# Patient Record
Sex: Female | Born: 1960 | Race: White | Hispanic: No | Marital: Single | Smoking: Former smoker
Health system: Southern US, Community
[De-identification: ages and names within clinical notes are randomized; demographics above are authoritative.]

## PROBLEM LIST (undated history)

## (undated) DIAGNOSIS — F419 Anxiety disorder, unspecified: Secondary | ICD-10-CM

## (undated) DIAGNOSIS — K8020 Calculus of gallbladder without cholecystitis without obstruction: Secondary | ICD-10-CM

## (undated) DIAGNOSIS — K219 Gastro-esophageal reflux disease without esophagitis: Secondary | ICD-10-CM

## (undated) DIAGNOSIS — F1910 Other psychoactive substance abuse, uncomplicated: Secondary | ICD-10-CM

## (undated) DIAGNOSIS — J449 Chronic obstructive pulmonary disease, unspecified: Secondary | ICD-10-CM

## (undated) DIAGNOSIS — Z87898 Personal history of other specified conditions: Secondary | ICD-10-CM

## (undated) DIAGNOSIS — F4310 Post-traumatic stress disorder, unspecified: Secondary | ICD-10-CM

## (undated) DIAGNOSIS — M797 Fibromyalgia: Secondary | ICD-10-CM

## (undated) DIAGNOSIS — D414 Neoplasm of uncertain behavior of bladder: Secondary | ICD-10-CM

## (undated) DIAGNOSIS — M25519 Pain in unspecified shoulder: Secondary | ICD-10-CM

## (undated) DIAGNOSIS — N200 Calculus of kidney: Secondary | ICD-10-CM

## (undated) DIAGNOSIS — B009 Herpesviral infection, unspecified: Secondary | ICD-10-CM

## (undated) DIAGNOSIS — F4000 Agoraphobia, unspecified: Secondary | ICD-10-CM

## (undated) DIAGNOSIS — J439 Emphysema, unspecified: Secondary | ICD-10-CM

## (undated) DIAGNOSIS — I499 Cardiac arrhythmia, unspecified: Secondary | ICD-10-CM

## (undated) DIAGNOSIS — I10 Essential (primary) hypertension: Secondary | ICD-10-CM

## (undated) DIAGNOSIS — I2510 Atherosclerotic heart disease of native coronary artery without angina pectoris: Secondary | ICD-10-CM

## (undated) DIAGNOSIS — F32A Depression, unspecified: Secondary | ICD-10-CM

## (undated) DIAGNOSIS — M1990 Unspecified osteoarthritis, unspecified site: Secondary | ICD-10-CM

## (undated) DIAGNOSIS — K635 Polyp of colon: Secondary | ICD-10-CM

## (undated) HISTORY — DX: Pain in unspecified shoulder: M25.519

## (undated) HISTORY — DX: Essential (primary) hypertension: I10

## (undated) HISTORY — DX: Polyp of colon: K63.5

## (undated) HISTORY — DX: Chronic obstructive pulmonary disease, unspecified: J44.9

## (undated) HISTORY — DX: Atherosclerotic heart disease of native coronary artery without angina pectoris: I25.10

## (undated) HISTORY — DX: Unspecified osteoarthritis, unspecified site: M19.90

## (undated) HISTORY — DX: Anxiety disorder, unspecified: F41.9

## (undated) HISTORY — DX: Calculus of kidney: N20.0

## (undated) HISTORY — DX: Agoraphobia, unspecified: F40.00

## (undated) HISTORY — PX: APPENDECTOMY: SHX54

## (undated) HISTORY — DX: Emphysema, unspecified: J43.9

## (undated) HISTORY — DX: Gastro-esophageal reflux disease without esophagitis: K21.9

## (undated) HISTORY — DX: Post-traumatic stress disorder, unspecified: F43.10

## (undated) HISTORY — PX: URETHRAL STRICTURE DILATATION: SHX477

## (undated) HISTORY — DX: Depression, unspecified: F32.A

## (undated) HISTORY — PX: CHOLECYSTECTOMY: SHX55

## (undated) HISTORY — DX: Neoplasm of uncertain behavior of bladder: D41.4

## (undated) HISTORY — PX: WRIST FRACTURE SURGERY: SHX121

## (undated) HISTORY — PX: OTHER SURGICAL HISTORY: SHX169

## (undated) HISTORY — DX: Calculus of gallbladder without cholecystitis without obstruction: K80.20

## (undated) HISTORY — DX: Other psychoactive substance abuse, uncomplicated: F19.10

## (undated) HISTORY — DX: Fibromyalgia: M79.7

---
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
## 2006-12-24 ENCOUNTER — Encounter: Payer: Self-pay | Admitting: Cardiology

## 2007-01-02 ENCOUNTER — Ambulatory Visit: Payer: Self-pay | Admitting: Cardiology

## 2007-05-08 ENCOUNTER — Encounter: Payer: Self-pay | Admitting: Cardiology

## 2008-06-16 ENCOUNTER — Encounter: Payer: Self-pay | Admitting: Cardiology

## 2008-09-06 ENCOUNTER — Ambulatory Visit: Payer: Self-pay | Admitting: Cardiology

## 2008-09-20 ENCOUNTER — Ambulatory Visit: Payer: Self-pay | Admitting: Cardiology

## 2008-09-20 ENCOUNTER — Encounter: Payer: Self-pay | Admitting: Cardiology

## 2009-07-12 ENCOUNTER — Encounter
Admission: RE | Admit: 2009-07-12 | Discharge: 2009-07-12 | Payer: Self-pay | Admitting: Physical Medicine & Rehabilitation

## 2010-01-13 ENCOUNTER — Encounter: Payer: Self-pay | Admitting: Cardiology

## 2010-03-14 ENCOUNTER — Encounter: Payer: Self-pay | Admitting: Cardiology

## 2010-06-15 ENCOUNTER — Encounter (INDEPENDENT_AMBULATORY_CARE_PROVIDER_SITE_OTHER): Payer: Self-pay | Admitting: *Deleted

## 2010-06-15 ENCOUNTER — Ambulatory Visit: Payer: Self-pay | Admitting: Cardiology

## 2010-06-15 ENCOUNTER — Encounter: Payer: Self-pay | Admitting: Physician Assistant

## 2010-06-18 ENCOUNTER — Telehealth (INDEPENDENT_AMBULATORY_CARE_PROVIDER_SITE_OTHER): Payer: Self-pay | Admitting: *Deleted

## 2010-07-31 ENCOUNTER — Telehealth (INDEPENDENT_AMBULATORY_CARE_PROVIDER_SITE_OTHER): Payer: Self-pay | Admitting: *Deleted

## 2010-12-25 NOTE — Progress Notes (Signed)
Summary: Rescheduled cath  Phone Note Call from Patient Call back at [facility] Phone [phone]   Summary of Call: Pt scheduled for cath on 7/28. Pt called the office today requesting that cath be moved to September. She states she needs time to arrange transportation and save up some money. Pt's cath rescheduled to August 02, 2010 at 9am with Dr. Excell Seltzer. She is aware to arrive at 7am and follow the same instructions she was given at office visit last week.  Initial call taken by: Cyril Loosen, RN, BSN,  June 18, 2010 10:51 AM

## 2010-12-25 NOTE — Assessment & Plan Note (Signed)
Summary: EST-LAST SEEN 2009   Visit Type:  Follow-up Primary Provider:  Dr. Kirstie Peri   History of Present Illness: 50 year old woman presents for followup. She was last seen in the office back in 2009.  Patient presents for evaluation of chest pain. She was seen by [place] on one previous occasion, in October 2009, per Dr. Margaretmary Eddy request, for evaluation of chest pain. We deemed this to be atypical. In the context of multiple cardiac risk factors, we referred her for an adenosine stress Cardiolite test. This was reviewed by Dr. Myrtis Ser, and found to be normal; EF 53%.  Patient presents with cardiac risk factors notable for long-standing tobacco smoking, hypertension, and family history.  Of note, patient is followed regularly by a psychiatrist, for treatment of anxiety disorder. She is a difficult historian, and does not present with a clear and definitive history of her chest pain. She denies any strict correlation of these symptoms with activity or moderate exertion. She also has reflux disease, and is on a proton pump inhibitor. There is some suggestion that her symptoms have improved with this medication.  A 12-lead electrocardiogram office today indicates normal sinus rhythm at 61 bpm with normal axis and no ischemic changes.  Preventive Screening-Counseling & Management  Alcohol-Tobacco     Smoking Status: current     Smoking Cessation Counseling: yes     Packs/Day: 1 PPD  Current Medications (verified): 1)  Protonix 40 Mg Tbec (Pantoprazole Sodium) .... Take 1 Tablet By Mouth Two Times A Day 2)  Verapamil Hcl Cr 300 Mg Xr24h-Cap (Verapamil Hcl) .... Take 1 Tablet By Mouth Once A Day 3)  Aspir-Low 81 Mg Tbec (Aspirin) .... Take 1 Tablet By Mouth Once A Day 4)  Nexium 40 Mg Cpdr (Esomeprazole Magnesium) .... Take 1 Tablet By Mouth Once A Day 5)  Gelnique 10 % Gel (Oxybutynin Chloride) .... Apply Topically Daily 6)  Flavoxate Hcl 100 Mg Tabs (Flavoxate Hcl) .... Take 1 Tablet By Mouth  Three Times A Day 7)  Valacyclovir Hcl 1 Gm Tabs (Valacyclovir Hcl) .... Take 1 Tablet By Mouth Once A Day 8)  Tylenol Extra Strength 500 Mg Tabs (Acetaminophen) .... As Needed 9)  Vicodin 5-500 Mg Tabs (Hydrocodone-Acetaminophen) .... As Needed 10)  Alprazolam 1 Mg Tabs (Alprazolam) .... Take 1 Tablet By Mouth Four Times A Day 11)  Ventolin Hfa 108 (90 Base) Mcg/act Aers (Albuterol Sulfate) .... As Needed 12)  Ranitidine Hcl 150 Mg Tabs (Ranitidine Hcl) .... Take 1 Tablet By Mouth Two Times A Day 13)  Nystatin 100000 Unit/gm Oint (Nystatin) .... Apply Topically Three Times A Day 14)  Clobetasol Propionate 0.05 % Crea (Clobetasol Propionate) .... Apply Topically Two Times A Day 15)  Azo-Cranberry 450 Mg Tabs (Cranberry) .... As Needed 16)  Depo-Provera 150 Mg/ml Susp (Medroxyprogesterone Acetate) .... One Injection Every 3 Months  Allergies (verified): 1)  ! Codeine 2)  ! Advil  Comments:  Nurse/Medical Assistant: The patient's medication bottles and allergies were reviewed with the patient and were updated in the Medication and Allergy Lists.  Past History:  Past Surgical History: Last updated: 04/16/2010 Bilateral tubal ligation Appendectomy Cholecystectomy  Family History: Last updated: 04/16/2010 Family History of Coronary Artery Disease: mother  Social History: Last updated: 04/16/2010 Tobacco Use - Yes Alcohol Use - no Drug Use - no  Past Medical History: Hypertension G E R D Bladder polyps Nephrolithiasis Atypical chest pain Anxiety disorder   Social History: Packs/Day:  1 PPD  Review of Systems  No fevers, chills, hemoptysis, dysphagia, melena, hematocheezia, hematuria, rash, claudication, orthopnea, pnd, pedal edema. She also complains of swelling of her "lymph nodes", and "veins swelling up in chest". She also complains of upper and lower extremity tingling, and recurrent headaches. All other systems reviewed, and are negative.   Vital  Signs:  Patient profile:   50 year old female Height:      58 inches Weight:      119 pounds BMI:     24.96 Pulse rate:   66 / minute BP sitting:   139 / 84  (left arm) Cuff size:   regular  Vitals Entered By: Carlye Grippe (June 15, 2010 1:06 PM)  Physical Exam  Additional Exam:  GEN: 51 year old female, sitting upright, no distress HEENT: NCAT,PERRLA,EOMI NECK: palpable pulses, no bruits; no JVD; no TM LUNGS: CTA bilaterally HEART: RRR (S1S2); no significant murmurs; no rubs; no gallops ABD: soft, NT; intact BS EXT: intact distal pulses; no edema SKIN: warm, dry MUSC: no obvious deformity NEURO: A/O (x3)     Nuclear Study  Procedure date:  09/20/2008  Findings:      Adenosine Cardiolite:  No diagnostic ST segment changes. No evidence of scar or ischemia by perfusion imaging. LVEF 53%.  Nuclear Study  Procedure date:  06/15/2010  Findings:      normal sinus rhythm at 61 bpm; normal axis; no ischemic changes  Impression & Recommendations:  Problem # 1:  CHEST PAIN (ICD-786.50)  Patient presents with complaint of chest pain, but is a very vague and difficult historian. She was previously evaluated by [place] on one occasion, back in 2009, at which time we deemed her symptoms to be atypical. In the context of numerous risk factors, however, we did refer her for an adenosine Cardiolite, reviewed by Dr. Myrtis Ser, which showed normal perfusion; EF 53%. She now returns with complaint of chest pain, and states that she has had subsequent stress tests since her last visit with [place]. We discussed the option of a repeat stress test versus a diagnostic cardiac catheterization, and she opted for the latter. We explained the risks of the procedure, and she has elected to proceed. The patient was also seen and evaluated by Dr. Diona Browner, who agreed with this recommendation to proceed. 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 She also does not drive,  and transportation resources are limited. We plan on arranging this procedure to be done in the main catheterization lab, in the event that she will require percutaneous intervention. She will continue on current medication regimen, which includes low-dose aspirin. Will plan on having her return to the clinic following the procedure, for review of the study results and further recommendations.  Problem # 2:  HYPERTENSION, UNSPECIFIED (ICD-401.9) Assessment: Comment Only  Problem # 3:  TOBACCO ABUSE (ICD-305.1)  smoking cessation was advised.  Other Orders: EKG w/ Interpretation (93000) T-Basic Metabolic Panel (16109-60454) T-CBC No Diff (09811-91478) T-PTT (29562-13086) T-Protime, Auto (57846-96295) T-Chest x-ray, 2 views (28413) Cardiac Catheterization (Cardiac Cath)  Patient Instructions: 1)  Your physician has requested that you have a cardiac catheterization.  Cardiac catheterization is used to diagnose and/or treat various heart conditions. Doctors may recommend this procedure for a number of different reasons. The most common reason is to evaluate chest pain. Chest pain can be a symptom of coronary artery disease (CAD), and cardiac catheterization can show whether plaque is narrowing or blocking your heart's arteries. This procedure is also used to evaluate the  valves, as well as measure the blood flow and oxygen levels in different parts of your heart.  For further information please visit [email or web address]  Please follow instruction sheet, as given. 2)  Your physician recommends that you go to the [facility] for lab work and a chest x-ray: DO TODAY! 3)  You need to take the following medications at 6pm on 6/27, MN and 6am on 6/28: Prednisone 20mg  3 tablets, Benadryl 25mg , and Pepcid (famotidine) 20mg . This is very important because of your shrimp allergy. Prescriptions: PREDNISONE 20 MG TABS (PREDNISONE) Take 3 tablets at 6pm (6/27), MN and 6am (6/28) before procedure as directed   #9 x 0   Entered by:   Cyril Loosen, RN, BSN   Authorized by:   Nelida Meuse, PA-C   Signed by:   Cyril Loosen, RN, BSN on 06/15/2010   Method used:   Electronically to        [facility]* (retail)       [address]       [place], [place]  [address]       Ph: [phone]       Fax: [phone]   RxID:   [id number]   Appended Document: EST-LAST SEEN 2009 Patient seen with Mr. Victoria Townsend.  Symptoms somewhat atypical for angina, although with cardiac risk factors including longstanding tobacco abuse, family history, and HTN (lipid status not certain).  Prior noninvasive testing was reassuring, but symptoms continue and she remains very concerned about her heart.  We discussed the options for further evaluation, including the risks (including but not limited to bleeding/MI/stroke/death) and potential benefits of a diagnostic cardiac catherization to clearly define her coronary anatomy and assess for any symptomatic disease that might warrant intervention beyond medical therapy and risk factor modification.  She would like to proceed with a cardiac catherization after considering the matter, and this will be scheduled as an outpatient at [facility] next week.  Smoking cessation was discussed today as well.  Appended Document: EST-LAST SEEN 2009 ADDENDUM:   Patient rescheduled her previous appointment for a cardiac catheterization, and is now scheduled to present on Thursday, 9/8. Please refer to my complete note of 06/15/2010, for complete details.

## 2010-12-25 NOTE — Letter (Signed)
Summary: Cardiac Cath Instructions - Main Lab  [facility] [address]   [place], [place] [address]   Phone: [phone]  Fax: [phone]     06/15/2010 MRN: [id number]  Victoria Townsend [address]  [address] [place], [place]  [address]  Dear Victoria Townsend,   You are scheduled for Cardiac Catheterization on Thursday, June 21, 2010   with Dr. Excell Seltzer.   Please arrive at the [facility] at 0700 am      on the day of your procedure.  1. DIET     __X__ Nothing to eat or drink after midnight except your medications with a sip of water.  2. Come to the [place] office on             for lab work.  The lab at [facility] is open from 8:30 a.m. to 1:30 p.m. and 2:30 p.m. to 5:00 p.m.  The lab at [facility] is open from 7:30 a.m. to 5:30 p.m.  You do not have to be fasting.  3. MAKE SURE YOU TAKE YOUR ASPIRIN.  4. _____ DO NOT TAKE these medications before your procedure:     5. ___X__ YOU MAY TAKE ALL of your remaining medications with a small amount of water.  6._____ START NEW medications:  7.__X___ Pre-med instructions: Take Prednisone 20mg  (3 tablets), Pepcid 20mg , Benadry 25mg  at 6pm, midnight and 6am before your cath.       8. Plan for one night stay - bring personal belongings (i.e. toothpaste, toothbrush, etc.)  9. Bring a current list of your medications and current insurance cards.  10.Must have a responsible person to drive you home.   11.Someone must be with you for the first 24 hours after you arrive home.  9. Please wear clothes that are easy to get on and off and wear slip-on shoes.  *Special note: Every effort is made to have your procedure done on time.  Occasionally there are emergencies that present themselves at the hospital that may cause delays.  Please be patient if a delay does occur.  If you have any questions after you get home, please call the office at the number listed  above.  Victoria Loosen, RN, BSN

## 2010-12-25 NOTE — Letter (Signed)
Summary: [facility]-OFFICE NOTE  E[facility]-OFFICE NOTE   Imported By: Claudette Laws 03/15/2010 10:06:04  _____________________________________________________________________  External Attachment:    Type:   Image     Comment:   External Document

## 2010-12-25 NOTE — Letter (Signed)
Summary: Internal Correspondence/ FAXED OUTPATIENT CATH ORDER  Internal Correspondence/ FAXED OUTPATIENT CATH ORDER   Imported By: Dorise Hiss 08/07/2010 11:02:55  _____________________________________________________________________  External Attachment:    Type:   Image     Comment:   External Document

## 2010-12-25 NOTE — Progress Notes (Signed)
Summary: cath  Phone Note Outgoing Call   Summary of Call: Called pt Call placed by: Cyril Loosen, RN, BSN,  July 31, 2010 11:41 AM Summary of Call: Called pt at contact number in EMR/IDX to remind pt of cath scheduled for Thursday, Sept 8th. Female answered phone and stated it was the wrong number. Initial call taken by: Cyril Loosen, RN, BSN,  July 31, 2010 11:42 AM     Appended Document: cath Pt called the office stating she thinks her cath was scheduled for today, but she wasn't sure. She states she does not have transportation right now anyway and is having some other problems. She wants to cancel the cath. She states she's feeling okay right now. She states if she has any problems in the future she guesses she'll just end up in the hospital and maybe someone can help her then. Notified pt to contact office if she decides to have cath done.

## 2011-02-05 DIAGNOSIS — R079 Chest pain, unspecified: Secondary | ICD-10-CM

## 2011-02-24 HISTORY — PX: ESOPHAGOGASTRODUODENOSCOPY: SHX1529

## 2011-03-26 HISTORY — PX: COLONOSCOPY: SHX174

## 2011-04-09 NOTE — Assessment & Plan Note (Signed)
[facility]                          [facility] OFFICE NOTE   TERIANN, LIVINGOOD                        MRN:          [id number]  DATE:09/06/2008                            DOB:          Apr 29, 1961    REFERRING PHYSICIAN:  Kirstie Peri, MD   REASON FOR CONSULTATION:  Chest pain.   HISTORY OF PRESENT ILLNESS:  Ms. Nolting is a 50 year old woman with a  longstanding history of tobacco abuse and hypertension who was referred  by Dr. Sherryll Burger for the assessment of chest pain.  She actually missed her  initial visit with [place] and was rescheduled.  She reports fairly sporadic  sharp or stinging chest pain occurring either on the left or right  side of her chest and without any known precipitant.  Sometimes this  happens in the middle of the night, although it can happen during the  day as well.  These symptoms are not exertional in nature.  She reports  being compliant with her medications which include a proton pump  inhibitor and long-acting verapamil.  She has undergone previous  ischemic evaluation for similar symptoms according to her report.  She  had a myocardial perfusion study approximately a year and half ago that  demonstrated no frank ischemia with normal ejection fraction of 66%.  More recently, she underwent an echocardiogram through [facility] in August demonstrating normal left ventricular systolic  function with no valvular abnormalities and no segmental wall motion  abnormalities.  The aortic arch was described as normal as well.  Her  electrocardiogram demonstrates sinus rhythm with a very small R prime in  lead V1 and V2.  She was concerned about the status of her heart and was  referred to discuss the matter.   ALLERGIES:  Codeine.   PRESENT MEDICATIONS:  1. Aspirin 81 mg p.o. daily.  2. Omeprazole 20 mg p.o. b.i.d.  3. Verapamil SR 240 mg p.o. daily.  4. Gelnique daily.  5. Cipro 500 mg p.o. b.i.d.  6. Flavoxate 100  mg p.o. t.i.d.  7. Valtrex 500 mg 2 tablets p.o. daily.  8. Depo-Provera shots every 3 months.  9. She also on an as-needed basis uses Tylenol, hydrocodone, Xanax,      and albuterol.   PAST MEDICAL HISTORY:  Detailed above.  She reports problems with  polyps in the bladder, urinary frequency, kidney stones, kidney  cysts.  She is status post bilateral tubal ligation, appendectomy, and  cholecystectomy.  Denies any clear history of coronary artery disease or  myocardial infarction.  No dysrhythmias.   REVIEW OF SYSTEMS:  Ms. Barbee denies any palpitations, orthopnea, or  PND.  She has chronic leg pain which is nonexertional.  Also has lower  back pain, increased urinary frequency.  No fevers or chills.  Appetite  has been stable.  No melena or hematochezia.  Otherwise systems are  negative.   SOCIAL HISTORY:  The patient has smoked cigarettes since the age of 1.  She describes herself as a chain smoker.  Denies any alcohol or other  illicit substances.  FAMILY HISTORY:  Reviewed significant for coronary artery disease in her  mother who underwent coronary artery bypass grafting.  Father's history  is unknown.  All siblings described as living without any major  cardiovascular disease.   PHYSICAL EXAMINATION:  VITAL SIGNS:  Blood pressure 132/90, heart rate  is 89, weight is 139 pounds.  GENERAL:  Somewhat disheveled woman in no acute distress, short stature.  HEENT:  Conjunctiva is normal.  Pharynx clear.  NECK:  Supple.  No elevated jugular venous pressure.  No loud carotid  bruits or thyromegaly.  LUNGS:  Clear with diminished breath sounds throughout.  No wheezing or  labored breathing.  CARDIAC:  Regular rate and rhythm.  No pathologic murmurs.  No  pericardial rub, S3, or gallop.  ABDOMEN:  Soft, nontender, normoactive bowel sounds.  No bruits.  EXTREMITIES:  Exhibit 1+ pulses.  No frank pitting edema.  MUSCULOSKELETAL:  No kyphosis noted.  NEUROPSYCHIATRIC:  The  patient is alert and oriented x3.   IMPRESSION AND RECOMMENDATIONS:  Atypical chest pain in a 50 year old  woman with longstanding history of tobacco abuse and hypertension.  Lipid status is unknown at this time.  Her resting electrocardiogram is  rather nonspecific and she had a reassuring ischemic evaluation  approximately 1-1/2 years ago.  She remains very concerned about her  heart and in light of her ongoing risk factor profile, we will arrange a  followup adenosine/low-level ambulation Cardiolite on medical therapy.  If this is low risk, I would not anticipate any further cardiac  evaluation and suggest basic risk factor modification strategies.  She  clearly needs to stop smoking and we talked about this today.  It may  also be reasonable to up titrate her verapamil ER to obtain better blood  pressure and heart rate control.  We will inform her of the test results  by phone and can bring her back as needed depending on need for further  assessment.     Jonelle Sidle, MD  Electronically Signed    SGM/MedQ  DD: 09/06/2008  DT: 09/07/2008  Job #: [id number]   cc:   Kirstie Peri, MD

## 2011-05-30 ENCOUNTER — Encounter: Payer: Self-pay | Admitting: Physician Assistant

## 2012-01-12 ENCOUNTER — Encounter: Payer: Self-pay | Admitting: Cardiology

## 2012-01-12 DIAGNOSIS — K219 Gastro-esophageal reflux disease without esophagitis: Secondary | ICD-10-CM | POA: Insufficient documentation

## 2012-01-12 DIAGNOSIS — M25519 Pain in unspecified shoulder: Secondary | ICD-10-CM | POA: Insufficient documentation

## 2012-01-12 DIAGNOSIS — Z720 Tobacco use: Secondary | ICD-10-CM | POA: Insufficient documentation

## 2012-01-12 DIAGNOSIS — N200 Calculus of kidney: Secondary | ICD-10-CM | POA: Insufficient documentation

## 2012-01-12 DIAGNOSIS — F17210 Nicotine dependence, cigarettes, uncomplicated: Secondary | ICD-10-CM | POA: Insufficient documentation

## 2012-01-12 DIAGNOSIS — R0789 Other chest pain: Secondary | ICD-10-CM | POA: Insufficient documentation

## 2012-01-12 DIAGNOSIS — I10 Essential (primary) hypertension: Secondary | ICD-10-CM | POA: Insufficient documentation

## 2012-01-12 DIAGNOSIS — R9430 Abnormal result of cardiovascular function study, unspecified: Secondary | ICD-10-CM | POA: Insufficient documentation

## 2012-01-12 DIAGNOSIS — J45909 Unspecified asthma, uncomplicated: Secondary | ICD-10-CM | POA: Insufficient documentation

## 2012-01-12 DIAGNOSIS — F1910 Other psychoactive substance abuse, uncomplicated: Secondary | ICD-10-CM | POA: Insufficient documentation

## 2012-01-12 DIAGNOSIS — F419 Anxiety disorder, unspecified: Secondary | ICD-10-CM | POA: Insufficient documentation

## 2012-01-13 ENCOUNTER — Ambulatory Visit: Payer: Medicaid Other | Admitting: Cardiology

## 2012-09-30 DIAGNOSIS — R079 Chest pain, unspecified: Secondary | ICD-10-CM

## 2012-10-01 DIAGNOSIS — R072 Precordial pain: Secondary | ICD-10-CM

## 2013-03-29 ENCOUNTER — Encounter: Payer: Self-pay | Admitting: Gastroenterology

## 2013-04-12 ENCOUNTER — Ambulatory Visit (INDEPENDENT_AMBULATORY_CARE_PROVIDER_SITE_OTHER): Payer: Medicaid Other | Admitting: Gastroenterology

## 2013-04-12 ENCOUNTER — Encounter: Payer: Self-pay | Admitting: Gastroenterology

## 2013-04-12 VITALS — BP 147/70 | HR 71 | Temp 98.2°F | Ht <= 58 in | Wt 123.0 lb

## 2013-04-12 DIAGNOSIS — R109 Unspecified abdominal pain: Secondary | ICD-10-CM | POA: Insufficient documentation

## 2013-04-12 DIAGNOSIS — K5900 Constipation, unspecified: Secondary | ICD-10-CM | POA: Insufficient documentation

## 2013-04-12 DIAGNOSIS — K219 Gastro-esophageal reflux disease without esophagitis: Secondary | ICD-10-CM

## 2013-04-12 MED ORDER — DEXLANSOPRAZOLE 60 MG PO CPDR: 60.0000 mg | DELAYED_RELEASE_CAPSULE | Freq: Two times a day (BID) | ORAL | Status: DC

## 2013-04-12 MED ORDER — LUBIPROSTONE 24 MCG PO CAPS: 24.0000 ug | ORAL_CAPSULE | Freq: Two times a day (BID) | ORAL | Status: DC

## 2013-04-12 NOTE — Assessment & Plan Note (Signed)
52 year old female with chronic abdominal pain, located diffusely upper abdomen and periumbilical, intermittent, possible association with eating. No melena, use of NSAIDs, or aspirin powders. Severe GERD noted, with only improvement described with Protonix BID AND Nexium daily. Gallbladder absent, with last EGD a few years ago per her report. Need outside labs, any imaging studies, last EGD report prior to further recommendations. May ultimately need repeat EGD. For now, trial of Dexilant BID. Return in 6 weeks. Attempt to obtain all outside records in interim.

## 2013-04-12 NOTE — Assessment & Plan Note (Signed)
May be contributing to abdominal discomfort. Start Amitiza 24 mcg po BID. Obtain last TCS reports for our records. Scant hematochezia in the setting of constipation likely benign anorectal source. 6 week return.

## 2013-04-12 NOTE — Assessment & Plan Note (Signed)
Trial of Dexilant BID. 6 week f/u. GERD diet provided.

## 2013-04-12 NOTE — Patient Instructions (Addendum)
For reflux: Stop Protonix and Nexium. Start taking Dexilant twice a day. I have provided samples and sent a prescription to your pharmacy. Review the reflux diet provided.  For constipation: Start taking Amitiza 1 capsule WITH FOOD each night for three nights. IF you do well with this, increase to TWICE A DAY WITH FOOD. I have provided samples and sent a prescription to your pharmacy.  I will be requesting records regarding prior procedures such as a colonoscopy and upper endoscopy.  We will see you back in 6 weeks.   Diet for Gastroesophageal Reflux Disease, Adult Reflux (acid reflux) is when acid from your stomach flows up into the esophagus. When acid comes in contact with the esophagus, the acid causes irritation and soreness (inflammation) in the esophagus. When reflux happens often or so severely that it causes damage to the esophagus, it is called gastroesophageal reflux disease (GERD). Nutrition therapy can help ease the discomfort of GERD. FOODS OR DRINKS TO AVOID OR LIMIT  Smoking or chewing tobacco. Nicotine is one of the most potent stimulants to acid production in the gastrointestinal tract.  Caffeinated and decaffeinated coffee and black tea.  Regular or low-calorie carbonated beverages or energy drinks (caffeine-free carbonated beverages are allowed).   Strong spices, such as black pepper, white pepper, red pepper, cayenne, curry powder, and chili powder.  Peppermint or spearmint.  Chocolate.  High-fat foods, including meats and fried foods. Extra added fats including oils, butter, salad dressings, and nuts. Limit these to less than 8 tsp per day.  Fruits and vegetables if they are not tolerated, such as citrus fruits or tomatoes.  Alcohol.  Any food that seems to aggravate your condition. If you have questions regarding your diet, call your caregiver or a registered dietitian. OTHER THINGS THAT MAY HELP GERD INCLUDE:   Eating your meals slowly, in a relaxed  setting.  Eating 5 to 6 small meals per day instead of 3 large meals.  Eliminating food for a period of time if it causes distress.  Not lying down until 3 hours after eating a meal.  Keeping the head of your bed raised 6 to 9 inches (15 to 23 cm) by using a foam wedge or blocks under the legs of the bed. Lying flat may make symptoms worse.  Being physically active. Weight loss may be helpful in reducing reflux in overweight or obese adults.  Wear loose fitting clothing EXAMPLE MEAL PLAN This meal plan is approximately 2,000 calories based on https://www.bernard.org/ meal planning guidelines. Breakfast   cup cooked oatmeal.  1 cup strawberries.  1 cup low-fat milk.  1 oz almonds. Snack  1 cup cucumber slices.  6 oz yogurt (made from low-fat or fat-free milk). Lunch  2 slice whole-wheat bread.  2 oz sliced [place].  2 tsp mayonnaise.  1 cup blueberries.  1 cup snap peas. Snack  6 whole-wheat crackers.  1 oz string cheese. Dinner   cup brown rice.  1 cup mixed veggies.  1 tsp olive oil.  3 oz grilled fish. Document Released: 11/11/2005 Document Revised: 02/03/2012 Document Reviewed: 09/27/2011 [facility] Patient Information 2013 [place], [place].

## 2013-04-12 NOTE — Progress Notes (Signed)
Cc PCP 

## 2013-04-12 NOTE — Progress Notes (Addendum)
Primary Care Physician:  Kirstie Peri, MD Primary Gastroenterologist:  Dr. Jena Gauss  Chief Complaint  Patient presents with  . Abdominal Pain    HPI:   52 year old eccentric female presents today at the request of Dr. Sherryll Burger secondary to abdominal pain. It is quite difficult to keep her on track. Notes multiple complaints today, many of them non-GI related. Continues to have "heart attack pain" but negative cardiac work-up. Chronic abdominal pain X 1 year. Feels like it is getting worse. Upper abdominal pain diffusely, periumbilical pain within the last 4 months. Tenderness/soreness upper abdomen, feels like "something sitting there". Intermittent. Feels like she overeats sometimes and becomes bloated, worsens reflux. On Nexium daily, AND Protonix BID. Eating aggravates abdominal discomfort. Vague nausea, no vomiting. No melena. Scant hematochezia with constipation. Fiber for constipation. OTC agents do not help. "balls" of stool. Last cocaine about 4 months ago.  Significant psychological history, sees [facility]. Reportedly, she had a TCS/EGD by Dr. Allena Katz a few years ago. Records not available at time of this encounter.    Past Medical History  Diagnosis Date  . GERD (gastroesophageal reflux disease)   . Hypertension   . Bladder polyps   . Nephrolithiasis   . Chest pain     Nuclear, 2009, no ischemia, EF 53%  . Anxiety disorder     Historically has seen a psychiatrist  . Ejection fraction     EF 53%, nuclear, 2009  . Tobacco abuse   . Asthma   . Shoulder pain     Shoulder has been injected  . Polysubstance abuse     cocaine, marijuana  . Agoraphobia   . PTSD (post-traumatic stress disorder)     Past Surgical History  Procedure Laterality Date  . Bilateral tubal ligation    . Appendectomy    . Cholecystectomy    . Bladder polyps    . Urethral stricture dilatation      Current Outpatient Prescriptions  Medication Sig Dispense Refill  . acetaminophen (TYLENOL) 500 MG tablet  Take 500 mg by mouth every 6 (six) hours as needed.        Marland Kitchen albuterol (VENTOLIN HFA) 108 (90 BASE) MCG/ACT inhaler Inhale 2 puffs into the lungs every 6 (six) hours as needed.        . ALPRAZolam (XANAX) 1 MG tablet Take 1 mg by mouth 4 (four) times daily.        Marland Kitchen aspirin (ASPIR-LOW) 81 MG EC tablet Take 81 mg by mouth daily.        . clobetasol (TEMOVATE) 0.05 % cream Apply topically 2 (two) times daily.        . Cranberry (AZO-CRANBERRY) 450 MG TABS Take by mouth as needed.        Marland Kitchen esomeprazole (NEXIUM) 40 MG packet Take 40 mg by mouth daily before breakfast.        . flavoxATE (URISPAS) 100 MG tablet Take 100 mg by mouth 3 (three) times daily as needed.        . nystatin (MYCOSTATIN) ointment Apply topically 3 (three) times daily.        . Oxybutynin Chloride (GELNIQUE) 10 % GEL Place onto the skin daily.        . pantoprazole (PROTONIX) 40 MG tablet Take 40 mg by mouth 2 (two) times daily.        . polyethylene glycol (MIRALAX / GLYCOLAX) packet Take 17 g by mouth daily.      . predniSONE (STERAPRED UNI-PAK) 5 MG TABS Take 5  mg by mouth daily.      . valACYclovir (VALTREX) 1000 MG tablet Take 1,000 mg by mouth daily.        . Verapamil HCl CR 300 MG CP24 Take 1 capsule by mouth daily.        Marland Kitchen dexlansoprazole (DEXILANT) 60 MG capsule Take 1 capsule (60 mg total) by mouth 2 (two) times daily.  60 capsule  3  . lubiprostone (AMITIZA) 24 MCG capsule Take 1 capsule (24 mcg total) by mouth 2 (two) times daily with a meal.  60 capsule  3   No current facility-administered medications for this visit.    Allergies as of 04/12/2013 - Review Complete 04/12/2013  Allergen Reaction Noted  . Ciprofloxacin Swelling 04/12/2013  . Codeine    . Ibuprofen    . Shellfish allergy Rash 04/12/2013    Family History  Problem Relation Age of Onset  . Coronary artery disease Mother   . Colon cancer Paternal Uncle     History   Social History  . Marital Status: Divorced    Spouse Name: N/A     Number of Children: N/A  . Years of Education: N/A   Occupational History  . Not on file.   Social History Main Topics  . Smoking status: Current Some Day Smoker -- 0.50 packs/day    Types: Cigarettes  . Smokeless tobacco: Not on file  . Alcohol Use: No     Comment: former user  . Drug Use: Yes    Special: Marijuana, Cocaine     Comment: last cocaine about 4 months ago; actively smoking marijuana  . Sexually Active: Not on file   Other Topics Concern  . Not on file   Social History Narrative  . No narrative on file    Review of Systems: Negative unless mentioned in HPI  Physical Exam: BP 147/70  Pulse 71  Temp(Src) 98.2 F (36.8 C) (Oral)  Ht 4\' 10"  (1.473 m)  Wt 123 lb (55.792 kg)  BMI 25.71 kg/m2 General:   Alert and oriented. Pleasant and cooperative. Well-nourished and well-developed.  Head:  Normocephalic and atraumatic. Eyes:  Without icterus, sclera clear and conjunctiva pink.  Ears:  Normal auditory acuity. Nose:  No deformity, discharge,  or lesions. Mouth:  No deformity or lesions, oral mucosa pink.  Neck:  Supple, without mass or thyromegaly. Lungs:  Clear to auscultation bilaterally. No wheezes, rales, or rhonchi. No distress.  Heart:  S1, S2 present without murmurs appreciated.  Abdomen:  +BS, soft, non-tender and non-distended. No HSM noted. No guarding or rebound. No masses appreciated.  Rectal:  Deferred  Msk:  Symmetrical without gross deformities. Normal posture. Extremities:  Without clubbing or edema. Neurologic:  Alert and  oriented x4;  grossly normal neurologically. Skin:  Intact without significant lesions or rashes. Cervical Nodes:  No significant cervical adenopathy. Psych:  Alert and cooperative. Eccentric, talkative, hard to keep on track.

## 2013-04-15 ENCOUNTER — Telehealth: Payer: Self-pay | Admitting: Gastroenterology

## 2013-04-15 NOTE — Telephone Encounter (Signed)
Pt called this afternoon to say that she was not taking the medications that were prescribed to her. She read the side effects of Amitiza and Dexilant and said she was not going to take them. Pt is confused why Dr Sherryll Burger referred her to GI and said she didn't see why she needed to follow up. I told patient that I would note her concerns and it would be up to her if she wanted to follow up with [place].

## 2013-04-26 NOTE — Telephone Encounter (Signed)
Can we find out how patient is doing and if she has any further concerns.

## 2013-04-27 NOTE — Telephone Encounter (Signed)
Called pt. She is not taking the Dexilant or the Amitiza. She is taking the Protonix bid. She is not taking the Nexium. She said she has been doing well on the Protonix bid and not having a lot of reflux.   She is not taking the Amitiza because her stools got loose and also she is afraid of a lot of medications because of the side effects.   She is taking fiber and she has a BM ( sometimes 2) daily. She said she had a normal soft this AM and then had a very loose one.   She feels she is doing much better, because she was having to take laxatives daily to have a BM.   She is aware of her OV appt with Gerrit Halls, NP on 05/24/2013 at 11:00 Am and she plans to keep that appt.

## 2013-04-27 NOTE — Telephone Encounter (Signed)
Routing to Doris, this is a [facility] pt. 

## 2013-04-27 NOTE — Telephone Encounter (Signed)
Glad to hear. 

## 2013-05-05 ENCOUNTER — Encounter: Payer: Self-pay | Admitting: Gastroenterology

## 2013-05-05 NOTE — Progress Notes (Signed)
Last TCS 2012 by Dr. Allena Katz: pancolonic diverticula.

## 2013-05-06 ENCOUNTER — Encounter: Payer: Self-pay | Admitting: Gastroenterology

## 2013-05-24 ENCOUNTER — Ambulatory Visit (INDEPENDENT_AMBULATORY_CARE_PROVIDER_SITE_OTHER): Payer: Medicaid Other | Admitting: Gastroenterology

## 2013-05-24 ENCOUNTER — Encounter: Payer: Self-pay | Admitting: Gastroenterology

## 2013-05-24 VITALS — BP 133/71 | HR 58 | Temp 97.8°F | Ht 63.0 in | Wt 121.8 lb

## 2013-05-24 DIAGNOSIS — K5900 Constipation, unspecified: Secondary | ICD-10-CM

## 2013-05-24 DIAGNOSIS — K219 Gastro-esophageal reflux disease without esophagitis: Secondary | ICD-10-CM

## 2013-05-24 DIAGNOSIS — R1013 Epigastric pain: Secondary | ICD-10-CM

## 2013-05-24 MED ORDER — DIPHENHYDRAMINE HCL 25 MG PO TABS: ORAL_TABLET | ORAL | Status: DC

## 2013-05-24 MED ORDER — PREDNISONE 50 MG PO TABS: ORAL_TABLET | ORAL | Status: DC

## 2013-05-24 NOTE — Patient Instructions (Addendum)
Please have blood work done. We will call with results.  We have scheduled you for a CT scan to further evaluate your pain.  IMPORTANT: Take 1 tablet of Prednisone 13 hours before scan Take 1 tablet of Prednisone 7 hours before scan Take 1 tablet of Prednisone and 50 mg of Benadryl 1 hour before scan.    Further recommendations to follow.

## 2013-05-24 NOTE — Progress Notes (Signed)
Referring Provider: Kirstie Peri, MD Primary Care Physician:  Kirstie Peri, MD  Chief Complaint  Patient presents with  . Follow-up    abd pain    HPI:   52 year old female returns today in follow-up with a history of chronic abdominal pain, constipation, severe GERD. Started on Dexilant at last appointment. However, patient called several days later saying she was doing well on just Protonix BID. Taking fiber for constipation with good results.  Last TCS in 2012 by Dr. Allena Katz with pancolonic diverticula. EGD 2012 with gastritis.   Ketchup, mayonnaise, fish stick sandwiches, worsened constipation. Took fiber, felt like abdomen was swollen and distended. Took 2 blue laxative pills yesterday, drank coffee this morning, then started having bowel movements. Had small, hard bowel movements then "creamy runs" then more hard bowel movements.   Notes epigastric soreness, "like a vein or a knot", felt like it moved upwards 6 months later. Stings like a bumble bee epigastric region. Severe. Constant today. Eating too much worsens pain. Gallbladder absent. No melena. No NSAIDs. No dysphagia. No loss of appetite or weight loss. Reflux under better control.  No longer taking Nexium. Protonix BID. Feels like she still has breakthrough reflux.   Past Medical History  Diagnosis Date  . GERD (gastroesophageal reflux disease)   . Hypertension   . Bladder polyps   . Nephrolithiasis   . Chest pain     Nuclear, 2009, no ischemia, EF 53%  . Anxiety disorder     Historically has seen a psychiatrist  . Ejection fraction     EF 53%, nuclear, 2009  . Tobacco abuse   . Asthma   . Shoulder pain     Shoulder has been injected  . Polysubstance abuse     cocaine, marijuana  . Agoraphobia   . PTSD (post-traumatic stress disorder)     Past Surgical History  Procedure Laterality Date  . Bilateral tubal ligation    . Appendectomy    . Cholecystectomy    . Bladder polyps    . Urethral stricture dilatation     . Colonoscopy  May 2012    Dr. Allena Katz: scattered diverticula pancolonic  . Esophagogastroduodenoscopy  April 2012    Dr. Allena Katz: gastritis    Current Outpatient Prescriptions  Medication Sig Dispense Refill  . acetaminophen (TYLENOL) 500 MG tablet Take 500 mg by mouth every 6 (six) hours as needed.        Marland Kitchen albuterol (VENTOLIN HFA) 108 (90 BASE) MCG/ACT inhaler Inhale 2 puffs into the lungs every 6 (six) hours as needed.        . ALPRAZolam (XANAX) 1 MG tablet Take 1 mg by mouth 4 (four) times daily.        Marland Kitchen aspirin (ASPIR-LOW) 81 MG EC tablet Take 81 mg by mouth daily.        . clobetasol (TEMOVATE) 0.05 % cream Apply topically 2 (two) times daily.        . Cranberry (AZO-CRANBERRY) 450 MG TABS Take by mouth as needed.        . flavoxATE (URISPAS) 100 MG tablet Take 100 mg by mouth 3 (three) times daily as needed.        . nystatin (MYCOSTATIN) ointment Apply topically 3 (three) times daily.        . Oxybutynin Chloride (GELNIQUE) 10 % GEL Place onto the skin daily.        . pantoprazole (PROTONIX) 40 MG tablet Take 40 mg by mouth 2 (two) times daily.        Marland Kitchen  predniSONE (STERAPRED UNI-PAK) 5 MG TABS Take 5 mg by mouth daily.      . valACYclovir (VALTREX) 1000 MG tablet Take 1,000 mg by mouth daily.        . Verapamil HCl CR 300 MG CP24 Take 1 capsule by mouth daily.        . diphenhydrAMINE (BENADRYL) 25 MG tablet Take 2 tablets 1 hour before CT scan.  2 tablet  0  . predniSONE (DELTASONE) 50 MG tablet Take 1 tablet 13 hours before scan, 1 tablet 7 hours before, and 1 tablet 1 hour before CT scan.  3 tablet  0   No current facility-administered medications for this visit.    Allergies as of 05/24/2013 - Review Complete 05/24/2013  Allergen Reaction Noted  . Ciprofloxacin Swelling 04/12/2013  . Codeine    . Ibuprofen    . Shellfish allergy Rash 04/12/2013    Family History  Problem Relation Age of Onset  . Coronary artery disease Mother   . Colon cancer Paternal Uncle      History   Social History  . Marital Status: Divorced    Spouse Name: N/A    Number of Children: N/A  . Years of Education: N/A   Social History Main Topics  . Smoking status: Current Some Day Smoker -- 0.50 packs/day    Types: Cigarettes  . Smokeless tobacco: None  . Alcohol Use: No     Comment: former user  . Drug Use: Yes    Special: Marijuana, Cocaine     Comment: last cocaine about 4 months ago; actively smoking marijuana  . Sexually Active: None   Other Topics Concern  . None   Social History Narrative  . None    Review of Systems: Negative unless mentioned in HPI.  Physical Exam: BP 133/71  Pulse 58  Temp(Src) 97.8 F (36.6 C) (Oral)  Ht 5\' 3"  (1.6 m)  Wt 121 lb 12.8 oz (55.248 kg)  BMI 21.58 kg/m2 General:   Alert and oriented. No distress noted. Eccentric, very talkative Head:  Normocephalic and atraumatic. Eyes:  Conjuctiva clear without scleral icterus. Mouth:  Oral mucosa pink and moist. Poor dentition Neck:  Supple, without mass or thyromegaly. Heart:  S1, S2 present without murmurs, rubs, or gallops. Regular rate and rhythm. Abdomen:  +BS, soft, non-tender and non-distended. No rebound or guarding. No HSM or masses noted. Msk:  Symmetrical without gross deformities. Normal posture. Extremities:  Without edema. Neurologic:  Alert and  oriented x4;  grossly normal  Psych:  Alert and cooperative. Talkative, almost flight-of-ideas

## 2013-05-25 ENCOUNTER — Encounter: Payer: Self-pay | Admitting: Cardiology

## 2013-05-26 ENCOUNTER — Encounter: Payer: Self-pay | Admitting: Gastroenterology

## 2013-05-26 DIAGNOSIS — R1013 Epigastric pain: Secondary | ICD-10-CM | POA: Insufficient documentation

## 2013-05-26 NOTE — Assessment & Plan Note (Signed)
Chronic GERD, last EGD with gastritis in 2012 by Dr. Allena Katz. No dysphagia. Patient with mixed reports regarding symptoms, with phone notes noting improvement with Protonix BID, now stating still having breakthrough GERD. Did not take Dexilant as prescribed at last visit. Pt quite eccentric and difficult to redirect during conversation. Continue Protonix BID for now; see epigastric pain.

## 2013-05-26 NOTE — Progress Notes (Signed)
Cc PCP 

## 2013-05-26 NOTE — Assessment & Plan Note (Signed)
CHRONIC, in the setting of constipation and chronic GERD. Last EGD in 2012 benign, gallbladder absent. Question of chronic abdominal pain; will update blood work to include CBC, HFP, BMP. Discussed possibility of updated EGD, as last was performed at outpatient facility. Patient is requesting CT scan, which we will schedule. She seems to believe a "vein" or superficial "blood clot" is moving around in her abdomen. Further recommendations after CT.

## 2013-05-26 NOTE — Assessment & Plan Note (Signed)
Likely multifactorial and exacerbated by dietary intake (i.e. Fish stick sandwiches). Amitiza prescribed at last visit but patient refused to take due to side effects that she read. Continue supplemental fiber for now. Further recommendations after CT.

## 2013-05-27 ENCOUNTER — Ambulatory Visit: Payer: Medicaid Other | Admitting: Cardiology

## 2013-05-31 ENCOUNTER — Ambulatory Visit (HOSPITAL_COMMUNITY)
Admission: RE | Admit: 2013-05-31 | Discharge: 2013-05-31 | Disposition: A | Discharge status: Home / Self Care | Payer: Medicaid Other | Source: Ambulatory Visit | Attending: Gastroenterology | Admitting: Gastroenterology

## 2013-05-31 DIAGNOSIS — R1013 Epigastric pain: Secondary | ICD-10-CM | POA: Insufficient documentation

## 2013-05-31 DIAGNOSIS — N200 Calculus of kidney: Secondary | ICD-10-CM | POA: Insufficient documentation

## 2013-05-31 MED ORDER — IOHEXOL 300 MG/ML  SOLN
100.0000 mL | Freq: Once | INTRAMUSCULAR | Status: AC | PRN
  Administered 2013-05-31: 100 mL via INTRAVENOUS

## 2013-06-03 ENCOUNTER — Telehealth: Payer: Self-pay | Admitting: Gastroenterology

## 2013-06-03 NOTE — Telephone Encounter (Signed)
Pt called this afternoon asking for her CT results that she had done on 05/31/13. I told her that if results were available that the nurse would be calling her. [phone]

## 2013-06-03 NOTE — Telephone Encounter (Signed)
To AS.   

## 2013-06-03 NOTE — Telephone Encounter (Signed)
Routing to Anna Sams, NP. 

## 2013-06-08 NOTE — Telephone Encounter (Signed)
Please see result note 

## 2013-06-08 NOTE — Progress Notes (Signed)
Quick Note:  She has increased stool in her colon, consistent with constipation.  I had prescribed Amitiza at last visit, but she refused to take this. I suggest restarting this. We need to make sure her abdominal pain is not stemming from chronic constipation. Offer f/u visit to discuss possible EGD.   ______

## 2013-06-09 NOTE — Progress Notes (Signed)
Quick Note:  Pt returned call. I informed her of results and recommendations. She has not taken the Amitiza or the Dexilant. I told her to take the Amitiza ( she has 24 mcg samples). I told her to take one daily with food. She had already taken some fiber therapy this Am and wanted to know if it was still ok to take and I told her yes. She said she gets very constipated when she eats solid food. She did not take the Dexilant because she is afraid to. She said she feels like something else is going on with her besides constipation. I scheduled an OV with her for 06/29/2013 at 10:30 with Gerrit Halls, NP to discuss EGD also. ______

## 2013-06-09 NOTE — Progress Notes (Signed)
Quick Note:  Called. Many rings and no answer. ______ 

## 2013-06-10 ENCOUNTER — Telehealth: Payer: Self-pay

## 2013-06-10 MED ORDER — LUBIPROSTONE 24 MCG PO CAPS: 24.0000 ug | ORAL_CAPSULE | Freq: Two times a day (BID) | ORAL | Status: DC

## 2013-06-10 NOTE — Telephone Encounter (Signed)
Pt returned call and was informed to take the Amitiza bid with food. She would like a prescription sent to [facility] so when she runs out of samples. She was also complaining of her left arm twitching some and her back hurting and I told her that she should follow up with PCP for those issues.

## 2013-06-10 NOTE — Telephone Encounter (Signed)
Pt called and said she took the Amitiza bid yesterday and still only had balls of BM this AM and her stomach is swollen. I told her I would check with Gerrit Halls, NP and see what she recommends. I spoke to Tobi Bastos and she said OK for pt to take bid, and make sure she takes it with food. Pt was going to take a shower and call me back about 9:00 AM.

## 2013-06-10 NOTE — Telephone Encounter (Signed)
Done. Agree with need to see PCP for non-GI issues.

## 2013-06-23 ENCOUNTER — Encounter: Payer: Self-pay | Admitting: Cardiology

## 2013-06-24 ENCOUNTER — Ambulatory Visit (INDEPENDENT_AMBULATORY_CARE_PROVIDER_SITE_OTHER): Payer: Medicaid Other | Admitting: Cardiology

## 2013-06-24 ENCOUNTER — Encounter: Payer: Self-pay | Admitting: Cardiology

## 2013-06-24 VITALS — BP 131/83 | HR 73 | Ht <= 58 in | Wt 117.1 lb

## 2013-06-24 DIAGNOSIS — R0789 Other chest pain: Secondary | ICD-10-CM

## 2013-06-24 DIAGNOSIS — I10 Essential (primary) hypertension: Secondary | ICD-10-CM

## 2013-06-24 DIAGNOSIS — F1910 Other psychoactive substance abuse, uncomplicated: Secondary | ICD-10-CM

## 2013-06-24 NOTE — Assessment & Plan Note (Signed)
Discussed symptoms with the patient today. This has been a recurring problem for years. She has had reassuring noninvasive cardiac evaluations including a normal stress echocardiogram within the last year. ECG shows no acute ST segment changes. Her symptoms are atypical for ischemic heart disease. She describes a pleuritic component, a feeling of burning in her chest, also feeling as if her "lymph nodes" are enlarged in her chest region. No further cardiac testing is planned at this time. I recommended that she discuss with Dr. Sherryll Burger the possibility of CT imaging of her chest, and searching for other noncardiac etiologies.

## 2013-06-24 NOTE — Progress Notes (Signed)
Clinical Summary Victoria Townsend is a 52 y.o.female referred for cardiology consultation by Dr. Sherryll Burger. She was seen previously in 2009 with atypical chest pain symptoms. Adenosine Cardiolite in October 2009 was negative for ischemia. More recently she underwent an exercise echocardiogram that was also negative for ischemia in November 2013. She has no defined history of obstructive CAD.  She reports intermittent, sudden onset, sharp discomfort usually on the left side of her chest, seems to take her breath away, feels worse to breathe in deeply, also describes a burning sensation in her back when she breathes in. Feels like her "lymph nodes" are swollen in her chest. There is no reproducible exertional component to her chest pain. She has had intermittent symptoms for several years.  ECG today shows normal sinus rhythm, no acute ST segment changes.   Allergies  Allergen Reactions  . Ciprofloxacin Swelling    Facial swelling  . Codeine     REACTION: n/v  . Ibuprofen   . Shellfish Allergy Rash    Current Outpatient Prescriptions  Medication Sig Dispense Refill  . acetaminophen (TYLENOL) 500 MG tablet Take 500 mg by mouth every 6 (six) hours as needed.        Marland Kitchen albuterol (VENTOLIN HFA) 108 (90 BASE) MCG/ACT inhaler Inhale 2 puffs into the lungs every 6 (six) hours as needed.        . ALPRAZolam (XANAX) 0.5 MG tablet Take 0.5 mg by mouth 5 (five) times daily.      Marland Kitchen aspirin (ASPIR-LOW) 81 MG EC tablet Take 81 mg by mouth every other day.       . clobetasol (TEMOVATE) 0.05 % cream Apply topically 2 (two) times daily.        Marland Kitchen conjugated estrogens (PREMARIN) vaginal cream Place 1 g vaginally daily.      . Cranberry (AZO-CRANBERRY) 450 MG TABS Take by mouth as needed.        . diphenhydrAMINE (BENADRYL) 25 MG tablet Take 2 tablets 1 hour before CT scan.  2 tablet  0  . nystatin (MYCOSTATIN) ointment Apply topically daily.       . Oxybutynin Chloride (GELNIQUE) 10 % GEL Place onto the skin daily.         . pantoprazole (PROTONIX) 40 MG tablet Take 40 mg by mouth 2 (two) times daily.        . predniSONE (DELTASONE) 50 MG tablet Take 1 tablet 13 hours before scan, 1 tablet 7 hours before, and 1 tablet 1 hour before CT scan.  3 tablet  0  . valACYclovir (VALTREX) 1000 MG tablet Take 1,000 mg by mouth as needed.       . Verapamil HCl CR 300 MG CP24 Take 1 capsule by mouth daily.         No current facility-administered medications for this visit.    Past Medical History  Diagnosis Date  . GERD (gastroesophageal reflux disease)   . Essential hypertension, benign   . Bladder polyps   . Nephrolithiasis   . Chest pain     Cardiolite 2009, no ischemia, EF 53%  . Anxiety disorder     Historically has seen a psychiatrist  . Asthma   . Shoulder pain     Shoulder has been injected  . Polysubstance abuse     History of cocaine, marijuana  . Agoraphobia   . PTSD (post-traumatic stress disorder)     Past Surgical History  Procedure Laterality Date  . Cesarean section with bilateral tubal ligation    .  Appendectomy    . Cholecystectomy    . Bladder polyps    . Urethral stricture dilatation    . Colonoscopy  May 2012    Dr. Allena Katz: scattered diverticula pancolonic  . Esophagogastroduodenoscopy  April 2012    Dr. Allena Katz: gastritis    Family History  Problem Relation Age of Onset  . Coronary artery disease Mother     CABG  . Colon cancer Paternal Uncle     Social History Ms. Cassara reports that she has been smoking Cigarettes.  She has been smoking about 0.50 packs per day. She does not have any smokeless tobacco history on file. Ms. Florance reports that she does not drink alcohol.  Review of Systems Also has intermittent abdominal discomfort, chronic constipation. Still smoking cigarettes. No hemoptysis, fevers or chills. No orthopnea or PND.  Physical Examination Filed Vitals:   06/24/13 1302  BP: 131/83  Pulse: 73   Filed Weights   06/24/13 1302  Weight: 117 lb 1.9 oz  (53.125 kg)   Patient appears comfortable at rest. HEENT: Conjunctiva and lids normal, oropharynx clear with poor dentition. Neck: Supple, no elevated JVP or carotid bruits, no thyromegaly. Lungs: Diminished breath sounds but clear to auscultation, nonlabored breathing at rest. Cardiac: Regular rate and rhythm, no S3 or significant systolic murmur, no pericardial rub. Abdomen: Soft, nontender, bowel sounds present. Extremities: No pitting edema, distal pulses 2+. Skin: Warm and dry. Musculoskeletal: No kyphosis. Neuropsychiatric: Alert and oriented x3, affect grossly appropriate.   Problem List and Plan   Atypical chest pain Discussed symptoms with the patient today. This has been a recurring problem for years. She has had reassuring noninvasive cardiac evaluations including a normal stress echocardiogram within the last year. ECG shows no acute ST segment changes. Her symptoms are atypical for ischemic heart disease. She describes a pleuritic component, a feeling of burning in her chest, also feeling as if her "lymph nodes" are enlarged in her chest region. No further cardiac testing is planned at this time. I recommended that she discuss with Dr. Sherryll Burger the possibility of CT imaging of her chest, and searching for other noncardiac etiologies.  Polysubstance abuse Patient states she is no longer using crack cocaine, occasionally uses marijuana. Still smoking cigarettes. We discussed lifestyle modification and risk reduction today to reduce her risk of adverse cardiac events in the future.  Essential hypertension, benign Followed by Dr. Sherryll Burger.    Jonelle Sidle, M.D., F.A.C.C.

## 2013-06-24 NOTE — Assessment & Plan Note (Addendum)
Patient states she is no longer using crack cocaine, occasionally uses marijuana. Still smoking cigarettes. We discussed lifestyle modification and risk reduction today to reduce her risk of adverse cardiac events in the future.

## 2013-06-24 NOTE — Patient Instructions (Addendum)
   No further cardiac work up needed. Please follow up with your family doctor. Your physician recommends that you continue on your current medications as directed. Please refer to the Current Medication list given to you today.

## 2013-06-24 NOTE — Assessment & Plan Note (Signed)
Followed by Dr. Shah. 

## 2013-06-29 ENCOUNTER — Ambulatory Visit: Payer: Medicaid Other | Admitting: Gastroenterology

## 2013-07-02 ENCOUNTER — Telehealth: Payer: Self-pay | Admitting: Cardiology

## 2013-07-02 NOTE — Telephone Encounter (Signed)
Please call patient in reference to having a CAT scan of her chest done.  Said Dr shah's office told her to have order sent to them????

## 2013-07-02 NOTE — Telephone Encounter (Signed)
Called and informed Tobi Bastos at Dr. Margaretmary Eddy office of [facility]'s recommendations about patient getting CT chest handled through PCP office. Patient informed and verbalized understanding of plan.

## 2013-07-08 ENCOUNTER — Encounter: Payer: Self-pay | Admitting: *Deleted

## 2013-07-08 NOTE — Telephone Encounter (Signed)
This encounter was created in error - please disregard.

## 2013-08-03 ENCOUNTER — Ambulatory Visit (INDEPENDENT_AMBULATORY_CARE_PROVIDER_SITE_OTHER): Payer: Medicaid Other | Admitting: Gastroenterology

## 2013-08-03 ENCOUNTER — Encounter: Payer: Self-pay | Admitting: Gastroenterology

## 2013-08-03 VITALS — BP 123/77 | HR 65 | Temp 97.6°F | Ht <= 58 in | Wt 120.4 lb

## 2013-08-03 DIAGNOSIS — K5900 Constipation, unspecified: Secondary | ICD-10-CM

## 2013-08-03 DIAGNOSIS — R109 Unspecified abdominal pain: Secondary | ICD-10-CM

## 2013-08-03 MED ORDER — LINACLOTIDE 290 MCG PO CAPS: 290.0000 ug | ORAL_CAPSULE | Freq: Every day | ORAL | Status: DC

## 2013-08-03 NOTE — Patient Instructions (Addendum)
Continue fiber therapy. Start taking Linzess 1 capsule each morning, 30 minutes before breakfast. Do not take with food.   We have scheduled you for an upper endoscopy with Dr. Jena Gauss in the near future. Further recommendations to follow.

## 2013-08-03 NOTE — Progress Notes (Signed)
Referring Provider: Kirstie Peri, MD Primary Care Physician:  Kirstie Peri, MD Primary GI: Dr. Jena Gauss   Chief Complaint  Patient presents with  . Follow-up  . Abdominal Pain    HPI:   [name] returns today in follow-up with chronic abdominal pain, constipation, and GERD. She has multiple complaints and actually underwent a CT July 2014 due to her request and concern, although I had recommended an EGD at that time. She now presents to discuss possible EGD due to persistent dyspepsia. Last EGD at outside facility by Dr, Allena Katz in 2012 noting gastritis.   Amitiza didn't help. Suffering X 2 months. Pain relieved after defecation. Feels sore and tender in epigastric region and radiates to RUQ. Feels tender, sore, heavy. Some pain in LUQ. Feels sore around navel area. "bee stinging" continuously like a bumble bee. No GERD. Protonix BID. Worse when she "piles up" (constipated). Worse with walking. Feels like slippery, sliding stinging feeling. No dysphagia.   Past Medical History  Diagnosis Date  . GERD (gastroesophageal reflux disease)   . Essential hypertension, benign   . Bladder polyps   . Nephrolithiasis   . Chest pain     Cardiolite 2009, no ischemia, EF 53%  . Anxiety disorder     Historically has seen a psychiatrist  . Asthma   . Shoulder pain     Shoulder has been injected  . Polysubstance abuse     History of cocaine, marijuana  . Agoraphobia   . PTSD (post-traumatic stress disorder)     Past Surgical History  Procedure Laterality Date  . Cesarean section with bilateral tubal ligation    . Appendectomy    . Cholecystectomy    . Bladder polyps    . Urethral stricture dilatation    . Colonoscopy  May 2012    Dr. Allena Katz: scattered diverticula pancolonic  . Esophagogastroduodenoscopy  April 2012    Dr. Allena Katz: gastritis    Current Outpatient Prescriptions  Medication Sig Dispense Refill  . acetaminophen (TYLENOL) 500 MG tablet Take 500 mg by mouth  every 6 (six) hours as needed.        Marland Kitchen albuterol (VENTOLIN HFA) 108 (90 BASE) MCG/ACT inhaler Inhale 2 puffs into the lungs every 6 (six) hours as needed.        . ALPRAZolam (XANAX) 0.5 MG tablet Take 0.5 mg by mouth 5 (five) times daily.      Marland Kitchen aspirin (ASPIR-LOW) 81 MG EC tablet Take 81 mg by mouth every other day.       . clobetasol (TEMOVATE) 0.05 % cream Apply topically 2 (two) times daily.        Marland Kitchen conjugated estrogens (PREMARIN) vaginal cream Place 1 g vaginally daily.      . Cranberry (AZO-CRANBERRY) 450 MG TABS Take by mouth as needed.        . diphenhydrAMINE (BENADRYL) 25 MG tablet Take 2 tablets 1 hour before CT scan.  2 tablet  0  . nystatin (MYCOSTATIN) ointment Apply topically daily.       . Oxybutynin Chloride (GELNIQUE) 10 % GEL Place onto the skin daily.        . pantoprazole (PROTONIX) 40 MG tablet Take 40 mg by mouth 2 (two) times daily.        . predniSONE (DELTASONE) 50 MG tablet Take 1 tablet 13 hours before scan, 1 tablet 7 hours before, and 1 tablet 1 hour before CT scan.  3 tablet  0  . psyllium (METAMUCIL) 58.6 %  powder Take 1 packet by mouth 3 (three) times daily.      . valACYclovir (VALTREX) 1000 MG tablet Take 1,000 mg by mouth as needed.       . Verapamil HCl CR 300 MG CP24 Take 1 capsule by mouth daily.        . Linaclotide (LINZESS) 290 MCG CAPS capsule Take 1 capsule (290 mcg total) by mouth daily. 30 minutes before breakfast. Do not take with food.  30 capsule  11   No current facility-administered medications for this visit.    Allergies as of 08/03/2013 - Review Complete 08/03/2013  Allergen Reaction Noted  . Ciprofloxacin Swelling 04/12/2013  . Codeine    . Ibuprofen    . Shellfish allergy Rash 04/12/2013    Family History  Problem Relation Age of Onset  . Coronary artery disease Mother     CABG  . Colon cancer Paternal Uncle     History   Social History  . Marital Status: Divorced    Spouse Name: N/A    Number of Children: N/A  .  Years of Education: N/A   Social History Main Topics  . Smoking status: Current Some Day Smoker -- 0.50 packs/day    Types: Cigarettes  . Smokeless tobacco: None  . Alcohol Use: No     Comment: Former user  . Drug Use: Yes    Special: Marijuana, Cocaine     Comment: Last cocaine about 4 months ago; actively smoking marijuana  . Sexual Activity: None   Other Topics Concern  . None   Social History Narrative  . None    Review of Systems: Negative unless mentioned in HPI.   Physical Exam: BP 123/77  Pulse 65  Temp(Src) 97.6 F (36.4 C) (Oral)  Ht 4\' 10"  (1.473 m)  Wt 120 lb 6.4 oz (54.613 kg)  BMI 25.17 kg/m2 General:   Alert and oriented. No distress noted but anxious Head:  Normocephalic and atraumatic. Eyes:  Conjuctiva clear without scleral icterus. Mouth:  Poor dentition  Heart:  S1, S2 present without murmurs, rubs, or gallops. Regular rate and rhythm. Abdomen:  +BS, soft, TTP upper abdomen and RUQ,  and non-distended. No rebound or guarding. No HSM or masses noted. Msk:  Symmetrical without gross deformities. Normal posture. Extremities:  Without edema. Neurologic:  Alert and  oriented x4;  grossly normal neurologically. Skin:  Intact without significant lesions or rashes. Psych:  Alert and cooperative. Anxious

## 2013-08-04 ENCOUNTER — Telehealth: Payer: Self-pay | Admitting: Gastroenterology

## 2013-08-04 ENCOUNTER — Ambulatory Visit: Payer: Medicaid Other | Admitting: Cardiology

## 2013-08-04 ENCOUNTER — Other Ambulatory Visit: Payer: Self-pay | Admitting: Internal Medicine

## 2013-08-04 DIAGNOSIS — K5900 Constipation, unspecified: Secondary | ICD-10-CM | POA: Insufficient documentation

## 2013-08-04 DIAGNOSIS — R109 Unspecified abdominal pain: Secondary | ICD-10-CM | POA: Insufficient documentation

## 2013-08-04 NOTE — Progress Notes (Signed)
CC'd to PCP 

## 2013-08-04 NOTE — Telephone Encounter (Signed)
Let's get HFP and lipase on file.  Seen yesterday.  I have ordered labs.

## 2013-08-04 NOTE — Assessment & Plan Note (Signed)
Failed Amitiza. Start Linzess 290 mcg daily. TCS on file from 2012.

## 2013-08-04 NOTE — Assessment & Plan Note (Signed)
Chronic upper abdominal pain, constant, question a component of adhesive disease, neuropathic abdominal pain, unable to exclude possible underlying gastritis. Likely constipation significantly affecting abdominal discomfort. CT on file and negative.   Update HFP and lipase for completeness' sake Proceed with upper endoscopy in the near future with Dr. Jena Gauss. The risks, benefits, and alternatives have been discussed in detail with patient. They have stated understanding and desire to proceed.  Phenergan 25 mg IV on call due to polypharmacy Increase Linzess to 290 mcg daily Anticipate pain management referral

## 2013-08-06 NOTE — Telephone Encounter (Signed)
Letter and orders mailed to pt.

## 2013-08-09 ENCOUNTER — Encounter (HOSPITAL_COMMUNITY): Payer: Self-pay

## 2013-08-09 ENCOUNTER — Encounter (HOSPITAL_COMMUNITY)
Admission: RE | Disposition: A | Discharge status: Home / Self Care | Payer: Self-pay | Source: Ambulatory Visit | Attending: Internal Medicine

## 2013-08-09 ENCOUNTER — Ambulatory Visit (HOSPITAL_COMMUNITY)
Admission: RE | Admit: 2013-08-09 | Discharge: 2013-08-09 | Disposition: A | Discharge status: Home / Self Care | Payer: Medicaid Other | Source: Ambulatory Visit | Attending: Internal Medicine | Admitting: Internal Medicine

## 2013-08-09 DIAGNOSIS — K449 Diaphragmatic hernia without obstruction or gangrene: Secondary | ICD-10-CM

## 2013-08-09 DIAGNOSIS — R109 Unspecified abdominal pain: Secondary | ICD-10-CM

## 2013-08-09 DIAGNOSIS — D131 Benign neoplasm of stomach: Secondary | ICD-10-CM

## 2013-08-09 DIAGNOSIS — K3189 Other diseases of stomach and duodenum: Secondary | ICD-10-CM | POA: Insufficient documentation

## 2013-08-09 DIAGNOSIS — K2940 Chronic atrophic gastritis without bleeding: Secondary | ICD-10-CM | POA: Insufficient documentation

## 2013-08-09 DIAGNOSIS — I10 Essential (primary) hypertension: Secondary | ICD-10-CM | POA: Insufficient documentation

## 2013-08-09 DIAGNOSIS — R1013 Epigastric pain: Secondary | ICD-10-CM

## 2013-08-09 DIAGNOSIS — K5900 Constipation, unspecified: Secondary | ICD-10-CM

## 2013-08-09 HISTORY — PX: ESOPHAGOGASTRODUODENOSCOPY: SHX5428

## 2013-08-09 LAB — HEPATIC FUNCTION PANEL
ALT: 7 U/L (ref 0–35)
AST: 16 U/L (ref 0–37)
Total Protein: 6.1 g/dL (ref 6.0–8.3)

## 2013-08-09 SURGERY — EGD (ESOPHAGOGASTRODUODENOSCOPY)
Anesthesia: Moderate Sedation

## 2013-08-09 MED ORDER — ONDANSETRON HCL 4 MG/2ML IJ SOLN
INTRAMUSCULAR | Status: DC | PRN
  Administered 2013-08-09: 4 mg via INTRAVENOUS

## 2013-08-09 MED ORDER — SIMETHICONE 40 MG/0.6ML PO SUSP
ORAL | Status: DC | PRN
  Administered 2013-08-09: 14:00:00

## 2013-08-09 MED ORDER — MIDAZOLAM HCL 5 MG/5ML IJ SOLN
INTRAMUSCULAR | Status: DC | PRN
  Administered 2013-08-09 (×2): 2 mg via INTRAVENOUS

## 2013-08-09 MED ORDER — MEPERIDINE HCL 100 MG/ML IJ SOLN
INTRAMUSCULAR | Status: AC
  Filled 2013-08-09: qty 2

## 2013-08-09 MED ORDER — ONDANSETRON HCL 4 MG/2ML IJ SOLN
INTRAMUSCULAR | Status: AC
  Filled 2013-08-09: qty 2

## 2013-08-09 MED ORDER — BUTAMBEN-TETRACAINE-BENZOCAINE 2-2-14 % EX AERO
INHALATION_SPRAY | CUTANEOUS | Status: DC | PRN
  Administered 2013-08-09: 2 via TOPICAL

## 2013-08-09 MED ORDER — PROMETHAZINE HCL 25 MG/ML IJ SOLN
INTRAMUSCULAR | Status: AC
  Filled 2013-08-09: qty 1

## 2013-08-09 MED ORDER — MEPERIDINE HCL 100 MG/ML IJ SOLN
INTRAMUSCULAR | Status: DC | PRN
  Administered 2013-08-09: 25 mg via INTRAVENOUS
  Administered 2013-08-09: 50 mg via INTRAVENOUS

## 2013-08-09 MED ORDER — MIDAZOLAM HCL 5 MG/5ML IJ SOLN
INTRAMUSCULAR | Status: AC
  Filled 2013-08-09: qty 10

## 2013-08-09 MED ORDER — SODIUM CHLORIDE 0.9 % IV SOLN
INTRAVENOUS | Status: DC
  Administered 2013-08-09: 13:00:00 via INTRAVENOUS

## 2013-08-09 MED ORDER — SODIUM CHLORIDE 0.9 % IJ SOLN
INTRAMUSCULAR | Status: AC
  Filled 2013-08-09: qty 10

## 2013-08-09 MED ORDER — PROMETHAZINE HCL 25 MG/ML IJ SOLN
25.0000 mg | Freq: Once | INTRAMUSCULAR | Status: AC
  Administered 2013-08-09: 25 mg via INTRAVENOUS

## 2013-08-09 NOTE — Interval H&P Note (Signed)
History and Physical Interval Note:  08/09/2013 1:39 PM  Victoria Townsend  has presented today for surgery, with the diagnosis of Abdominal Pain and constipation  The various methods of treatment have been discussed with the patient and family. After consideration of risks, benefits and other options for treatment, the patient has consented to  Procedure(s) with comments: ESOPHAGOGASTRODUODENOSCOPY (EGD) (N/A) - 2:00 as a surgical intervention .  The patient's history has been reviewed, patient examined, no change in status, stable for surgery.  I have reviewed the patient's chart and labs.  Questions were answered to the patient's satisfaction.   No change. EGD today per plan.The risks, benefits, limitations, alternatives and imponderables have been reviewed with the patient. Questions have been answered. All parties are agreeable.   Eula Listen

## 2013-08-09 NOTE — Op Note (Signed)
[facility] [address] [place], [address]   ENDOSCOPY PROCEDURE REPORT  PATIENT: Victoria Townsend, Victoria Townsend  MR#: [id number] BIRTHDATE: [birth year] , 52  yrs. old GENDER: Female ENDOSCOPIST: R.  Roetta Sessions, MD FACP FACG REFERRED BY:  Kirstie Peri, M.D. PROCEDURE DATE:  08/09/2013 PROCEDURE:     EGD with gastric biopsy  INDICATIONS:    dyspepsia  INFORMED CONSENT:   The risks, benefits, limitations, alternatives and imponderables have been discussed.  The potential for biopsy, esophogeal dilation, etc. have also been reviewed.  Questions have been answered.  All parties agreeable.  Please see the history and physical in the medical record for more information.  MEDICATIONS:  Versed 4 mgIV and  Demerol 75 mg IV in divided doses. Zofran 4 mg IV. Phenergan 25 mg IV. Cetacaine spray.  DESCRIPTION OF PROCEDURE:   The EG-[id number] ([id number])  endoscope was introduced through the mouth and advanced to the second portion of the duodenum without difficulty or limitations.  The mucosal surfaces were surveyed very carefully during advancement of the scope and upon withdrawal.  Retroflexion view of the proximal stomach and esophagogastric junction was performed.      FINDINGS:  normal esophagus. Stomach empty. Small hiatal hernia. Few scattered 1-2 mm benign-appearing polyps. Some "mosaic" changes of the mid gastric mucosa. No ulcer or infiltrating process. Patent pylorus. Normal first and second portion duodenum.  THERAPEUTIC / DIAGNOSTIC MANEUVERS PERFORMED:  biopsies of one of the gastric polyps and the gastric mucosa were taken for histologic study   COMPLICATIONS:  None  IMPRESSION:   Gastric polyps/abnormal gastric because of uncertain significance-status post biopsy. Small hiatal hernia.  RECOMMENDATIONS:   Stop using illicit drugs. Start Linzess for constipation as previously prescribed. Followup on pathology.    _______________________________ R. Roetta Sessions, MD FACP [facility] eSigned:  R. Roetta Sessions, MD FACP [facility] 08/09/2013 1:59 PM     CC:

## 2013-08-09 NOTE — H&P (View-Only) (Signed)
 Referring Provider: Shah, Ashish, MD Primary Care Physician:  SHAH,ASHISH, MD Primary GI: Dr. Rourk   Chief Complaint  Patient presents with  . Follow-up  . Abdominal Pain    HPI:   52-year-old eccentric female returns today in follow-up with chronic abdominal pain, constipation, and GERD. She has multiple complaints and actually underwent a CT July 2014 due to her request and concern, although I had recommended an EGD at that time. She now presents to discuss possible EGD due to persistent dyspepsia. Last EGD at outside facility by Dr, Patel in 2012 noting gastritis.   Amitiza didn't help. Suffering X 2 months. Pain relieved after defecation. Feels sore and tender in epigastric region and radiates to RUQ. Feels tender, sore, heavy. Some pain in LUQ. Feels sore around navel area. "bee stinging" continuously like a bumble bee. No GERD. Protonix BID. Worse when she "piles up" (constipated). Worse with walking. Feels like slippery, sliding stinging feeling. No dysphagia.   Past Medical History  Diagnosis Date  . GERD (gastroesophageal reflux disease)   . Essential hypertension, benign   . Bladder polyps   . Nephrolithiasis   . Chest pain     Cardiolite 2009, no ischemia, EF 53%  . Anxiety disorder     Historically has seen a psychiatrist  . Asthma   . Shoulder pain     Shoulder has been injected  . Polysubstance abuse     History of cocaine, marijuana  . Agoraphobia   . PTSD (post-traumatic stress disorder)     Past Surgical History  Procedure Laterality Date  . Cesarean section with bilateral tubal ligation    . Appendectomy    . Cholecystectomy    . Bladder polyps    . Urethral stricture dilatation    . Colonoscopy  May 2012    Dr. Patel: scattered diverticula pancolonic  . Esophagogastroduodenoscopy  April 2012    Dr. Patel: gastritis    Current Outpatient Prescriptions  Medication Sig Dispense Refill  . acetaminophen (TYLENOL) 500 MG tablet Take 500 mg by mouth  every 6 (six) hours as needed.        . albuterol (VENTOLIN HFA) 108 (90 BASE) MCG/ACT inhaler Inhale 2 puffs into the lungs every 6 (six) hours as needed.        . ALPRAZolam (XANAX) 0.5 MG tablet Take 0.5 mg by mouth 5 (five) times daily.      . aspirin (ASPIR-LOW) 81 MG EC tablet Take 81 mg by mouth every other day.       . clobetasol (TEMOVATE) 0.05 % cream Apply topically 2 (two) times daily.        . conjugated estrogens (PREMARIN) vaginal cream Place 1 g vaginally daily.      . Cranberry (AZO-CRANBERRY) 450 MG TABS Take by mouth as needed.        . diphenhydrAMINE (BENADRYL) 25 MG tablet Take 2 tablets 1 hour before CT scan.  2 tablet  0  . nystatin (MYCOSTATIN) ointment Apply topically daily.       . Oxybutynin Chloride (GELNIQUE) 10 % GEL Place onto the skin daily.        . pantoprazole (PROTONIX) 40 MG tablet Take 40 mg by mouth 2 (two) times daily.        . predniSONE (DELTASONE) 50 MG tablet Take 1 tablet 13 hours before scan, 1 tablet 7 hours before, and 1 tablet 1 hour before CT scan.  3 tablet  0  . psyllium (METAMUCIL) 58.6 %   powder Take 1 packet by mouth 3 (three) times daily.      . valACYclovir (VALTREX) 1000 MG tablet Take 1,000 mg by mouth as needed.       . Verapamil HCl CR 300 MG CP24 Take 1 capsule by mouth daily.        . Linaclotide (LINZESS) 290 MCG CAPS capsule Take 1 capsule (290 mcg total) by mouth daily. 30 minutes before breakfast. Do not take with food.  30 capsule  11   No current facility-administered medications for this visit.    Allergies as of 08/03/2013 - Review Complete 08/03/2013  Allergen Reaction Noted  . Ciprofloxacin Swelling 04/12/2013  . Codeine    . Ibuprofen    . Shellfish allergy Rash 04/12/2013    Family History  Problem Relation Age of Onset  . Coronary artery disease Mother     CABG  . Colon cancer Paternal Uncle     History   Social History  . Marital Status: Divorced    Spouse Name: N/A    Number of Children: N/A  .  Years of Education: N/A   Social History Main Topics  . Smoking status: Current Some Day Smoker -- 0.50 packs/day    Types: Cigarettes  . Smokeless tobacco: None  . Alcohol Use: No     Comment: Former user  . Drug Use: Yes    Special: Marijuana, Cocaine     Comment: Last cocaine about 4 months ago; actively smoking marijuana  . Sexual Activity: None   Other Topics Concern  . None   Social History Narrative  . None    Review of Systems: Negative unless mentioned in HPI.   Physical Exam: BP 123/77  Pulse 65  Temp(Src) 97.6 F (36.4 C) (Oral)  Ht 4' 10" (1.473 m)  Wt 120 lb 6.4 oz (54.613 kg)  BMI 25.17 kg/m2 General:   Alert and oriented. No distress noted but anxious Head:  Normocephalic and atraumatic. Eyes:  Conjuctiva clear without scleral icterus. Mouth:  Poor dentition  Heart:  S1, S2 present without murmurs, rubs, or gallops. Regular rate and rhythm. Abdomen:  +BS, soft, TTP upper abdomen and RUQ,  and non-distended. No rebound or guarding. No HSM or masses noted. Msk:  Symmetrical without gross deformities. Normal posture. Extremities:  Without edema. Neurologic:  Alert and  oriented x4;  grossly normal neurologically. Skin:  Intact without significant lesions or rashes. Psych:  Alert and cooperative. Anxious  

## 2013-08-10 ENCOUNTER — Telehealth: Payer: Self-pay | Admitting: Internal Medicine

## 2013-08-10 NOTE — Telephone Encounter (Signed)
Pt had procedure done yesterday and is calling for her results. She is moving and phone will be disconnected tomorrow. I updated her her new address and she can be reached on [phone] or her daughter's number is [phone]

## 2013-08-10 NOTE — Telephone Encounter (Signed)
Called and spoke with pt- informed her that her path was not back yet from yesterday and she will be getting a letter in the mail from [employer]. Made sure address in computer was correct.

## 2013-08-12 ENCOUNTER — Encounter (HOSPITAL_COMMUNITY): Payer: Self-pay | Admitting: Internal Medicine

## 2013-08-17 ENCOUNTER — Emergency Department (HOSPITAL_COMMUNITY)
Admission: EM | Admit: 2013-08-17 | Discharge: 2013-08-17 | Disposition: A | Discharge status: Home / Self Care | Payer: Medicaid Other | Attending: Emergency Medicine | Admitting: Emergency Medicine

## 2013-08-17 ENCOUNTER — Encounter (HOSPITAL_COMMUNITY): Payer: Self-pay | Admitting: *Deleted

## 2013-08-17 ENCOUNTER — Emergency Department (HOSPITAL_COMMUNITY): Payer: Medicaid Other

## 2013-08-17 ENCOUNTER — Encounter (HOSPITAL_COMMUNITY): Payer: Self-pay | Admitting: Internal Medicine

## 2013-08-17 DIAGNOSIS — R1013 Epigastric pain: Secondary | ICD-10-CM | POA: Insufficient documentation

## 2013-08-17 DIAGNOSIS — R109 Unspecified abdominal pain: Secondary | ICD-10-CM

## 2013-08-17 DIAGNOSIS — F17200 Nicotine dependence, unspecified, uncomplicated: Secondary | ICD-10-CM | POA: Insufficient documentation

## 2013-08-17 DIAGNOSIS — I10 Essential (primary) hypertension: Secondary | ICD-10-CM | POA: Insufficient documentation

## 2013-08-17 DIAGNOSIS — E876 Hypokalemia: Secondary | ICD-10-CM

## 2013-08-17 DIAGNOSIS — Z79899 Other long term (current) drug therapy: Secondary | ICD-10-CM | POA: Insufficient documentation

## 2013-08-17 DIAGNOSIS — R112 Nausea with vomiting, unspecified: Secondary | ICD-10-CM

## 2013-08-17 HISTORY — DX: Cardiac arrhythmia, unspecified: I49.9

## 2013-08-17 LAB — CBC WITH DIFFERENTIAL/PLATELET
Basophils Absolute: 0 10*3/uL (ref 0.0–0.1)
HCT: 41.4 % (ref 36.0–46.0)
Hemoglobin: 14.4 g/dL (ref 12.0–15.0)
Lymphocytes Relative: 26 % (ref 12–46)
Lymphs Abs: 2.2 10*3/uL (ref 0.7–4.0)
MCV: 91.8 fL (ref 78.0–100.0)
Monocytes Absolute: 0.5 10*3/uL (ref 0.1–1.0)
Monocytes Relative: 6 % (ref 3–12)
Neutro Abs: 5.8 10*3/uL (ref 1.7–7.7)
Platelets: 307 10*3/uL (ref 150–400)
WBC: 8.6 10*3/uL (ref 4.0–10.5)

## 2013-08-17 LAB — COMPREHENSIVE METABOLIC PANEL
ALT: 9 U/L (ref 0–35)
AST: 18 U/L (ref 0–37)
BUN: 7 mg/dL (ref 6–23)
CO2: 21 mEq/L (ref 19–32)
Chloride: 99 mEq/L (ref 96–112)
Creatinine, Ser: 0.55 mg/dL (ref 0.50–1.10)
GFR calc non Af Amer: >90 mL/min (ref >90)
Glucose, Bld: 138 mg/dL — ABNORMAL HIGH (ref 70–99)
Total Bilirubin: 1.3 mg/dL — ABNORMAL HIGH (ref 0.3–1.2)

## 2013-08-17 LAB — URINALYSIS, ROUTINE W REFLEX MICROSCOPIC
Bilirubin Urine: NEGATIVE
Glucose, UA: NEGATIVE mg/dL
Leukocytes, UA: NEGATIVE
Protein, ur: NEGATIVE mg/dL
Urobilinogen, UA: 0.2 mg/dL (ref 0.0–1.0)
pH: 7.5 (ref 5.0–8.0)

## 2013-08-17 LAB — LACTIC ACID, PLASMA: Lactic Acid, Venous: 1.3 mmol/L (ref 0.5–2.2)

## 2013-08-17 LAB — URINE MICROSCOPIC-ADD ON

## 2013-08-17 MED ORDER — DIPHENHYDRAMINE HCL 50 MG/ML IJ SOLN
25.0000 mg | Freq: Once | INTRAMUSCULAR | Status: AC
  Administered 2013-08-17: 25 mg via INTRAVENOUS
  Filled 2013-08-17: qty 1

## 2013-08-17 MED ORDER — MORPHINE SULFATE 2 MG/ML IJ SOLN
2.0000 mg | Freq: Once | INTRAMUSCULAR | Status: AC
  Administered 2013-08-17: 2 mg via INTRAVENOUS
  Filled 2013-08-17: qty 1

## 2013-08-17 MED ORDER — TRAMADOL HCL 50 MG PO TABS: 50.0000 mg | ORAL_TABLET | Freq: Four times a day (QID) | ORAL | Status: DC | PRN

## 2013-08-17 MED ORDER — METOCLOPRAMIDE HCL 5 MG/ML IJ SOLN
10.0000 mg | Freq: Once | INTRAMUSCULAR | Status: AC
  Administered 2013-08-17: 10 mg via INTRAVENOUS
  Filled 2013-08-17: qty 2

## 2013-08-17 MED ORDER — POTASSIUM CHLORIDE 10 MEQ/100ML IV SOLN
10.0000 meq | Freq: Once | INTRAVENOUS | Status: AC
  Administered 2013-08-17: 10 meq via INTRAVENOUS
  Filled 2013-08-17: qty 100

## 2013-08-17 MED ORDER — METOCLOPRAMIDE HCL 10 MG PO TABS: 10.0000 mg | ORAL_TABLET | Freq: Four times a day (QID) | ORAL | Status: DC | PRN

## 2013-08-17 MED ORDER — SODIUM CHLORIDE 0.9 % IV SOLN
1000.0000 mL | INTRAVENOUS | Status: DC
  Administered 2013-08-17: 1000 mL via INTRAVENOUS

## 2013-08-17 MED ORDER — POTASSIUM CHLORIDE CRYS ER 20 MEQ PO TBCR: 20.0000 meq | EXTENDED_RELEASE_TABLET | Freq: Every day | ORAL | Status: DC

## 2013-08-17 MED ORDER — SODIUM CHLORIDE 0.9 % IV SOLN
1000.0000 mL | Freq: Once | INTRAVENOUS | Status: AC
  Administered 2013-08-17: 1000 mL via INTRAVENOUS

## 2013-08-17 MED ORDER — ONDANSETRON HCL 4 MG/2ML IJ SOLN
4.0000 mg | Freq: Once | INTRAMUSCULAR | Status: AC
  Administered 2013-08-17: 4 mg via INTRAVENOUS
  Filled 2013-08-17: qty 2

## 2013-08-17 NOTE — ED Provider Notes (Signed)
CSN: [id number]     Arrival date & time 08/17/13  0246 History   First MD Initiated Contact with Patient 08/17/13 [phone]     Chief Complaint  Patient presents with  . Abdominal Pain   (Consider location/radiation/quality/duration/timing/severity/associated sxs/prior Treatment) Patient is a 52 y.o. female presenting with abdominal pain. The history is provided by the patient.  Abdominal Pain She came in by ambulance complaining of abdominal pain for the last 3 hours. She is quite histrionic and is complaining of hyperventilating in her arms and that her kidneys hurt and she has severe abdominal pain and wants help for. When I went in to evaluate her, she was on the phone talking to her sister and without gadolinium the phone. I told her that I would, and examine her as soon as she was finished talking on the phone and she talked for at least another 5 minutes. Then, when I did go in to examine her, she said that she had not put her eyebrows on and that everybody was laughing at her. She then picked up the phone and called her sister back. She talked with her sister for another 5-10 minutes some before you go back in finish the exam. She was complaining to her sister that she had had an endoscopy and they were supposed to look around but they actually took 2 biopsies in that she was going to see the doctor because that was against the law. She does not he responded well to questioning and tends to focus on her kidneys, her lungs, her arms, and her abdomen. Apparently she has had some nausea but no vomiting. She has been treated for chronic constipation but states that she has not been constipated for the last week and she thinks she had a bowel movement 2 or 3 days ago. She states it has been nausea but no vomiting. She denies fever or chills. Then she started complaining of chest pain. Then she asked me about is going to check her potassium because she was told it was low and she was at [facility].  When asked when she was at [facility], she states it was for 5 days ago but she doesn't know why she was there.  Past Medical History  Diagnosis Date  . Irregular heartbeat   . Hypertension    Past Surgical History  Procedure Laterality Date  . Cholecystectomy    . Appendectomy     History reviewed. No pertinent family history. History  Substance Use Topics  . Smoking status: Current Every Day Smoker  . Smokeless tobacco: Not on file  . Alcohol Use: No   OB History   Grav Para Term Preterm Abortions TAB SAB Ect Mult Living                 Review of Systems  Gastrointestinal: Positive for abdominal pain.  All other systems reviewed and are negative.    Allergies  Ibuprofen and Shellfish allergy  Home Medications   Current Outpatient Rx  Name  Route  Sig  Dispense  Refill  . verapamil (VERELAN PM) 360 MG 24 hr capsule   Oral   Take 360 mg by mouth at bedtime.          BP 149/76  Pulse 20  Temp(Src) 99.5 F (37.5 C)  Resp 20  SpO2 100% Physical Exam  Nursing note and vitals reviewed.  52 year old female, resting comfortably and in no acute distress. Vital signs are significant for hypertension  with blood pressure 149/76. Oxygen saturation is 100%, which is normal. Head is normocephalic and atraumatic. PERRLA, EOMI. Oropharynx is clear. There is no scleral icterus. Neck is nontender and supple without adenopathy or JVD. Back is nontender and there is no CVA tenderness. Lungs are clear without rales, wheezes, or rhonchi. Chest is nontender. Heart has regular rate and rhythm without murmur. Abdomen is soft, flat, with very mild tenderness in the epigastric area. No other areas of tenderness are elicited. There is no rebound or guarding. There are no masses or hepatosplenomegaly and peristalsis is normoactive. Extremities have no cyanosis or edema, full range of motion is present. Skin is warm and dry without rash. Neurologic: She is awake, alert,  oriented but with bizarre affect as noted above, cranial nerves are intact, there are no motor or sensory deficits.   ED Course  Procedures (including critical care time) Labs Review Results for orders placed during the hospital encounter of 08/17/13  URINALYSIS, ROUTINE W REFLEX MICROSCOPIC      Result Value Range   Color, Urine STRAW (*) YELLOW   APPearance CLEAR  CLEAR   Specific Gravity, Urine 1.010  1.005 - 1.030   pH 7.5  5.0 - 8.0   Glucose, UA NEGATIVE  NEGATIVE mg/dL   Hgb urine dipstick TRACE (*) NEGATIVE   Bilirubin Urine NEGATIVE  NEGATIVE   Ketones, ur 15 (*) NEGATIVE mg/dL   Protein, ur NEGATIVE  NEGATIVE mg/dL   Urobilinogen, UA 0.2  0.0 - 1.0 mg/dL   Nitrite NEGATIVE  NEGATIVE   Leukocytes, UA NEGATIVE  NEGATIVE  CBC WITH DIFFERENTIAL      Result Value Range   WBC 8.6  4.0 - 10.5 K/uL   RBC 4.51  3.87 - 5.11 MIL/uL   Hemoglobin 14.4  12.0 - 15.0 g/dL   HCT 16.1  09.6 - 04.5 %   MCV 91.8  78.0 - 100.0 fL   MCH 31.9  26.0 - 34.0 pg   MCHC 34.8  30.0 - 36.0 g/dL   RDW 40.9  81.1 - 91.4 %   Platelets 307  150 - 400 K/uL   Neutrophils Relative % 67  43 - 77 %   Neutro Abs 5.8  1.7 - 7.7 K/uL   Lymphocytes Relative 26  12 - 46 %   Lymphs Abs 2.2  0.7 - 4.0 K/uL   Monocytes Relative 6  3 - 12 %   Monocytes Absolute 0.5  0.1 - 1.0 K/uL   Eosinophils Relative 1  0 - 5 %   Eosinophils Absolute 0.0  0.0 - 0.7 K/uL   Basophils Relative 0  0 - 1 %   Basophils Absolute 0.0  0.0 - 0.1 K/uL  COMPREHENSIVE METABOLIC PANEL      Result Value Range   Sodium 138  135 - 145 mEq/L   Potassium 3.3 (*) 3.5 - 5.1 mEq/L   Chloride 99  96 - 112 mEq/L   CO2 21  19 - 32 mEq/L   Glucose, Bld 138 (*) 70 - 99 mg/dL   BUN 7  6 - 23 mg/dL   Creatinine, Ser 7.82  0.50 - 1.10 mg/dL   Calcium 95.6  8.4 - 21.3 mg/dL   Total Protein 8.0  6.0 - 8.3 g/dL   Albumin 4.9  3.5 - 5.2 g/dL   AST 18  0 - 37 U/L   ALT 9  0 - 35 U/L   Alkaline Phosphatase 53  39 - 117 U/L  Total Bilirubin 1.3  (*) 0.3 - 1.2 mg/dL   GFR calc non Af Amer >90  >90 mL/min   GFR calc Af Amer >90  >90 mL/min  LIPASE, BLOOD      Result Value Range   Lipase 44  11 - 59 U/L  LACTIC ACID, PLASMA      Result Value Range   Lactic Acid, Venous 1.3  0.5 - 2.2 mmol/L  URINE MICROSCOPIC-ADD ON      Result Value Range   Squamous Epithelial / LPF RARE  RARE   WBC, UA 0-2  <3 WBC/hpf   RBC / HPF 0-2  <3 RBC/hpf   Bacteria, UA RARE  RARE  TROPONIN I      Result Value Range   Troponin I <0.30  <0.30 ng/mL   Imaging Review Dg Chest Port 1 View  08/17/2013   CLINICAL DATA:  Chest pain.  EXAM: PORTABLE CHEST - 1 VIEW  COMPARISON:  None.  FINDINGS: The heart size and mediastinal contours are within normal limits. Both lungs are clear. The visualized skeletal structures are unremarkable.  IMPRESSION: No active disease.   Electronically Signed   By: Drusilla Kanner M.D.   On: 08/17/2013 04:40   Dg Abd Portable 1v  08/17/2013   CLINICAL DATA:  Abdominal pain.  EXAM: PORTABLE ABDOMEN - 1 VIEW  COMPARISON:  None.  FINDINGS: The bowel gas pattern is normal. No radio-opaque calculi or other significant radiographic abnormality are seen. Cholecystectomy clips are noted. Small rounded calcification in the left upper quadrant with a central lucency is likely vascular.  IMPRESSION: No acute finding.   Electronically Signed   By: Drusilla Kanner M.D.   On: 08/17/2013 04:41   Images viewed by me.   Date: 08/17/2013  Rate: 78  Rhythm: normal sinus rhythm  QRS Axis: normal  Intervals: normal  ST/T Wave abnormalities: normal  Conduction Disutrbances:Incomplete right bundle-branch block  Narrative Interpretation: Incomplete right bundle-branch block. When compared with ECG of 06/24/2013, no significant changes are seen.  Old EKG Reviewed: unchanged   MDM   1. Abdominal pain   2. Nausea & vomiting   3. Hypokalemia    Multiple complaints in a rather bizarre fashion. Old records are reviewed and she has been treated for  chronic abdominal pain for the last 3 months. She had a negative CT scan of her abdomen and an essentially negative upper endoscopy. 2 polyps were biopsied which were found to be hyperplastic polyps without evidence of H. pylori. She has failed a trial of Linzess. I doubt there is serious pathology present but screening labs and x-rays and ECG have been ordered.  She was given a small dose of morphine and a dose of ondansetron and following this, she was resting and not as agitated but states that her nausea had not improved and her pain had not improved. She was given a second dose of morphine a dose of metoclopramide. When she was calm down, and a long conversation was held with her regarding endoscopy and routine biopsies. She was also concerned because she was told that she would only be moderately sedated and she thinks that she was completely out. I have told her about retrograde amnesia from anesthetic agents but she did not believe that. Talk with her about the possibility of being referred to a [facility] to try and better define her issues. This would be especially important since he seems to have some issues with a gastroenterologist she has been seeing  here. Patient seemed to accept my suggestions. She was found to be hypokalemic and is given both oral and IV potassium. She is discharged with prescription for K-Dur,  as well as metoclopramide and tramadol.  Dione Booze, MD 08/17/13 [phone]

## 2013-08-17 NOTE — ED Notes (Addendum)
Pt to department via EMS.  Reporting abdominal pain that started today getting worse in past hour.  Reporting chest pain "off and on" today.  Reports nausea, no vomiting.  Pt reports she does have abdominal pain frequently, "but not as bad as it is now".

## 2013-08-17 NOTE — ED Notes (Signed)
Pt demanding more medication stating that she still feels nauseated.  Explained to patient that she received a dose 3 minutes ago, and she needs to allow the medication time to work.

## 2013-08-17 NOTE — ED Notes (Addendum)
Pt on the phone stating "they are making fun of me."  I know they are watching me on a camera.  Pt stating she wishes to see another doctor because she doesn't think "that one is a real doctor".  Reassured patient, that he is an actual physician and there is not another doctor in the emergency room tonight.

## 2013-08-17 NOTE — ED Notes (Signed)
Pt ambulated to BR independently.  Pt now c/o pain in legs and arms as well.

## 2013-08-17 NOTE — ED Notes (Addendum)
Pt angry that she hasn't been seen and screaming that she can't have water.  Pt demanding to see physician but will not stop talking on the phone to be evaluated by physican.

## 2013-08-17 NOTE — ED Notes (Signed)
Patient is moaning and groaning as if in pain. When asked where she hurts she states everywhere and from the procedure they did on her.

## 2013-08-23 ENCOUNTER — Encounter: Payer: Self-pay | Admitting: Internal Medicine

## 2013-08-23 NOTE — Progress Notes (Signed)
Cc PCP 

## 2013-08-23 NOTE — Progress Notes (Unsigned)
Letter from: Corbin Ade  Reason for Letter: Results Review Send letter to patient.  Send copy of letter with path to referring provider and PCP.  Please offer patient office followup to review symptoms in about a month from now with extender

## 2013-08-23 NOTE — Progress Notes (Signed)
Pt is aware of OV on 10/29 at 1030 with AS and appt card was mailed

## 2013-08-23 NOTE — Progress Notes (Signed)
Letter mailed to pt.  

## 2013-09-22 ENCOUNTER — Ambulatory Visit: Payer: Self-pay | Admitting: Gastroenterology

## 2013-09-22 ENCOUNTER — Telehealth: Payer: Self-pay | Admitting: Gastroenterology

## 2013-09-22 ENCOUNTER — Encounter: Payer: Self-pay | Admitting: Gastroenterology

## 2013-09-22 NOTE — Telephone Encounter (Signed)
MAILED LETTER °

## 2013-09-22 NOTE — Telephone Encounter (Signed)
Pt was a no show

## 2013-10-23 NOTE — Progress Notes (Signed)
REVIEWED.  

## 2013-11-26 NOTE — Progress Notes (Signed)
REVIEWED.  

## 2014-01-11 ENCOUNTER — Ambulatory Visit: Payer: Self-pay | Admitting: Gastroenterology

## 2014-01-15 ENCOUNTER — Encounter: Payer: Self-pay | Admitting: Cardiology

## 2014-01-26 ENCOUNTER — Ambulatory Visit (INDEPENDENT_AMBULATORY_CARE_PROVIDER_SITE_OTHER): Payer: Medicaid Other | Admitting: Gastroenterology

## 2014-01-26 ENCOUNTER — Encounter: Payer: Self-pay | Admitting: Gastroenterology

## 2014-01-26 VITALS — BP 130/72 | HR 71 | Temp 97.6°F | Ht <= 58 in | Wt 128.2 lb

## 2014-01-26 DIAGNOSIS — R109 Unspecified abdominal pain: Secondary | ICD-10-CM

## 2014-01-26 DIAGNOSIS — K219 Gastro-esophageal reflux disease without esophagitis: Secondary | ICD-10-CM

## 2014-01-26 DIAGNOSIS — K5900 Constipation, unspecified: Secondary | ICD-10-CM

## 2014-01-26 MED ORDER — LINACLOTIDE 290 MCG PO CAPS: 290.0000 ug | ORAL_CAPSULE | Freq: Every day | ORAL | Status: DC

## 2014-01-26 NOTE — Assessment & Plan Note (Signed)
Reports good control. Continue current regimen.

## 2014-01-26 NOTE — Progress Notes (Signed)
Primary Care Physician: Monico Blitz, MD  Primary Gastroenterologist:  Garfield Cornea, MD   Chief Complaint  Patient presents with  . Follow-up    HPI: Victoria Townsend is a 53 y.o. female here followup of abdominal pain. She no showed her October 2014 appointment. Last seen back in September at time of her upper endoscopy which showed hyperplastic gastric polyps, gastritis but no H. pylori.   Patient has a history of chronic abdominal pain, constipation, GERD. Evaluation to date includes EGD (gastritis) and colonoscopy by Dr. Posey Pronto in 2012 as outlined below. EGD by Dr. Gala Romney recently. CT in July 2014 which failed to explain her abdominal pain. She had bilateral nonobstructing renal calculi, moderate colonic stool burden. She had low-lying bladder/cystocele at rest.   She has been quite difficult to manage because of her resistance to trying medication. She was offered a couple of different PPIs but insist on taking pantoprazole. On multiple occasions she stated that she was not taking Amitiza or Linzess as provided as well.   She states she continues to have daily abdominal pain. Describes as tingling discomfort in the upper abdomen/right upper quadrant region. Really doesn't feel like is worse with meals or movement. Doesn't seem to necessarily get better with she has a bowel movement although she states she does feel better in general after having a bowel movement. Feels like her heartburn is adequately controlled. She states her constipation is managed with fiber supplements however she tells that she uses a suppository at least once per week because of extreme constipation. Her weight is up 8 pounds. Claim she tried Linzess and Amitiza for couple of weeks and was no better than fiber. Denies melena or rectal bleeding. No nausea or vomiting. Takes aspirin 81 mg twice a day for bowel pain but denies other incidents.  Extensive amount of time spent today with patient voicing her concerns  about her psychiatrist weaning her off of her Xanax. She is also frustrated with not having adequate pain management for her fibromyalgia and abdominal pain. She has been to 2 separate pain clinics in either did not return for followup visit or was let go because of failure to pay per patient.  She reports being discharged from Dr. Freddie Apley pain clinic.    Current Outpatient Prescriptions  Medication Sig Dispense Refill  . acetaminophen (TYLENOL) 500 MG tablet Take 500 mg by mouth every 6 (six) hours as needed.        Marland Kitchen albuterol (VENTOLIN HFA) 108 (90 BASE) MCG/ACT inhaler Inhale 2 puffs into the lungs every 6 (six) hours as needed.        . ALPRAZolam (XANAX) 0.5 MG tablet Take 0.5 mg by mouth 2 (two) times daily.       Marland Kitchen aspirin (ASPIR-LOW) 81 MG EC tablet Take 81 mg by mouth every other day.       . clobetasol (TEMOVATE) 0.05 % cream Apply topically 2 (two) times daily.        Marland Kitchen conjugated estrogens (PREMARIN) vaginal cream Place 1 g vaginally daily.      . Cranberry (AZO-CRANBERRY) 450 MG TABS Take by mouth as needed.        . gabapentin (NEURONTIN) 300 MG capsule Take 300 mg by mouth 2 (two) times daily.      Marland Kitchen nystatin (MYCOSTATIN) ointment Apply topically daily.       . Oxybutynin Chloride (GELNIQUE) 10 % GEL Place onto the skin daily.        Marland Kitchen  pantoprazole (PROTONIX) 40 MG tablet Take 40 mg by mouth 2 (two) times daily.        . psyllium (METAMUCIL) 58.6 % powder Take 1 packet by mouth 3 (three) times daily.      . valACYclovir (VALTREX) 1000 MG tablet Take 1,000 mg by mouth as needed.       . Verapamil HCl CR 300 MG CP24 Take 180 mg by mouth daily.        No current facility-administered medications for this visit.    Allergies as of 01/26/2014 - Review Complete 01/26/2014  Allergen Reaction Noted  . Ciprofloxacin Swelling 04/12/2013  . Codeine    . Ibuprofen    . Shellfish allergy  08/17/2013  . Shellfish allergy Rash 04/12/2013   Past Surgical History  Procedure  Laterality Date  . Cesarean section with bilateral tubal ligation    . Bladder polyps    . Urethral stricture dilatation    . Colonoscopy  May 2012    Dr. Posey Pronto: scattered diverticula pancolonic  . Esophagogastroduodenoscopy  April 2012    Dr. Posey Pronto: gastritis  . Esophagogastroduodenoscopy N/A 08/09/2013    IDP:OEUMPNT polyps/abnormal gastric because of uncertain significance-status post biopsy. Small hiatal [employer].es chronic inflammation on bx. hyperplastic polyp  . Cholecystectomy    . Appendectomy      ROS:  General: Negative for anorexia, weight loss, fever, chills, fatigue, weakness. ENT: Negative for hoarseness, difficulty swallowing , nasal congestion. CV: Negative for chest pain, angina, palpitations, dyspnea on exertion, peripheral edema.  Respiratory: Negative for dyspnea at rest, dyspnea on exertion, cough, sputum, wheezing.  GI: See history of present illness. GU:  Negative for dysuria, hematuria, urinary incontinence, urinary frequency, nocturnal urination.  Endo: Negative for unusual weight change.    Physical Examination:   BP 130/72  Pulse 71  Temp(Src) 97.6 F (36.4 C) (Oral)  Ht 4\' 10"  (1.473 m)  Wt 128 lb 3.2 oz (58.151 kg)  BMI 26.80 kg/m2  General: Well-nourished, well-developed in no acute distress.  Eyes: No icterus. Mouth: Oropharyngeal mucosa moist and pink , no lesions erythema or exudate. Lungs: Clear to auscultation bilaterally.  Heart: Regular rate and rhythm, no murmurs rubs or gallops.  Abdomen: Bowel sounds are normal, nontender, nondistended, no hepatosplenomegaly or masses, no abdominal bruits or hernia , no rebound or guarding.   Extremities: No lower extremity edema. No clubbing or deformities. Neuro: Alert and oriented x 4   Skin: Warm and dry, no jaundice.   Psych: Alert and cooperative, normal mood and affect.

## 2014-01-26 NOTE — Assessment & Plan Note (Signed)
Chronic upper abdominal pain in setting of GERD, gastritis, constipation. Work-up thus far reassuring. ?neuropathic abd pain/adhesive disease? Would like for her to get significant trial of Linzess. Will refer to pain management in [place].   Patient is requesting referral to a different psychiatrist (?Dr. Lin Landsman) due to dissatisfaction with current one. I have advised her to follow up with Dr. Manuella Ghazi to discuss this issue.

## 2014-01-26 NOTE — Patient Instructions (Signed)
1. Resume Linzess 256mcg daily. RX sent to your pharmacy. 2. Referral to pain clinic in [place]. 3. Office visit in 6 weeks with Dr. Gala Romney.

## 2014-01-27 NOTE — Progress Notes (Signed)
cc'd to pcp 

## 2014-03-02 ENCOUNTER — Telehealth: Payer: Self-pay | Admitting: Gastroenterology

## 2014-03-02 NOTE — Telephone Encounter (Signed)
Please let patient know we have tried [facility] management Dr. Theda Sers and [facility] does not treat Neuropathic Abdominal Pain, or any Abdominal Pain.  Please request she keep OV this month with [facility] for f/u and further recommendations regarding chronic abdominal pain.

## 2014-03-02 NOTE — Progress Notes (Signed)
I spoke to patients daughter and she understood and will relay this to her mother

## 2014-03-07 NOTE — Telephone Encounter (Signed)
Tried to call pt- NA 

## 2014-03-10 NOTE — Telephone Encounter (Signed)
Tried to call pt- NA 

## 2014-03-11 NOTE — Telephone Encounter (Signed)
Tried to call pt this morning. NA. Pt has ov with [facility] on 03/15/14.

## 2014-03-15 ENCOUNTER — Telehealth: Payer: Self-pay | Admitting: Internal Medicine

## 2014-03-15 ENCOUNTER — Telehealth: Payer: Self-pay | Admitting: *Deleted

## 2014-03-15 ENCOUNTER — Ambulatory Visit: Payer: Self-pay | Admitting: Internal Medicine

## 2014-03-15 NOTE — Telephone Encounter (Signed)
Pt called stating she has been using the bathroom really good but she still has the numbness tingling and soreness in her stomach, pt is not taking the linzess, pt is still taking filber thearpy. Pt has a appointment with [facility] at 9:30 04/12/14. Please advise [phone] this number is [employer] she is pt's neighbor. If no answer LMOM. Pt said her stomach feels really sore between her rib cage. Pt said this feeling she is having in her abd. Is making her "freak out". Pt is concerned pt wants her pancreatis and liver looked at.

## 2014-03-15 NOTE — Telephone Encounter (Signed)
Pt was a no show

## 2014-03-15 NOTE — Telephone Encounter (Signed)
Pt did not show for her appointment today. She stated that the [facility] did not pick her up. I called [facility] and there was not ticket for them to pick her up, so there for she did not call to make them aware.

## 2014-04-12 ENCOUNTER — Ambulatory Visit: Payer: Self-pay | Admitting: Internal Medicine

## 2014-04-12 ENCOUNTER — Telehealth: Payer: Self-pay | Admitting: Internal Medicine

## 2014-04-12 ENCOUNTER — Encounter: Payer: Self-pay | Admitting: Internal Medicine

## 2014-04-12 NOTE — Telephone Encounter (Signed)
Pt was a no show

## 2014-04-12 NOTE — Telephone Encounter (Signed)
Mailed letter °

## 2014-06-07 ENCOUNTER — Ambulatory Visit (INDEPENDENT_AMBULATORY_CARE_PROVIDER_SITE_OTHER): Payer: Medicaid Other | Admitting: Internal Medicine

## 2014-06-07 ENCOUNTER — Encounter (INDEPENDENT_AMBULATORY_CARE_PROVIDER_SITE_OTHER): Payer: Self-pay

## 2014-06-07 ENCOUNTER — Other Ambulatory Visit: Payer: Self-pay | Admitting: Internal Medicine

## 2014-06-07 ENCOUNTER — Encounter: Payer: Self-pay | Admitting: Internal Medicine

## 2014-06-07 VITALS — BP 150/79 | HR 69 | Temp 98.1°F | Resp 18 | Ht <= 58 in | Wt 130.0 lb

## 2014-06-07 DIAGNOSIS — G8929 Other chronic pain: Secondary | ICD-10-CM

## 2014-06-07 DIAGNOSIS — R1013 Epigastric pain: Secondary | ICD-10-CM

## 2014-06-07 DIAGNOSIS — Z719 Counseling, unspecified: Secondary | ICD-10-CM

## 2014-06-07 NOTE — Progress Notes (Signed)
Primary Care Physician:  Monico Blitz, MD Primary Gastroenterologist:  Dr. Gala Romney  Pre-Procedure History & Physical: HPI:  Victoria Townsend is a 53 y.o. female here for followup of unrelenting upper abdominal pain. Patient states she's had this pain for years it started after she used crack. She feels like "bee stings" across her abdomen. Has been significantly constipated. She has a history of noncompliance in this office. She missed her last appointment with me. She was prescribed Linzess  previously. She stated that she did not need Linzess for constipation. She states she takes a fiber supplement now and her bowels move once daily.  This lady has had an exhausting workup by [place] and others. She has  not been found to have any organic process which would warant specific therapy or surgery. She tells me she is convinced that her intestines or "going backwards" as she reports she was told by Dr. Posey Pronto. She further states she needs an operation to straighten them out. She has not lost any weight. She's not had any fever or bleeding. She perseverates on abdominal pain throughout my interview and insist sh needs an 'operation".  Past Medical History  Diagnosis Date  . GERD (gastroesophageal reflux disease)   . Essential hypertension, benign   . Bladder polyps   . Nephrolithiasis   . Chest pain     Cardiolite 2009, no ischemia, EF 53%  . Anxiety disorder     Historically has seen a psychiatrist  . Asthma   . Shoulder pain     Shoulder has been injected  . Polysubstance abuse     History of cocaine, marijuana  . Agoraphobia   . PTSD (post-traumatic stress disorder)   . Irregular heartbeat   . Hypertension     Past Surgical History  Procedure Laterality Date  . Cesarean section with bilateral tubal ligation    . Bladder polyps    . Urethral stricture dilatation    . Colonoscopy  May 2012    Dr. Posey Pronto: scattered diverticula pancolonic  . Esophagogastroduodenoscopy  April 2012    Dr.  Posey Pronto: gastritis  . Esophagogastroduodenoscopy N/A 08/09/2013    GEX:[id number] polyps/abnormal gastric because of uncertain significance-status post biopsy. Small hiatal PrismBlog.es chronic inflammation on bx. hyperplastic polyp  . Cholecystectomy    . Appendectomy      Prior to Admission medications   Medication Sig Start Date End Date Taking? Authorizing Provider  acetaminophen (TYLENOL) 500 MG tablet Take 500 mg by mouth every 6 (six) hours as needed.     Yes Historical Provider, MD  albuterol (VENTOLIN HFA) 108 (90 BASE) MCG/ACT inhaler Inhale 2 puffs into the lungs every 6 (six) hours as needed.     Yes Historical Provider, MD  ALPRAZolam Duanne Moron) 0.25 MG tablet Take 0.25 mg by mouth at bedtime as needed for anxiety.   Yes Historical Provider, MD  aspirin (ASPIR-LOW) 81 MG EC tablet Take 81 mg by mouth every other day.    Yes Historical Provider, MD  clobetasol (TEMOVATE) 0.05 % cream Apply topically 2 (two) times daily.     Yes Historical Provider, MD  gabapentin (NEURONTIN) 300 MG capsule Take 300 mg by mouth 2 (two) times daily.   Yes Historical Provider, MD  Oxybutynin Chloride (GELNIQUE) 10 % GEL Place onto the skin daily.     Yes Historical Provider, MD  pantoprazole (PROTONIX) 40 MG tablet Take 40 mg by mouth 2 (two) times daily.     Yes Historical Provider, MD  psyllium (  METAMUCIL) 58.6 % powder Take 1 packet by mouth 3 (three) times daily.   Yes Historical Provider, MD  valACYclovir (VALTREX) 1000 MG tablet Take 1,000 mg by mouth as needed.    Yes Historical Provider, MD  conjugated estrogens (PREMARIN) vaginal cream Place 1 g vaginally daily.    Historical Provider, MD  Cranberry (AZO-CRANBERRY) 450 MG TABS Take by mouth as needed.      Historical Provider, MD  Linaclotide Rolan Lipa) 290 MCG CAPS capsule Take 1 capsule (290 mcg total) by mouth daily. 01/26/14   Mahala Menghini, PA-C  nystatin (MYCOSTATIN) ointment Apply topically daily.     Historical Provider, MD  Verapamil HCl CR 300  MG CP24 Take 180 mg by mouth daily.     Historical Provider, MD    Allergies as of 06/07/2014 - Review Complete 06/07/2014  Allergen Reaction Noted  . Ciprofloxacin Swelling 04/12/2013  . Codeine    . Ibuprofen    . Shellfish allergy  08/17/2013  . Shellfish allergy Rash 04/12/2013    Family History  Problem Relation Age of Onset  . Coronary artery disease Mother     CABG  . Colon cancer Paternal Uncle     History   Social History  . Marital Status: Single    Spouse Name: N/A    Number of Children: N/A  . Years of Education: N/A   Occupational History  . Not on file.   Social History Main Topics  . Smoking status: Current Every Day Smoker  . Smokeless tobacco: Not on file  . Alcohol Use: No     Comment: Former user  . Drug Use: No     Comment: Last cocaine use 04/2013; actively smoking marijuana, "once in blue moon"  . Sexual Activity: Not on file   Other Topics Concern  . Not on file   Social History Narrative   ** Merged History Encounter **        Review of Systems: See HPI, otherwise negative ROS  Physical Exam: BP 150/79  Pulse 69  Temp(Src) 98.1 F (36.7 C) (Oral)  Resp 18  Ht 4\' 10"  (1.473 m)  Wt 130 lb (58.968 kg)  BMI 27.18 kg/m2 General:   Alert,  Well-developed, well-nourished, pleasant and cooperative in NAD Skin:  Intact without significant lesions or rashes. Eyes:  Sclera clear, no icterus.   Conjunctiva pink. Ears:  Normal auditory acuity. Nose:  No deformity, discharge,  or lesions. Mouth:  No deformity or lesions. Neck:  Supple; no masses or thyromegaly. No significant cervical adenopathy. Lungs:  Clear throughout to auscultation.   No wheezes, crackles, or rhonchi. No acute distress. Heart:  Regular rate and rhythm; no murmurs, clicks, rubs,  or gallops. Abdomen: Non-distended, normal bowel sounds.  Soft and nontender without appreciable mass or hepatosplenomegaly.  Pulses:  Normal pulses noted. Extremities:  Without clubbing or  edema.  Impression:   Long-standing chronic abdominal pain-the etiology of which is not clear. She has been exhaustively evaluated here and elsewhere. Chronic abdominal pain on top of associated psychiatric illness and history of polysubstance abuse. Patient demands an operation today. I spent a long time talking to her about the multitude of negative studies and the chronicity of her pain over a period of years. I told her that I did not feel that she had anything going on to warrant an operation. She really needs a positive relationship with a pain management physician. Her noncompliance and her inability to maintain a positive Dr. Marland Kitchen  patient relationship is undermining provider efforts at helping this lady.   Recommendations:  Because of this lady's insistence, we'll go ahead and make her appointment with Dr. Arnoldo Morale so he can take a look at her and decide whether or not there's any type of operative intervention that might help her as a second opinion. I strongly recommended the patient develop relationship with a pain management practice.  I have no further specific recommendations from a gastroenterology perspective at this time.    Notice: This dictation was prepared with Dragon dictation along with smaller phrase technology. Any transcriptional errors that result from this process are unintentional and may not be corrected upon review.

## 2014-06-07 NOTE — Patient Instructions (Signed)
You have chronic abdominal pain which is likely not curable.  I do not feel you need any kind of operation  You would benefit from seeing a pain management physician  Since you insist on seeing a surgery, we will make you an appointment to see Dr. Arnoldo Morale to get a second opinion regarding any possible surgical issues

## 2014-06-08 ENCOUNTER — Telehealth: Payer: Self-pay

## 2014-06-08 NOTE — Telephone Encounter (Signed)
-----   Message -----  From: Daneil Dolin, MD  Sent: 06/07/2014 12:44 PM  To: Anell Barr, LPN  Subject: RE: REMARKS AT DISCHARGE   Patient has long-standing abdominal pain. Both her liver and pancreas were checked with blood work and a CT scan just one year ago. Things really have not changed which would warrant repeat testing at this time.  ----- Message -----  From: Anell Barr, LPN  Sent: 01/16/9797 11:08 AM  To: Daneil Dolin, MD  Subject: REMARKS AT DISCHARGE   Dr. Gala Romney,   When I discharged the pt I reviewed your plan with her. She said she just could not see how she could benefit from pain management when what ever is wrong is still there.   She also said " I thought I would have at least got my liver and pancreas checked" , but nothing! " My feelings are hurt!"   I just told pt I would make you aware of her concerns.

## 2014-06-13 ENCOUNTER — Encounter: Payer: Self-pay | Admitting: Internal Medicine

## 2014-06-16 ENCOUNTER — Encounter: Payer: Self-pay | Admitting: Cardiology

## 2014-06-29 ENCOUNTER — Telehealth: Payer: Self-pay | Admitting: Internal Medicine

## 2014-06-29 NOTE — Telephone Encounter (Signed)
I returned call and told patient her appointment date & time

## 2014-06-29 NOTE — Telephone Encounter (Signed)
Pt LMOM while I was at lunch that she needed to know when she needed to see Dr Arnoldo Morale so she could make arrangement with [facility] and was upset because no one would pick up the phone and that she would call back in 30 minutes and maybe someone would pick up the phone. I called her home number [phone] and woman that sounds like patient answered and said that it was her daughter. I told her to let Mrs Soule know that I was returning her call. Then I called her cell phone number [phone] several times and could not get an answer and a VM. Patient should call Dr Adline Mango office if she needs to know when her appointment is and to also know that we do take lunch and someone isn't always available to answer the phones during that time.

## 2014-07-12 ENCOUNTER — Telehealth: Payer: Self-pay | Admitting: Internal Medicine

## 2014-07-12 NOTE — Telephone Encounter (Signed)
Pt was scheduled to see Dr Arnoldo Morale today. Lambert Keto called to let us know that she was a no show.

## 2014-07-12 NOTE — Telephone Encounter (Signed)
noted 

## 2014-07-12 NOTE — Telephone Encounter (Signed)
NOTED

## 2014-07-12 NOTE — Telephone Encounter (Signed)
Noted  

## 2014-07-29 ENCOUNTER — Ambulatory Visit: Payer: Self-pay | Admitting: Cardiology

## 2014-08-16 ENCOUNTER — Ambulatory Visit (INDEPENDENT_AMBULATORY_CARE_PROVIDER_SITE_OTHER): Payer: Medicaid Other | Admitting: Cardiology

## 2014-08-16 ENCOUNTER — Encounter: Payer: Self-pay | Admitting: Cardiology

## 2014-08-16 VITALS — BP 120/72 | HR 67 | Ht <= 58 in | Wt 134.8 lb

## 2014-08-16 DIAGNOSIS — Z720 Tobacco use: Secondary | ICD-10-CM

## 2014-08-16 DIAGNOSIS — R0789 Other chest pain: Secondary | ICD-10-CM

## 2014-08-16 DIAGNOSIS — I10 Essential (primary) hypertension: Secondary | ICD-10-CM

## 2014-08-16 DIAGNOSIS — F17200 Nicotine dependence, unspecified, uncomplicated: Secondary | ICD-10-CM

## 2014-08-16 DIAGNOSIS — F411 Generalized anxiety disorder: Secondary | ICD-10-CM

## 2014-08-16 NOTE — Progress Notes (Signed)
Clinical Summary Victoria Townsend is a 53 y.o.female seen in the office back in July 2014 in referral from Dr. Manuella Ghazi with atypical chest pain. She called the office and arranged this followup visit. She tells me that Dr. Manuella Ghazi has released her from his practice, and she has now established with Dr. Cindie Laroche, in fact sees him again tomorrow. Reviewed recent GI workup per Dr. Gala Romney as well.  She was seen previously in 2009 with atypical chest pain symptoms. Adenosine Cardiolite in October 2009 was negative for ischemia. More recently she underwent an exercise echocardiogram that was also negative for ischemia in November 2013.  [name] describes multiple symptoms today. She describes a feeling of "bee stings" in her epigastric to lower sternal area, sometimes a very sharp intense discomfort on the left side of her chest that takes her breath away. The symptoms are sporadic without any known precipitant, nonexertional in character typically, sometimes she feels short of breath and has to use her MDI more frequently. She feels very anxious about these symptoms, states that she "needs to be back on nerve medication." She tells me that she is not followed with anybody in behavioral health at this point. States she feels like her "kidneys are falling out" and also that her right arm "goes out" - referring to pain. She reports problems with her intestines.  I reviewed the results of her stress testing as noted above. ECG today is normal. Description of symptoms is very atypical for angina.   Allergies  Allergen Reactions  . Ciprofloxacin Swelling    Facial swelling  . Codeine     REACTION: n/v  . Erythromycin Other (See Comments)    ??  . Ibuprofen   . Shellfish Allergy   . Shellfish Allergy Rash    Current Outpatient Prescriptions  Medication Sig Dispense Refill  . acetaminophen (TYLENOL) 500 MG tablet Take 500 mg by mouth every 6 (six) hours as needed.        Marland Kitchen albuterol (VENTOLIN HFA) 108 (90  BASE) MCG/ACT inhaler Inhale 2 puffs into the lungs every 6 (six) hours as needed.        . ALPRAZolam (XANAX) 0.25 MG tablet Take 0.25 mg by mouth at bedtime as needed for anxiety.      Marland Kitchen aspirin (ASPIR-LOW) 81 MG EC tablet Take 81 mg by mouth every other day.       . clobetasol (TEMOVATE) 0.05 % cream Apply topically 2 (two) times daily.        Marland Kitchen dicyclomine (BENTYL) 20 MG tablet Take 20 mg by mouth as needed for spasms.      Marland Kitchen estrogen, conjugated,-medroxyprogesterone (PREMPRO) 0.3-1.5 MG per tablet Take 1 tablet by mouth daily.      Marland Kitchen gabapentin (NEURONTIN) 300 MG capsule Take 300 mg by mouth 2 (two) times daily.      Marland Kitchen lisinopril (PRINIVIL,ZESTRIL) 20 MG tablet Take 20 mg by mouth daily.      . Oxybutynin Chloride (GELNIQUE) 10 % GEL Place onto the skin daily.        . Oxybutynin Chloride (GELNIQUE) 10 % GEL 1 packet daily.      Marland Kitchen oxycodone (OXY-IR) 5 MG capsule Take 5 mg by mouth as needed.      . pantoprazole (PROTONIX) 40 MG tablet Take 40 mg by mouth 2 (two) times daily.        . psyllium (METAMUCIL) 58.6 % powder Take 1 packet by mouth 3 (three) times daily.      Marland Kitchen  valACYclovir (VALTREX) 1000 MG tablet Take 500 mg by mouth every other day.       . verapamil (CALAN-SR) 180 MG CR tablet Take 180 mg by mouth daily.       No current facility-administered medications for this visit.    Past Medical History  Diagnosis Date  . GERD (gastroesophageal reflux disease)   . Essential hypertension, benign   . Bladder polyps   . Nephrolithiasis   . Chest pain     Cardiolite 2009, no ischemia, EF 53%  . Anxiety disorder     Historically has seen a psychiatrist  . Asthma   . Shoulder pain     Shoulder has been injected  . Polysubstance abuse     History of cocaine, marijuana  . Agoraphobia   . PTSD (post-traumatic stress disorder)   . Irregular heartbeat     Past Surgical History  Procedure Laterality Date  . Cesarean section with bilateral tubal ligation    . Bladder polyps    .  Urethral stricture dilatation    . Colonoscopy  May 2012    Dr. Posey Pronto: scattered diverticula pancolonic  . Esophagogastroduodenoscopy  April 2012    Dr. Posey Pronto: gastritis  . Esophagogastroduodenoscopy N/A 08/09/2013    [id number] polyps/abnormal gastric because of uncertain significance-status post biopsy. Small hiatal PrismBlog.es chronic inflammation on bx. hyperplastic polyp  . Cholecystectomy    . Appendectomy      Family History  Problem Relation Age of Onset  . Coronary artery disease Mother     CABG  . Colon cancer Paternal Uncle     Social History Ms. Galvan reports that she has been smoking.  She does not have any smokeless tobacco history on file. Ms. Soller reports that she does not drink alcohol.  Review of Systems Denies any fevers or chills. Has problems with constipation and diarrhea. Reports stable appetite. Headaches. Systems otherwise reviewed and outlined above.  Physical Examination Filed Vitals:   08/16/14 1434  BP: 120/72  Pulse: 67   Filed Weights   08/16/14 1434  Weight: 134 lb 12.8 oz (61.145 kg)    Chronically ill-appearing woman, no distress.  HEENT: Conjunctiva and lids normal, oropharynx clear with poor dentition.  Neck: Supple, no elevated JVP or carotid bruits, no thyromegaly.  Lungs: Diminished breath sounds but clear to auscultation, nonlabored breathing at rest.  Cardiac: Regular rate and rhythm, no S3 or significant systolic murmur, no pericardial rub.  Abdomen: Soft, nontender, bowel sounds present.  Extremities: No pitting edema, distal pulses 2+.  Skin: Warm and dry.  Musculoskeletal: No kyphosis.  Neuropsychiatric: Alert and oriented x3, affect grossly appropriate.   Problem List and Plan   Atypical chest pain Patient presents with multiple complaints, most of which seem to be chronic based on description. Her description of thoracic discomfort would be quite atypical for angina. She has had objective ischemic workup over the  last 6 years, documented above. ECG today is normal. I discussed the reassuring results of her stress testing, would not recommend a followup stress test at this particular time. I do think it is important however for her to focus on positive lifestyle changes, she needs to stop smoking, also maintain followup with a primary care provider for risk factor modification, management of lipids as needed, and also searching for other causes of her symptoms. Denies any further substance abuse since last year (crack cocaine and previously marijuana). Perhaps through primary care she can be referred to a behavioral health specialist that  may also help her.  Essential hypertension, benign Blood pressure is normal today. She is on an ACE inhibitor and calcium channel blocker. Keep followup with Dr. Cindie Laroche.  Anxiety disorder Patient states she is not following with a behavioral health specialists at this time. I have encouraged her to discuss this with Dr. Cindie Laroche.  Tobacco abuse Smoking cessation was discussed.    Satira Sark, M.D., F.A.C.C.

## 2014-08-16 NOTE — Assessment & Plan Note (Signed)
Patient states she is not following with a behavioral health specialists at this time. I have encouraged her to discuss this with Dr. Cindie Laroche.

## 2014-08-16 NOTE — Patient Instructions (Signed)
Your physician recommends that you schedule a follow-up appointment in: as needed Your physician recommends that you continue on your current medications as directed. Please refer to the Current Medication list given to you today.   

## 2014-08-16 NOTE — Assessment & Plan Note (Signed)
Blood pressure is normal today. She is on an ACE inhibitor and calcium channel blocker. Keep followup with Dr. Cindie Laroche.

## 2014-08-16 NOTE — Assessment & Plan Note (Signed)
Patient presents with multiple complaints, most of which seem to be chronic based on description. Her description of thoracic discomfort would be quite atypical for angina. She has had objective ischemic workup over the last 6 years, documented above. ECG today is normal. I discussed the reassuring results of her stress testing, would not recommend a followup stress test at this particular time. I do think it is important however for her to focus on positive lifestyle changes, she needs to stop smoking, also maintain followup with a primary care provider for risk factor modification, management of lipids as needed, and also searching for other causes of her symptoms. Denies any further substance abuse since last year (crack cocaine and previously marijuana). Perhaps through primary care she can be referred to a behavioral health specialist that may also help her.

## 2014-08-16 NOTE — Assessment & Plan Note (Signed)
Smoking cessation was discussed. 

## 2015-02-10 ENCOUNTER — Ambulatory Visit (HOSPITAL_COMMUNITY)
Admission: RE | Admit: 2015-02-10 | Discharge: 2015-02-10 | Disposition: A | Discharge status: Home / Self Care | Payer: Medicaid Other | Source: Ambulatory Visit | Attending: Family Medicine | Admitting: Family Medicine

## 2015-02-10 ENCOUNTER — Other Ambulatory Visit (HOSPITAL_COMMUNITY): Payer: Self-pay | Admitting: Family Medicine

## 2015-02-10 DIAGNOSIS — M19011 Primary osteoarthritis, right shoulder: Secondary | ICD-10-CM | POA: Diagnosis present

## 2015-02-10 DIAGNOSIS — M545 Low back pain: Secondary | ICD-10-CM | POA: Insufficient documentation

## 2015-02-10 DIAGNOSIS — N200 Calculus of kidney: Secondary | ICD-10-CM | POA: Insufficient documentation

## 2015-02-10 DIAGNOSIS — M7591 Shoulder lesion, unspecified, right shoulder: Secondary | ICD-10-CM | POA: Diagnosis not present

## 2015-09-29 ENCOUNTER — Encounter (HOSPITAL_COMMUNITY): Payer: Self-pay

## 2015-09-29 ENCOUNTER — Encounter: Payer: Self-pay | Admitting: *Deleted

## 2015-09-29 ENCOUNTER — Emergency Department (HOSPITAL_COMMUNITY)
Admission: EM | Admit: 2015-09-29 | Discharge: 2015-09-30 | Disposition: A | Discharge status: Home / Self Care | Payer: Medicaid Other | Attending: Emergency Medicine | Admitting: Emergency Medicine

## 2015-09-29 ENCOUNTER — Telehealth: Payer: Self-pay | Admitting: *Deleted

## 2015-09-29 DIAGNOSIS — R200 Anesthesia of skin: Secondary | ICD-10-CM | POA: Diagnosis not present

## 2015-09-29 DIAGNOSIS — Z7982 Long term (current) use of aspirin: Secondary | ICD-10-CM | POA: Insufficient documentation

## 2015-09-29 DIAGNOSIS — Z87442 Personal history of urinary calculi: Secondary | ICD-10-CM | POA: Diagnosis not present

## 2015-09-29 DIAGNOSIS — K219 Gastro-esophageal reflux disease without esophagitis: Secondary | ICD-10-CM | POA: Diagnosis not present

## 2015-09-29 DIAGNOSIS — Z8742 Personal history of other diseases of the female genital tract: Secondary | ICD-10-CM | POA: Insufficient documentation

## 2015-09-29 DIAGNOSIS — R51 Headache: Secondary | ICD-10-CM | POA: Insufficient documentation

## 2015-09-29 DIAGNOSIS — J45901 Unspecified asthma with (acute) exacerbation: Secondary | ICD-10-CM | POA: Insufficient documentation

## 2015-09-29 DIAGNOSIS — I10 Essential (primary) hypertension: Secondary | ICD-10-CM | POA: Diagnosis not present

## 2015-09-29 DIAGNOSIS — Z720 Tobacco use: Secondary | ICD-10-CM | POA: Diagnosis not present

## 2015-09-29 DIAGNOSIS — F419 Anxiety disorder, unspecified: Secondary | ICD-10-CM | POA: Insufficient documentation

## 2015-09-29 DIAGNOSIS — Z79899 Other long term (current) drug therapy: Secondary | ICD-10-CM | POA: Diagnosis not present

## 2015-09-29 DIAGNOSIS — R202 Paresthesia of skin: Secondary | ICD-10-CM | POA: Diagnosis present

## 2015-09-29 DIAGNOSIS — R109 Unspecified abdominal pain: Secondary | ICD-10-CM | POA: Diagnosis not present

## 2015-09-29 DIAGNOSIS — R519 Headache, unspecified: Secondary | ICD-10-CM

## 2015-09-29 NOTE — ED Notes (Signed)
Pt stable, ambulatory, states understanding of discharge instructions 

## 2015-09-29 NOTE — Telephone Encounter (Signed)
Pt c/o numbness on L side/weakness/tingling in head, and some chest pain off and on. Requesting to be seen today, with symptoms pt describing told pt ED would need to evaluate. Pt says she was seen last week at [facility] for same symptoms and kept her 1 night with HR at 40 according to pt, will request records. Offered to call EMS and daughter for pt and pt declined 2 times. Pt says daughter will take her to [facility] again offered to call EMS/daughter and pt declined, wanted to call daughter herself. [facility] office scheduled pt for 11/14 appt and offered appt today with NP in [place] that pt declined.

## 2015-09-29 NOTE — ED Provider Notes (Signed)
CSN: [id number]     Arrival date & time 09/29/15  2103 History   First MD Initiated Contact with Patient 09/29/15 2144     Chief Complaint  Patient presents with  . Numbness     (Consider location/radiation/quality/duration/timing/severity/associated sxs/prior Treatment) HPI Victoria Townsend is a 54 y.o. female with a history of anxiety disorder, PTSD, comes in for evaluation of headache and numbness. Patient has multiple medical complaints at this time. She reports she has had numbness and tingling to her bilateral hands for the past 4 days, burning sensation in her bilateral feet, intermittent headaches for the past 3 weeks that are sharp, shooting and shocking in nature, intermittent abdominal pain, slow heart rate and also intermittent shortness of breath. Patient's PCP is Dr. Cindie Townsend and patient saw him yesterday and had a normal workup. She reports she was referred to cardiology for evaluation of slow heart rate. Patient's heart rate upon arrival in the ED is 71 bpm. No other fevers, chills, chest pain, rash, syncope, nausea or vomiting, diarrhea or constipation, urinary symptoms. She denies any environmental exposures. Denies any pain now in the ED.  Past Medical History  Diagnosis Date  . GERD (gastroesophageal reflux disease)   . Essential hypertension, benign   . Bladder polyps   . Nephrolithiasis   . Chest pain     Cardiolite 2009, no ischemia, EF 53%  . Anxiety disorder     Historically has seen a psychiatrist  . Asthma   . Shoulder pain     Shoulder has been injected  . Polysubstance abuse     History of cocaine, marijuana  . Agoraphobia   . PTSD (post-traumatic stress disorder)   . Irregular heartbeat    Past Surgical History  Procedure Laterality Date  . Cesarean section with bilateral tubal ligation    . Bladder polyps    . Urethral stricture dilatation    . Colonoscopy  May 2012    Dr. Posey Pronto: scattered diverticula pancolonic  . Esophagogastroduodenoscopy  April  2012    Dr. Posey Pronto: gastritis  . Esophagogastroduodenoscopy N/A 08/09/2013    QMG:[id number] polyps/abnormal gastric because of uncertain significance-status post biopsy. Small hiatal PrismBlog.es chronic inflammation on bx. hyperplastic polyp  . Cholecystectomy    . Appendectomy     Family History  Problem Relation Age of Onset  . Coronary artery disease Mother     CABG  . Colon cancer Paternal Uncle    Social History  Substance Use Topics  . Smoking status: Current Every Day Smoker  . Smokeless tobacco: None  . Alcohol Use: No     Comment: Former user   OB History    No data available     Review of Systems A 10 point review of systems was completed and was negative except for pertinent positives and negatives as mentioned in the history of present illness     Allergies  Ciprofloxacin; Codeine; Ibuprofen; Erythromycin; and Shellfish allergy  Home Medications   Prior to Admission medications   Medication Sig Start Date End Date Taking? Authorizing Provider  acetaminophen (TYLENOL) 500 MG tablet Take 1,000 mg by mouth every 6 (six) hours as needed for moderate pain.    Yes Historical Provider, MD  albuterol (VENTOLIN HFA) 108 (90 BASE) MCG/ACT inhaler Inhale 2 puffs into the lungs every 6 (six) hours as needed for shortness of breath.    Yes Historical Provider, MD  ALPRAZolam (XANAX) 0.25 MG tablet Take 0.25 mg by mouth 3 (three) times daily as  needed for anxiety.    Yes Historical Provider, MD  aspirin (ASPIR-LOW) 81 MG EC tablet Take 81 mg by mouth daily as needed (for chest pain).    Yes Historical Provider, MD  clobetasol (TEMOVATE) 0.05 % cream Apply 1 application topically daily as needed (for genitals).    Yes Historical Provider, MD  gabapentin (NEURONTIN) 300 MG capsule Take 300 mg by mouth 2 (two) times daily.   Yes Historical Provider, MD  Oxybutynin Chloride (GELNIQUE) 10 % GEL Place onto the skin daily.     Yes Historical Provider, MD  pantoprazole (PROTONIX) 40 MG  tablet Take 40 mg by mouth 2 (two) times daily.     Yes Historical Provider, MD  valACYclovir (VALTREX) 1000 MG tablet Take 500 mg by mouth every other day.    Yes Historical Provider, MD  verapamil (CALAN-SR) 180 MG CR tablet Take 180 mg by mouth daily.    Historical Provider, MD   BP 150/83 mmHg  Pulse 75  Temp(Src) 97.4 F (36.3 C) (Oral)  Resp 14  SpO2 100% Physical Exam  Constitutional: She is oriented to person, place, and time. She appears well-developed and well-nourished.  HENT:  Head: Normocephalic and atraumatic.  Mouth/Throat: Oropharynx is clear and moist.  Eyes: Conjunctivae are normal. Pupils are equal, round, and reactive to light. Right eye exhibits no discharge. Left eye exhibits no discharge. No scleral icterus.  Neck: Neck supple.  Cardiovascular: Normal rate, regular rhythm and normal heart sounds.   Pulmonary/Chest: Effort normal and breath sounds normal. No respiratory distress. She has no wheezes. She has no rales.  Abdominal: Soft. There is no tenderness.  Musculoskeletal: She exhibits no tenderness.  Neurological: She is alert and oriented to person, place, and time.  Cranial Nerves II-XII grossly intact. No facial droop. Motor strength is 5/5 in all 4 extremities. Sensation intact to light touch. Completes finger to nose coordination movements without difficulty. No pronator drift. Gait is baseline  Skin: Skin is warm and dry. No rash noted.  Psychiatric: She has a normal mood and affect.  Nursing note and vitals reviewed.   ED Course  Procedures (including critical care time) Labs Review Labs Reviewed - No data to display  Imaging Review No results found. I have personally reviewed and evaluated these images and lab results as part of my medical decision-making.   EKG Interpretation None     Filed Vitals:   09/29/15 2215 09/29/15 2300 09/29/15 2330 09/29/15 2345  BP: 129/87 124/62 131/85 150/83  Pulse: 69 67 66 75  Temp:      TempSrc:       Resp:      SpO2: 98% 99% 100% 100%    MDM  Vitals stable - WNL -afebrile Pt resting comfortably in ED. PE--physical exam as above and not concerning. Nonfocal neuro exam.  DDX--patient presents with multiple medical complaints. No objective findings on exam. Symptoms intermittent and ongoing for the past 4 days Low suspicion for CVA or other central lesion. Discussed follow-up with PCP in the next 2-3 days for reevaluation. Also encouraged lifestyle modification and cessation of smoking.  I discussed all relevant lab findings and imaging results with pt and they verbalized understanding. Discussed f/u with PCP within 48 hrs and return precautions, pt very amenable to plan. Prior to patient discharge, I discussed and reviewed this case with Dr.Oni   Final diagnoses:  Paresthesia of both hands  Nonintractable headache, unspecified chronicity pattern, unspecified headache type      Marland Kitchen  Cyra Spader, PA-C 09/30/15 0058  Comer Locket, PA-C 10/02/15 1617  Everlene Balls, MD 10/02/15 [phone]

## 2015-09-29 NOTE — ED Notes (Signed)
Pt here for numbness to face, arms, hands bilaterally, ongoing for 3 weeks. She also states her feet feels "like a burning sensation," hx of gout. She also reports "piercing, shooting, shocking feeling" headache that started 3 days ago. Pt A&OX4, ambulatory with steady gait, NAD, no neuro deficits noted.

## 2015-09-29 NOTE — Discharge Instructions (Signed)
There is not appear to be an emergent cause for her symptoms at this time. Please follow-up with your primary care doctor for further evaluation and management of your symptoms. Return to ED for new or worsening symptoms.  General Headache Without Cause A headache is pain or discomfort felt around the head or neck area. There are many causes and types of headaches. In some cases, the cause may not be found.  HOME CARE  Managing Pain  Take over-the-counter and prescription medicines only as told by your doctor.  Lie down in a dark, quiet room when you have a headache.  If directed, apply ice to the head and neck area:  Put ice in a plastic bag.  Place a towel between your skin and the bag.  Leave the ice on for 20 minutes, 2-3 times per day.  Use a heating pad or hot shower to apply heat to the head and neck area as told by your doctor.  Keep lights dim if bright lights bother you or make your headaches worse. Eating and Drinking  Eat meals on a regular schedule.  Lessen how much alcohol you drink.  Lessen how much caffeine you drink, or stop drinking caffeine. General Instructions  Keep all follow-up visits as told by your doctor. This is important.  Keep a journal to find out if certain things bring on headaches. For example, write down:  What you eat and drink.  How much sleep you get.  Any change to your diet or medicines.  Relax by getting a massage or doing other relaxing activities.  Lessen stress.  Sit up straight. Do not tighten (tense) your muscles.  Do not use tobacco products. This includes cigarettes, chewing tobacco, or e-cigarettes. If you need help quitting, ask your doctor.  Exercise regularly as told by your doctor.  Get enough sleep. This often means 7-9 hours of sleep. GET HELP IF:  Your symptoms are not helped by medicine.  You have a headache that feels different than the other headaches.  You feel sick to your stomach (nauseous) or you  throw up (vomit).  You have a fever. GET HELP RIGHT AWAY IF:   Your headache becomes really bad.  You keep throwing up.  You have a stiff neck.  You have trouble seeing.  You have trouble speaking.  You have pain in the eye or ear.  Your muscles are weak or you lose muscle control.  You lose your balance or have trouble walking.  You feel like you will pass out (faint) or you pass out.  You have confusion.   This information is not intended to replace advice given to you by your health care provider. Make sure you discuss any questions you have with your health care provider.   Document Released: 08/20/2008 Document Revised: 08/02/2015 Document Reviewed: 03/06/2015 Elsevier Interactive Patient Education 2016 Elsevier Inc.  Paresthesia Paresthesia is a burning or prickling feeling. This feeling can happen in any part of the body. It often happens in the hands, arms, legs, or feet. Usually, it is not painful. In most cases, the feeling goes away in a short time and is not a sign of a serious problem. HOME CARE  Avoid drinking alcohol.  Try massage or needle therapy (acupuncture) to help with your problems.  Keep all follow-up visits as told by your doctor. This is important. GET HELP IF:  You keep on having episodes of paresthesia.  Your burning or prickling feeling gets worse when you walk.  You have pain or cramps.  You feel dizzy.  You have a rash. GET HELP RIGHT AWAY IF:  You feel weak.  You have trouble walking or moving.  You have problems speaking, understanding, or seeing.  You feel confused.  You cannot control when you pee (urinate) or poop (bowel movement).  You lose feeling (numbness) after an injury.  You pass out (faint).   This information is not intended to replace advice given to you by your health care provider. Make sure you discuss any questions you have with your health care provider.   Document Released: 10/24/2008 Document  Revised: 03/28/2015 Document Reviewed: 11/07/2014 Elsevier Interactive Patient Education [employer].

## 2015-10-10 ENCOUNTER — Ambulatory Visit: Payer: Self-pay | Admitting: Cardiology

## 2015-10-16 ENCOUNTER — Encounter: Payer: Self-pay | Admitting: Cardiology

## 2015-10-16 ENCOUNTER — Ambulatory Visit (INDEPENDENT_AMBULATORY_CARE_PROVIDER_SITE_OTHER): Payer: Medicaid Other | Admitting: Cardiology

## 2015-10-16 VITALS — BP 152/85 | HR 80 | Ht <= 58 in | Wt 139.4 lb

## 2015-10-16 DIAGNOSIS — I10 Essential (primary) hypertension: Secondary | ICD-10-CM | POA: Diagnosis not present

## 2015-10-16 DIAGNOSIS — R0789 Other chest pain: Secondary | ICD-10-CM | POA: Diagnosis not present

## 2015-10-16 NOTE — Progress Notes (Signed)
Cardiology Office Note  Date: 10/16/2015   ID: Victoria Townsend, DOB 05-09-1961, MRN [id number]  PCP: Victoria Curet, MD  Primary Cardiologist: Victoria Lesches, MD   Chief Complaint  Patient presents with  . Follow-up visit    History of Present Illness: Victoria Townsend is a 54 y.o. female last seen in September 2015. She comes to the office today after recent ER visits. Records from [place] [facility] [facility] reviewed, she was admitted for observation of bradycardia, she did not have any evidence of ACS or substantial arrhythmias, [name] was discontinued. Lab work is reviewed below. She has had follow-up with Dr. Cindie Townsend.  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  I went over her medications. Her blood pressure is elevated today.  Stress test from 2013 as noted below. She does not have any clearly defined history of obstructive CAD or other cardiac arrhythmia.  Past Medical History  Diagnosis Date  . GERD (gastroesophageal reflux disease)   . Essential hypertension, benign   . Bladder polyps   . Nephrolithiasis   . Chest pain     Cardiolite 2009, no ischemia, EF 53%  . Anxiety disorder     Historically has seen a psychiatrist  . Asthma   . Shoulder pain     Shoulder has been injected  . Polysubstance abuse     History of cocaine, marijuana  . Agoraphobia   . PTSD (post-traumatic stress disorder)   . Irregular heartbeat     Current Outpatient Prescriptions  Medication Sig Dispense Refill  . acetaminophen (TYLENOL) 500 MG tablet Take 1,000 mg by mouth every 6 (six) hours as needed for moderate pain.     Marland Kitchen albuterol (VENTOLIN HFA) 108 (90 BASE)  MCG/ACT inhaler Inhale 2 puffs into the lungs every 6 (six) hours as needed for shortness of breath.     . ALPRAZolam (XANAX) 1 MG tablet Take 1 mg by mouth 3 (three) times daily.    Marland Kitchen aspirin (ASPIR-LOW) 81 MG EC tablet Take 81 mg by mouth daily as needed (for chest pain).     . clobetasol (TEMOVATE) 0.05 % cream Apply 1 application topically daily as needed (for genitals).     . gabapentin (NEURONTIN) 300 MG capsule Take 300 mg by mouth 2 (two) times daily.    . naproxen (NAPROSYN) 500 MG tablet Take 500 mg by mouth 2 (two) times daily.    . Oxybutynin Chloride (GELNIQUE) 10 % GEL Place onto the skin daily.      Marland Kitchen oxyCODONE (OXY IR/ROXICODONE) 5 MG immediate release tablet Take 5 mg by mouth 2 (two) times daily.    . pantoprazole (PROTONIX) 40 MG tablet Take 40 mg by mouth 2 (two) times daily.      . valACYclovir (VALTREX) 1000 MG tablet Take 500 mg by mouth every other day.      No current facility-administered medications for this visit.    Allergies:  Ciprofloxacin; Codeine; Ibuprofen; Erythromycin; and Shellfish allergy   Social History: The patient  reports that she has been smoking.  She does not have any smokeless tobacco history on file. She reports that she does not drink alcohol or use illicit drugs.  ROS:  Please see the history of present illness. Otherwise, complete review of systems is positive for multiple complaints as outlined above.  All other systems are reviewed and negative.   Physical Exam: VS:  BP 152/85 mmHg  Pulse 80  Ht 4\' 10"  (1.473 m)  Wt 139 lb 6.4 oz (63.231 kg)  BMI 29.14 kg/m2  SpO2 97%, BMI Body mass index is 29.14 kg/(m^2).  Wt Readings from Last 3 Encounters:  10/16/15 139 lb 6.4 oz (63.231 kg)  08/16/14 134 lb 12.8 oz (61.145 kg)  06/07/14 130 lb (58.968 kg)     Chronically ill-appearing woman, no distress.  HEENT: Conjunctiva and lids normal, oropharynx clear with poor dentition.  Neck: Supple, no elevated JVP or carotid bruits, no  thyromegaly.  Lungs: Diminished breath sounds but clear to auscultation, nonlabored breathing at rest.  Cardiac: Regular rate and rhythm, no S3 or significant systolic murmur, no pericardial rub.  Abdomen: Soft, nontender, bowel sounds present.  Extremities: No pitting edema, distal pulses 2+.    ECG: Tracing from 09/26/2015 showed sinus bradycardia with R' in lead V1 and V2.   Recent Labwork:  November 2016: BUN 10, creatinine 0.6, AST 18, ALT 9, potassium 4.0, troponin T negative 3, TSH 1.3, hemoglobin 12.1, platelets 283, UDS positive for cannabinoids  Other Studies Reviewed Today:  Exercise echocardiogram 10/01/2012 [facility]) reported no diagnostic ST segment changes at maximum workload 7 METS, no echocardiographic evidence of ischemia.  Assessment and Plan:  1. Atypical chest pain, not consistent with ischemic etiology based on description. She has multiple other complaints as well area further cardiac testing is not planned at this time. Would recommend basic risk factor modification strategies including smoking cessation, continuing aspirin, and management of hypertension depending on blood pressure trend. She should keep regular follow-up with her primary care provider.  2. Essential hypertension, now off [name] due to bradycardia. Blood pressure is elevated today. Would keep follow-up with primary care provider, follow blood pressure trend. Other medications to consider would be Norvasc or possibly ARB.  3. Complains of intermittent headache, numbness in her hands. Could consider neurological evaluation, will defer to primary care provider.  Current medicines were reviewed with the patient today.   Disposition: As needed.  Signed, Satira Sark, MD, [facility] 10/16/2015 1:26 PM    [facility] [place], [place], [place] [address] Phone: [phone]; Fax: [phone]

## 2015-10-16 NOTE — Patient Instructions (Signed)
Your physician recommends that you continue on your current medications as directed. Please refer to the Current Medication list given to you today. Your physician recommends that you don't need any follow up appointments.

## 2016-04-23 ENCOUNTER — Encounter: Payer: Self-pay | Admitting: Podiatry

## 2016-04-23 ENCOUNTER — Ambulatory Visit (INDEPENDENT_AMBULATORY_CARE_PROVIDER_SITE_OTHER): Payer: Medicaid Other

## 2016-04-23 ENCOUNTER — Ambulatory Visit (INDEPENDENT_AMBULATORY_CARE_PROVIDER_SITE_OTHER): Payer: Medicaid Other | Admitting: Podiatry

## 2016-04-23 VITALS — BP 155/87 | HR 67 | Resp 14

## 2016-04-23 DIAGNOSIS — S92902A Unspecified fracture of left foot, initial encounter for closed fracture: Secondary | ICD-10-CM | POA: Diagnosis not present

## 2016-04-23 DIAGNOSIS — R52 Pain, unspecified: Secondary | ICD-10-CM

## 2016-04-23 DIAGNOSIS — M2580 Other specified joint disorders, unspecified joint: Secondary | ICD-10-CM

## 2016-04-23 MED ORDER — METHYLPREDNISOLONE 4 MG PO TBPK: ORAL_TABLET | ORAL | Status: DC

## 2016-04-23 NOTE — Progress Notes (Addendum)
   Subjective:    Patient ID: Victoria Townsend, female    DOB: [birth year], 55 y.o.   MRN: [id number]  HPI this patient presents to the office stating that she is having swelling problems under her big toe joint on her left foot. She presents the office saying the swelling has been going on for over 6 months and she has been seen by multiple doctors. She was initially seen by her medical doctor who diagnosed arthritis and treated her with Naprosyn. She said it was very ineffective. He then prescribed gabapentin in an effort to relieve her pain, but the swelling has continued to 4 months. She says [facility] was taken yesterday and again was diagnosed with arthritis. She now presents the office today for the same problem. She presents the office for an evaluation and treatment of this persistent swelling in her big toe joint that causes her to walk with a limp due to the pain through her whole foot    Review of Systems  All other systems reviewed and are negative.      Objective:   Physical Exam GENERAL APPEARANCE: Alert, conversant. Appropriately groomed. No acute distress.  VASCULAR: Pedal pulses are  palpable at  [facility] and PT bilateral.  Capillary refill time is immediate to all digits,  Normal temperature gradient.  Digital hair growth is present bilateral  NEUROLOGIC: sensation is normal to 5.07 monofilament at 5/5 sites bilateral.  Light touch is intact bilateral, Muscle strength normal.  MUSCULOSKELETAL: acceptable muscle strength, tone and stability bilateral.  Intrinsic muscluature intact bilateral.  Rectus appearance of foot and digits noted bilateral. Swelling and palpation tibial sesamoid left foot.  The tibial sesamoid moves upon palpatioin.  DERMATOLOGIC: skin color, texture, and turgor are within normal limits.  No preulcerative lesions or ulcers  are seen, no interdigital maceration noted.  No open lesions present.  Digital nails are asymptomatic. No  drainage noted.         Assessment & Plan:  Sesamioditis tibial sesamoid left foot.  IE  Xray left foot.  Xray reveals fracture tibial sesamoid.   Dispersion pad applied.  Medrol dosepak to be ordered.  RTC 2 weeks.  After discussing her pathology of a fractured sesamoid bone, she decided that she did not have a broken bone and proceeded to remove the dispersion padding. She says she did not have a broken bone but had gout.   She said that the Medrol Dosepak, unless it is 20 mg,  would not be effective s ince that was the dosage needed for her shoulder problem. I told the patient that I have found that the [name] had been very helpful for many of my other patients with this problem. In an effort to help relieve the pain in her foot, I offered her a prescription of Naprosyn 500 mg which she refused  My nurse told me later all she wanted was gabapentin since her medical doctor refused to prescribe this medicine.. At this juncture. I am confused as to the purpose of her visit. since she did not listen to my diagnosis nor consider my treatment plan.     Gardiner Barefoot DPM   Gardiner Barefoot DPM

## 2016-05-14 ENCOUNTER — Ambulatory Visit: Payer: Medicaid Other | Admitting: Podiatry

## 2016-05-31 ENCOUNTER — Ambulatory Visit: Payer: Medicaid Other | Admitting: Podiatry

## 2016-06-18 ENCOUNTER — Ambulatory Visit: Payer: Medicaid Other | Admitting: Podiatry

## 2016-07-05 ENCOUNTER — Ambulatory Visit: Payer: Medicaid Other | Admitting: Podiatry

## 2016-08-08 ENCOUNTER — Other Ambulatory Visit (HOSPITAL_COMMUNITY): Payer: Self-pay | Admitting: Family Medicine

## 2016-08-08 DIAGNOSIS — M545 Low back pain: Secondary | ICD-10-CM

## 2016-08-14 ENCOUNTER — Ambulatory Visit (HOSPITAL_COMMUNITY)
Admission: RE | Admit: 2016-08-14 | Discharge: 2016-08-14 | Disposition: A | Discharge status: Home / Self Care | Payer: Medicaid Other | Source: Ambulatory Visit | Attending: Family Medicine | Admitting: Family Medicine

## 2016-08-14 ENCOUNTER — Ambulatory Visit (HOSPITAL_COMMUNITY): Payer: Medicaid Other

## 2016-08-14 DIAGNOSIS — M4186 Other forms of scoliosis, lumbar region: Secondary | ICD-10-CM | POA: Diagnosis not present

## 2016-08-14 DIAGNOSIS — M545 Low back pain: Secondary | ICD-10-CM

## 2016-08-14 DIAGNOSIS — M4806 Spinal stenosis, lumbar region: Secondary | ICD-10-CM | POA: Diagnosis not present

## 2016-08-14 DIAGNOSIS — M5136 Other intervertebral disc degeneration, lumbar region: Secondary | ICD-10-CM | POA: Insufficient documentation

## 2016-10-02 ENCOUNTER — Ambulatory Visit: Payer: Medicaid Other | Admitting: Podiatry

## 2017-01-06 ENCOUNTER — Other Ambulatory Visit (HOSPITAL_COMMUNITY): Payer: Self-pay | Admitting: Family Medicine

## 2017-01-06 ENCOUNTER — Encounter (HOSPITAL_COMMUNITY): Payer: Self-pay | Admitting: Emergency Medicine

## 2017-01-06 ENCOUNTER — Emergency Department (HOSPITAL_COMMUNITY)
Admission: EM | Admit: 2017-01-06 | Discharge: 2017-01-06 | Disposition: A | Discharge status: Home / Self Care | Payer: Medicaid Other | Attending: Emergency Medicine | Admitting: Emergency Medicine

## 2017-01-06 ENCOUNTER — Ambulatory Visit (HOSPITAL_COMMUNITY): Payer: Medicaid Other

## 2017-01-06 ENCOUNTER — Ambulatory Visit (HOSPITAL_COMMUNITY)
Admission: RE | Admit: 2017-01-06 | Discharge: 2017-01-06 | Disposition: A | Discharge status: Home / Self Care | Payer: Medicaid Other | Source: Ambulatory Visit | Attending: Family Medicine | Admitting: Family Medicine

## 2017-01-06 ENCOUNTER — Encounter (HOSPITAL_COMMUNITY): Payer: Self-pay

## 2017-01-06 DIAGNOSIS — J45909 Unspecified asthma, uncomplicated: Secondary | ICD-10-CM | POA: Diagnosis not present

## 2017-01-06 DIAGNOSIS — F17210 Nicotine dependence, cigarettes, uncomplicated: Secondary | ICD-10-CM | POA: Diagnosis not present

## 2017-01-06 DIAGNOSIS — M19042 Primary osteoarthritis, left hand: Secondary | ICD-10-CM

## 2017-01-06 DIAGNOSIS — N898 Other specified noninflammatory disorders of vagina: Secondary | ICD-10-CM

## 2017-01-06 DIAGNOSIS — I708 Atherosclerosis of other arteries: Secondary | ICD-10-CM | POA: Diagnosis not present

## 2017-01-06 DIAGNOSIS — M160 Bilateral primary osteoarthritis of hip: Secondary | ICD-10-CM

## 2017-01-06 DIAGNOSIS — J029 Acute pharyngitis, unspecified: Secondary | ICD-10-CM | POA: Diagnosis not present

## 2017-01-06 DIAGNOSIS — Z7982 Long term (current) use of aspirin: Secondary | ICD-10-CM | POA: Diagnosis not present

## 2017-01-06 DIAGNOSIS — Z79899 Other long term (current) drug therapy: Secondary | ICD-10-CM | POA: Insufficient documentation

## 2017-01-06 DIAGNOSIS — I10 Essential (primary) hypertension: Secondary | ICD-10-CM | POA: Insufficient documentation

## 2017-01-06 DIAGNOSIS — M19041 Primary osteoarthritis, right hand: Secondary | ICD-10-CM

## 2017-01-06 HISTORY — DX: Herpesviral infection, unspecified: B00.9

## 2017-01-06 MED ORDER — ACETAMINOPHEN 500 MG PO TABS
1000.0000 mg | ORAL_TABLET | Freq: Once | ORAL | Status: AC
  Administered 2017-01-06: 1000 mg via ORAL
  Filled 2017-01-06: qty 2

## 2017-01-06 MED ORDER — AMOXICILLIN 250 MG PO CAPS
500.0000 mg | ORAL_CAPSULE | Freq: Once | ORAL | Status: AC
  Administered 2017-01-06: 500 mg via ORAL
  Filled 2017-01-06: qty 2

## 2017-01-06 MED ORDER — AMOXICILLIN 500 MG PO CAPS: 500.0000 mg | ORAL_CAPSULE | Freq: Three times a day (TID) | ORAL | 0 refills | Status: DC

## 2017-01-06 NOTE — ED Notes (Signed)
Pelvic exam performed with PA in room.

## 2017-01-06 NOTE — ED Provider Notes (Signed)
[facility] DEPT Provider Note   CSN: [id number] Arrival date & time: 01/06/17  1132  By signing my name below, I, Collene Leyden, attest that this documentation has been prepared under the direction and in the presence of Lily Kocher PA-C Electronically Signed: Collene Leyden, Scribe. 01/06/17. 2:29 PM.  History   Chief Complaint Chief Complaint  Patient presents with  . Rash  . SEXUALLY TRANSMITTED DISEASE    HPI Comments: Victoria Townsend is a 56 y.o. female with a hx of herpes and polysubstance abuse, who presents to the Emergency Department complaining of a sudden-onset, rash to the anterior neck that appeared a week and a half ago. 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 Patient also reports a herpes flare up that began 3 months ago. Patient has associated vaginal burning, vaginal stinging, epigastric pain, sore throat, headache, subjective fever, and fatigue. Patient reports having unprotected sex regularly. Patient states she has an appointment with her OBGYN in one week. Patient reports trying to look at her vagina, but could not see anything. Patient requesting a hepatitis C test. Patient denies any genital wart hx, abnormal pap smear, dysuria, hematuria, nausea, vomiting, diarrhea, or any other symptoms.    The history is provided by the patient. No language interpreter was used.    Past Medical History:  Diagnosis Date  . Agoraphobia   . Anxiety disorder    Historically has seen a psychiatrist  . Asthma   . Bladder polyps   . Chest pain    Cardiolite 2009, no ischemia, EF 53%  . Essential hypertension, benign   . GERD (gastroesophageal reflux disease)   . Herpes   . Irregular heartbeat   . Nephrolithiasis   . Polysubstance abuse    History of cocaine, marijuana  . PTSD (post-traumatic stress disorder)   . Shoulder pain    Shoulder has been injected    Patient Active Problem List   Diagnosis Date  Noted  . Sesamoiditis 04/23/2016  . Abdominal pain, other specified site 08/04/2013  . Abdominal pain, epigastric 05/26/2013  . GERD (gastroesophageal reflux disease)   . Atypical chest pain   . Anxiety disorder   . Tobacco abuse   . Asthma   . Polysubstance abuse   . Essential hypertension, benign     Past Surgical History:  Procedure Laterality Date  . APPENDECTOMY    . Bladder polyps    . CESAREAN SECTION WITH BILATERAL TUBAL LIGATION    . CHOLECYSTECTOMY    . COLONOSCOPY  May 2012   Dr. Posey Pronto: scattered diverticula pancolonic  . ESOPHAGOGASTRODUODENOSCOPY  April 2012   Dr. Posey Pronto: gastritis  . ESOPHAGOGASTRODUODENOSCOPY N/A 08/09/2013   [id number] polyps/abnormal gastric because of uncertain significance-status post biopsy. Small hiatal PrismBlog.es chronic inflammation on bx. hyperplastic polyp  . URETHRAL STRICTURE DILATATION      OB History    No data available       Home Medications    Prior to Admission medications   Medication Sig Start Date End Date Taking? Authorizing Provider  acetaminophen (TYLENOL) 500 MG tablet Take 1,000 mg by mouth every 6 (six) hours as needed for moderate pain.     Historical Provider, MD  albuterol (VENTOLIN HFA) 108 (90 BASE) MCG/ACT inhaler Inhale 2 puffs into the lungs every 6 (six) hours as needed for shortness of breath.     Historical Provider, MD  ALPRAZolam Duanne Moron) 1 MG tablet Take 1 mg  by mouth 3 (three) times daily.    Historical Provider, MD  aspirin (ASPIR-LOW) 81 MG EC tablet Take 81 mg by mouth daily as needed (for chest pain).     Historical Provider, MD  clobetasol (TEMOVATE) 0.05 % cream Apply 1 application topically daily as needed (for genitals).     Historical Provider, MD  gabapentin (NEURONTIN) 300 MG capsule Take 300 mg by mouth 2 (two) times daily.    Historical Provider, MD  methylPREDNISolone (MEDROL DOSEPAK) 4 MG TBPK tablet As directed 04/23/16   Gardiner Barefoot, DPM  naproxen (NAPROSYN) 500 MG tablet Take 500  mg by mouth 2 (two) times daily.    Historical Provider, MD  Oxybutynin Chloride (GELNIQUE) 10 % GEL Place onto the skin daily.      Historical Provider, MD  oxyCODONE (OXY IR/ROXICODONE) 5 MG immediate release tablet Take 5 mg by mouth 2 (two) times daily.    Historical Provider, MD  pantoprazole (PROTONIX) 40 MG tablet Take 40 mg by mouth 2 (two) times daily.      Historical Provider, MD  valACYclovir (VALTREX) 1000 MG tablet Take 500 mg by mouth every other day.     Historical Provider, MD    Family History Family History  Problem Relation Age of Onset  . Coronary artery disease Mother     CABG  . Colon cancer Paternal Uncle     Social History Social History  Substance Use Topics  . Smoking status: Current Every Day Smoker    Packs/day: 1.00    Types: Cigarettes  . Smokeless tobacco: Never Used     Comment: 2-3 ciggs per day  . Alcohol use No     Comment: Former user     Allergies   Ciprofloxacin; Codeine; Ibuprofen; Erythromycin; and Shellfish allergy   Review of Systems Review of Systems  Constitutional: Positive for fatigue and fever (subjective).  Gastrointestinal: Positive for abdominal pain (epigastric region). Negative for diarrhea, nausea and vomiting.  Genitourinary: Positive for vaginal pain. Negative for dysuria and hematuria.  Neurological: Positive for headaches.  All other systems reviewed and are negative.    Physical Exam Updated Vital Signs BP 150/94   Pulse 79   Temp 98 F (36.7 C) (Oral)   Resp 17   Ht 4\' 10"  (1.473 m)   Wt 143 lb (64.9 kg)   SpO2 96%   BMI 29.89 kg/m   Physical Exam  Constitutional: She is oriented to person, place, and time. She appears well-developed.  HENT:  Head: Normocephalic and atraumatic.  Mouth/Throat: Oropharynx is clear and moist.  uvula mild to moderately swollen with increased redness.   Eyes: Conjunctivae and EOM are normal. Pupils are equal, round, and reactive to light.  Neck: Normal range of motion.  Neck supple.  There is a fine red rash on the right and the left anteriorly. No drainage or red streaking.   Cardiovascular: Normal rate.   Pulmonary/Chest: Effort normal.  Abdominal: Soft. Bowel sounds are normal.  Genitourinary:  Genitourinary Comments: Chaperone present during the examination. There is no rash or ulcers of the external labia or vaginal area. There are no lesions about the clitoral hood. There are no inguinal nodes palpable. No cervical wall motion tenderness. No adnexal tenderness. Minimal discharge in vaginal vault.   Musculoskeletal: Normal range of motion.  Neurological: She is alert and oriented to person, place, and time.  Skin: Skin is warm and dry.  Psychiatric: She has a normal mood and affect.  ED Treatments / Results  DIAGNOSTIC STUDIES: Oxygen Saturation is 96% on RA, adequate by my interpretation.    COORDINATION OF CARE: 2:28 PM Discussed treatment plan with pt at bedside and pt agreed to plan.  Labs (all labs ordered are listed, but only abnormal results are displayed) Labs Reviewed - No data to display  EKG  EKG Interpretation None       Radiology Dg Hips Bilat With Pelvis Min 5 Views  Result Date: 01/06/2017 CLINICAL DATA:  Hip pain. EXAM: DG HIP (WITH OR WITHOUT PELVIS) 5+V BILAT COMPARISON:  None. FINDINGS: Frontal pelvis as well as standing frontal and supine lateral views of each hip -total five views -obtained. No fracture or dislocation. Joint spaces appear normal. No erosive change. There are foci of arterial vascular calcification in the left common femoral and superficial femoral arteries. There are phleboliths in the pelvis. IMPRESSION: No fracture or dislocation. No appreciable arthropathy. Areas of arterial vascular atherosclerosis. Electronically Signed   By: Lowella Grip III M.D.   On: 01/06/2017 13:23    Procedures Procedures (including critical care time)  Medications Ordered in ED Medications - No data to  display   Initial Impression / Assessment and Plan / ED Course  I have reviewed the triage vital signs and the nursing notes.  Pertinent labs & imaging results that were available during my care of the patient were reviewed by me and considered in my medical decision making (see chart for details).     *I have reviewed nursing notes, vital signs, and all appropriate lab and imaging results for this patient.**  Final Clinical Impressions(s) / ED Diagnoses   MDM: Patient complains of rash about her neck. Patient complains of vaginal irritation, and sore throat. The examination suggest possible contact dermatitis involving the neck. The patient states the only contact has been with sheets. She has changed her fabric softener recently. No unusual rash noted about the vaginal area. Cultures have been sent to the lab. Patient has also tested at her request for syphilis and HIV. The patient has some mild increased redness of her uvula with some mild swelling. Patient will be treated with Amoxil, Tylenol, saltwater gargles, Chloraseptic. Patient is in agreement with this plan. Final diagnoses:  Vaginal irritation  Pharyngitis, unspecified etiology    New Prescriptions New Prescriptions   No medications on file   **I personally performed the services described in this documentation, which was scribed in my presence. The recorded information has been reviewed and is accurate.Lily Kocher, PA-C 01/06/17 1551    Elnora Morrison, MD 01/06/17 [phone]

## 2017-01-06 NOTE — ED Notes (Signed)
Pt called and reported needed to update contact information to receive xray results. Registration notified and pt contact number changed to [phone].

## 2017-01-06 NOTE — Discharge Instructions (Signed)
Please use salt water gargles and Chloraseptic Spray for your throat. Use Amoxil 3 times daily with food. Please see Dr. Lorriane Shire for additional evaluation if not improving. Someone from our flow managers office will call you if there are any abnormalities of your test today.

## 2017-01-06 NOTE — ED Triage Notes (Signed)
Pt states she has a rash on her neck and found out she was exposed to syphilis 7 months ago.  States her herpes is also burning.

## 2017-01-07 LAB — HIV ANTIBODY (ROUTINE TESTING W REFLEX): HIV SCREEN 4TH GENERATION: NONREACTIVE

## 2017-01-07 LAB — GC/CHLAMYDIA PROBE AMP (~~LOC~~) NOT AT ARMC
CHLAMYDIA, DNA PROBE: NEGATIVE
NEISSERIA GONORRHEA: NEGATIVE

## 2017-01-07 LAB — RPR: RPR: NONREACTIVE

## 2017-10-01 ENCOUNTER — Ambulatory Visit: Payer: Self-pay | Admitting: Nurse Practitioner

## 2017-10-08 ENCOUNTER — Ambulatory Visit: Payer: Self-pay | Admitting: Podiatry

## 2017-11-12 ENCOUNTER — Ambulatory Visit: Payer: Medicaid Other | Admitting: Gastroenterology

## 2017-11-12 ENCOUNTER — Encounter: Payer: Self-pay | Admitting: Gastroenterology

## 2017-11-12 ENCOUNTER — Encounter: Payer: Self-pay | Admitting: *Deleted

## 2017-11-12 VITALS — BP 143/93 | HR 72 | Temp 97.3°F | Ht <= 58 in | Wt 138.8 lb

## 2017-11-12 DIAGNOSIS — K219 Gastro-esophageal reflux disease without esophagitis: Secondary | ICD-10-CM

## 2017-11-12 DIAGNOSIS — F1011 Alcohol abuse, in remission: Secondary | ICD-10-CM

## 2017-11-12 DIAGNOSIS — K625 Hemorrhage of anus and rectum: Secondary | ICD-10-CM | POA: Diagnosis not present

## 2017-11-12 DIAGNOSIS — K760 Fatty (change of) liver, not elsewhere classified: Secondary | ICD-10-CM

## 2017-11-12 DIAGNOSIS — Z87898 Personal history of other specified conditions: Secondary | ICD-10-CM | POA: Diagnosis not present

## 2017-11-12 DIAGNOSIS — R1010 Upper abdominal pain, unspecified: Secondary | ICD-10-CM

## 2017-11-12 DIAGNOSIS — R1011 Right upper quadrant pain: Secondary | ICD-10-CM | POA: Insufficient documentation

## 2017-11-12 NOTE — Patient Instructions (Signed)
1. Please have your labs and CT scan done. We will contact you with results as available. 2. After CT findings, we will schedule you for colonoscopy.

## 2017-11-12 NOTE — Progress Notes (Signed)
Primary Care Physician:  Lucia Gaskins, MD  Primary Gastroenterologist:  Garfield Cornea, MD   Chief Complaint  Patient presents with  . Abdominal Pain    "wants liver and intestines checked"    HPI:  Victoria Townsend is a 56 y.o. female here for further evaluation of abdominal pain. Last seen in 2015. She has h/o chronic abd pain with extensive evaluation in the past. EGD/TCS 2012 by Dr. Posey Pronto showed gastritis, pancolonic diverticulosis. EGD by Dr. Gala Romney 2014 showed gastric polyps(hyperplastic)/abnormal gastric mucosis with chronic mild inflammation on bx, small. CT A/P at that time showed bilateral nonobstuction kidney stones, low-lying bladder.   Patient reports upper abd pain, LUQ fullness worse the last year. She is worried about her prior heavy etoh abuse.  She reports drinking 2/5 of liquor per day or 4 bottles of wine per day.  No heartburn, vomiting, dysphagia. Intermittent abdominal pain. Pain unrelated to meals. BM most days. No melena. Occasional fresh blood with straining, ?hemorrhoid.   She has her chronic pain managed by Dr. Cindie Laroche. Her psychiatrist is Dr. Wonda Amis.   Pain in upper abdomen. luq fullness. Heavy etoh use in remote past. Bad over last one year.    Current Outpatient Medications  Medication Sig Dispense Refill  . acetaminophen (TYLENOL) 500 MG tablet Take 1,000 mg by mouth every 6 (six) hours as needed for moderate pain.     Marland Kitchen albuterol (VENTOLIN HFA) 108 (90 BASE) MCG/ACT inhaler Inhale 2 puffs into the lungs every 6 (six) hours as needed for shortness of breath.     . ALPRAZolam (XANAX) 1 MG tablet Take 1 mg by mouth 4 (four) times daily.     Marland Kitchen aspirin (ASPIR-LOW) 81 MG EC tablet Take 81 mg by mouth daily as needed (for chest pain).     . clobetasol (TEMOVATE) 0.05 % cream Apply 1 application topically daily as needed (for genitals).     . gabapentin (NEURONTIN) 300 MG capsule Take 300 mg by mouth 2 (two) times daily.    Marland Kitchen HYDROcodone-acetaminophen  (NORCO/VICODIN) 5-325 MG tablet Take 1 tablet by mouth 4 (four) times daily.  0  . oxybutynin (DITROPAN) 5 MG tablet Take 5 mg by mouth daily.  3  . pantoprazole (PROTONIX) 40 MG tablet Take 40 mg by mouth 2 (two) times daily.      . valACYclovir (VALTREX) 1000 MG tablet Take 500 mg by mouth every other day.      No current facility-administered medications for this visit.     Allergies as of 11/12/2017 - Review Complete 11/12/2017  Allergen Reaction Noted  . Ciprofloxacin Swelling and Other (See Comments) 04/12/2013  . Codeine Nausea And Vomiting   . Ibuprofen Other (See Comments)   . Erythromycin Other (See Comments) 08/16/2014  . Shellfish allergy Rash 04/12/2013    Past Medical History:  Diagnosis Date  . Agoraphobia   . Anxiety disorder    Historically has seen a psychiatrist  . Asthma   . Bladder polyps   . Chest pain    Cardiolite 2009, no ischemia, EF 53%  . Essential hypertension, benign   . GERD (gastroesophageal reflux disease)   . Herpes   . Irregular heartbeat   . Nephrolithiasis   . Polysubstance abuse ([place])    History of cocaine, marijuana  . PTSD (post-traumatic stress disorder)   . Shoulder pain    Shoulder has been injected    Past Surgical History:  Procedure Laterality Date  . APPENDECTOMY    . Bladder  polyps    . CESAREAN SECTION WITH BILATERAL TUBAL LIGATION    . CHOLECYSTECTOMY    . COLONOSCOPY  May 2012   Dr. Posey Pronto: scattered diverticula pancolonic  . ESOPHAGOGASTRODUODENOSCOPY  April 2012   Dr. Posey Pronto: gastritis  . ESOPHAGOGASTRODUODENOSCOPY N/A 08/09/2013   TDD:UKGURKY polyps/abnormal gastric because of uncertain significance-status post biopsy. Small hiatal PrismBlog.es chronic inflammation on bx. hyperplastic polyp  . URETHRAL STRICTURE DILATATION    . WRIST FRACTURE SURGERY Right     Family History  Problem Relation Age of Onset  . Coronary artery disease Mother        CABG  . Colon cancer Paternal Uncle 53  . Stomach cancer  Paternal Grandmother 60  . Stomach cancer Maternal Aunt 60  . Breast cancer Paternal Aunt   . Ovarian cancer Maternal Aunt     Social History   Socioeconomic History  . Marital status: Single    Spouse name: Not on file  . Number of children: Not on file  . Years of education: Not on file  . Highest education level: Not on file  Social Needs  . Financial resource strain: Not on file  . Food insecurity - worry: Not on file  . Food insecurity - inability: Not on file  . Transportation needs - medical: Not on file  . Transportation needs - non-medical: Not on file  Occupational History  . Not on file  Tobacco Use  . Smoking status: Current Every Day Smoker    Packs/day: 1.00    Types: Cigarettes  . Smokeless tobacco: Never Used  . Tobacco comment: 2-3 ciggs per day  Substance and Sexual Activity  . Alcohol use: No    Alcohol/week: 0.0 oz    Comment: Former user, heavy etoh use prior to 1990s.  . Drug use: Yes    Types: Marijuana, Cocaine    Comment: 11/12/17-denied cocaine, smokes weed  . Sexual activity: Not on file  Other Topics Concern  . Not on file  Social History Narrative   ** Merged History Encounter **          ROS:  General: Negative for anorexia, weight loss, fever, chills, fatigue, weakness. Eyes: Negative for vision changes.  ENT: Negative for hoarseness, difficulty swallowing , nasal congestion. CV: Negative for chest pain, angina, palpitations, dyspnea on exertion, peripheral edema.  Respiratory: Negative for dyspnea at rest, dyspnea on exertion, cough, sputum, wheezing.  GI: See history of present illness. GU:  Negative for dysuria, hematuria, urinary incontinence, urinary frequency, nocturnal urination.  MS: Negative for joint pain, low back pain.  Derm: Negative for rash or itching.  Neuro: Negative for weakness, abnormal sensation, seizure, frequent headaches, memory loss, confusion.  Psych: Negative for anxiety, depression, suicidal ideation,  hallucinations.  Endo: Negative for unusual weight change.  Heme: Negative for bruising or bleeding. Allergy: Negative for rash or hives.    Physical Examination:  BP (!) 143/93   Pulse 72   Temp (!) 97.3 F (36.3 C) (Oral)   Ht 4\' 10"  (1.473 m)   Wt 138 lb 12.8 oz (63 kg)   BMI 29.01 kg/m    General: Well-nourished, well-developed in no acute distress.  Head: Normocephalic, atraumatic.   Eyes: Conjunctiva pink, no icterus. Mouth: Oropharyngeal mucosa moist and pink , no lesions erythema or exudate. Neck: Supple without thyromegaly, masses, or lymphadenopathy.  Lungs: Clear to auscultation bilaterally.  Heart: Regular rate and rhythm, no murmurs rubs or gallops.  Abdomen: Bowel sounds are normal, moderate epig/luq tenderness,  nondistended, no hepatosplenomegaly or masses, no abdominal bruits or    hernia , no rebound or guarding.   Rectal: not performed Extremities: No lower extremity edema. No clubbing or deformities.  Neuro: Alert and oriented x 4 , grossly normal neurologically.  Skin: Warm and dry, no rash or jaundice.   Psych: Alert and cooperative, normal mood and affect.  Imaging Studies: No results found.

## 2017-11-15 NOTE — Assessment & Plan Note (Signed)
Update colonoscopy in near future with deep sedation. Await CT findings first.  I have discussed the risks, alternatives, benefits with regards to but not limited to the risk of reaction to medication, bleeding, infection, perforation and the patient is agreeable to proceed. Written consent to be obtained.

## 2017-11-15 NOTE — Assessment & Plan Note (Signed)
One year h/o worsening upper abdominal pain/luq fullness. Typical reflux well controlled.  Worse postprandially.  Prior extensive alcohol abuse.  Polysubstance abuse as well.  Previous extensive evaluation of upper abdominal pain it is been several years point.  Plan for CT abdomen pelvis in the near future.  If unremarkable she may require upper endoscopy at time of colonoscopy.  Further recommendations to follow.

## 2017-11-17 ENCOUNTER — Telehealth: Payer: Self-pay | Admitting: *Deleted

## 2017-11-17 DIAGNOSIS — R1010 Upper abdominal pain, unspecified: Secondary | ICD-10-CM

## 2017-11-17 DIAGNOSIS — K760 Fatty (change of) liver, not elsewhere classified: Secondary | ICD-10-CM

## 2017-11-17 NOTE — Telephone Encounter (Signed)
PA submitted for CT ABD/PELVIS W/ CONTRAST via [employer] website. Pending clinical review. clinicals faxed. #[id number]

## 2017-11-17 NOTE — Progress Notes (Signed)
CC'D TO PCP °

## 2017-11-20 NOTE — Telephone Encounter (Signed)
CT ABD W/ contrast was approved via [employer] website. [id number]. 11/20/17-12/20/17.

## 2017-11-20 NOTE — Telephone Encounter (Signed)
Plan for CT Abd with contrast per Medicaid approval notation.

## 2017-11-20 NOTE — Addendum Note (Signed)
Addended by: Inge Rise on: 11/20/2017 01:58 PM   Modules accepted: Orders

## 2017-11-20 NOTE — Telephone Encounter (Signed)
Per [employer] website for PA CT ABD/PELVIS w/ contrast was denied.  "Medicaid did not approve this request because: Based on [employer] Abdomen Imaging Guidelines, we are unable to approve the requested procedure. A CT of the Abdomen with contrast only is supported for the clinical indication(s) presented. The clinical information provided does not describe signs or symptoms related to the pelvis. Abdominal CT is usually performed only with contrast. Exceptions include abdominal CT both without and with contrast if solid organ lesions are involved, for example, in the liver, pancreas, or spleen. The clinical information provided does not meet these criteria and, therefore, CT of the abdomen only with contrast (procedure code [phone]) can be approved as an alternative."

## 2017-11-20 NOTE — Telephone Encounter (Signed)
PA initiated for CT ABD w/ contrast. This went in clinical review [phone]. Clinical notes faxed. Called made pt aware medicaid denied CT abd/pelvis w/ contrast but we are working on approval just for CT abdomen w/ contrast.

## 2017-12-10 ENCOUNTER — Ambulatory Visit (HOSPITAL_COMMUNITY)
Admission: RE | Admit: 2017-12-10 | Discharge: 2017-12-10 | Disposition: A | Discharge status: Home / Self Care | Payer: Medicaid Other | Source: Ambulatory Visit | Attending: Gastroenterology | Admitting: Gastroenterology

## 2017-12-10 ENCOUNTER — Ambulatory Visit (HOSPITAL_COMMUNITY): Payer: Medicaid Other

## 2017-12-10 DIAGNOSIS — N200 Calculus of kidney: Secondary | ICD-10-CM | POA: Diagnosis not present

## 2017-12-10 DIAGNOSIS — K760 Fatty (change of) liver, not elsewhere classified: Secondary | ICD-10-CM | POA: Diagnosis not present

## 2017-12-10 DIAGNOSIS — K5730 Diverticulosis of large intestine without perforation or abscess without bleeding: Secondary | ICD-10-CM | POA: Diagnosis not present

## 2017-12-10 DIAGNOSIS — R1010 Upper abdominal pain, unspecified: Secondary | ICD-10-CM

## 2017-12-10 MED ORDER — IOPAMIDOL (ISOVUE-300) INJECTION 61%
100.0000 mL | Freq: Once | INTRAVENOUS | Status: AC | PRN
  Administered 2017-12-10: 100 mL via INTRAVENOUS

## 2017-12-22 NOTE — Progress Notes (Signed)
Please let patient know her CT is unremarkable. Tiny nonobstructing right renal stone. No liver abnormalities seen. Pancrease normal on CT. Since there has been significant delay in obtaining CT since her 12/19 appt, I would recommend her come back in to reassess her symptoms prior to scheduling probable TCS/EGD. May use urgent.

## 2017-12-23 ENCOUNTER — Encounter: Payer: Self-pay | Admitting: Internal Medicine

## 2017-12-23 NOTE — Progress Notes (Signed)
PATIENT SCHEDULED  °

## 2018-01-08 ENCOUNTER — Ambulatory Visit: Payer: Medicaid Other | Admitting: Gastroenterology

## 2018-01-21 ENCOUNTER — Encounter: Payer: Self-pay | Admitting: *Deleted

## 2018-01-21 ENCOUNTER — Other Ambulatory Visit: Payer: Self-pay | Admitting: *Deleted

## 2018-01-21 ENCOUNTER — Encounter: Payer: Self-pay | Admitting: Gastroenterology

## 2018-01-21 ENCOUNTER — Ambulatory Visit: Payer: Medicaid Other | Admitting: Gastroenterology

## 2018-01-21 VITALS — BP 139/82 | HR 67 | Temp 97.2°F | Ht <= 58 in | Wt 141.0 lb

## 2018-01-21 DIAGNOSIS — R1310 Dysphagia, unspecified: Secondary | ICD-10-CM

## 2018-01-21 DIAGNOSIS — R1013 Epigastric pain: Secondary | ICD-10-CM

## 2018-01-21 DIAGNOSIS — K219 Gastro-esophageal reflux disease without esophagitis: Secondary | ICD-10-CM

## 2018-01-21 DIAGNOSIS — K625 Hemorrhage of anus and rectum: Secondary | ICD-10-CM | POA: Diagnosis not present

## 2018-01-21 DIAGNOSIS — R1319 Other dysphagia: Secondary | ICD-10-CM | POA: Insufficient documentation

## 2018-01-21 MED ORDER — CLENPIQ 10-3.5-12 MG-GM -GM/160ML PO SOLN: 1.0000 | Freq: Once | ORAL | 0 refills | Status: AC

## 2018-01-21 NOTE — Progress Notes (Signed)
Primary Care Physician: Lucia Gaskins, MD  Primary Gastroenterologist:  Garfield Cornea, MD   Chief Complaint  Patient presents with  . Abdominal Pain    HPI: Victoria Townsend is a 57 y.o. female here for follow-up. She was seen back in December for abdominal pain. Complaining of left upper quadrant fullness for one year at that time. She was worried about prior heavy alcohol abuse. Pain unrelated to meals. Occasional fresh blood was straining which she felt was hemorrhoidal related.  She had a CT abdomen with contrast on January 16. Prior cholecystectomy. Liver unremarkable. Spleen normal in size. Pancreas unremarkable. Visualized part of the colon should diverticulosis but no diverticulitis. CT pelvis was not approved by her insurance therefore the reason for not performing it. She did not complete blood work that we requested.  Patient presents today complaining of strangling off of own saliva. Sometimes hard to swallowing.complains of solid food and pill dysphagia. Postprandial upper abd pain but sometimes without meals.  She is on prednisone intermittently for her lungs/joints/back. Has been having some heartburn symptoms on pantoprazole bid. Going to see heart doctor later this month just for a check up.  Patient also complains of rectal bleeding off/on. Gives h/o colon polyps. No melena, constipation, diarrhea.    Current Outpatient Medications  Medication Sig Dispense Refill  . acetaminophen (TYLENOL) 500 MG tablet Take 1,000 mg by mouth every 6 (six) hours as needed for moderate pain.     Marland Kitchen albuterol (VENTOLIN HFA) 108 (90 BASE) MCG/ACT inhaler Inhale 2 puffs into the lungs every 6 (six) hours as needed for shortness of breath.     . ALPRAZolam (XANAX) 1 MG tablet Take 1 mg by mouth 4 (four) times daily.     Marland Kitchen aspirin (ASPIR-LOW) 81 MG EC tablet Take 81 mg by mouth daily as needed (for chest pain).     . clobetasol (TEMOVATE) 0.05 % cream Apply 1 application topically daily  as needed (for genitals).     . gabapentin (NEURONTIN) 300 MG capsule Take 300 mg by mouth 2 (two) times daily.    Marland Kitchen HYDROcodone-acetaminophen (NORCO/VICODIN) 5-325 MG tablet Take 1 tablet by mouth 4 (four) times daily.  0  . oxybutynin (DITROPAN) 5 MG tablet Take 2.5 mg by mouth daily.   3  . pantoprazole (PROTONIX) 40 MG tablet Take 40 mg by mouth 2 (two) times daily.      . valACYclovir (VALTREX) 1000 MG tablet Take 500 mg by mouth every other day.      No current facility-administered medications for this visit.     Allergies as of 01/21/2018 - Review Complete 01/21/2018  Allergen Reaction Noted  . Ciprofloxacin Swelling and Other (See Comments) 04/12/2013  . Codeine Nausea And Vomiting   . Ibuprofen Other (See Comments)   . Erythromycin Other (See Comments) 08/16/2014  . Shellfish allergy Rash 04/12/2013   Past Medical History:  Diagnosis Date  . Agoraphobia   . Anxiety disorder    Historically has seen a psychiatrist  . Asthma   . Bladder polyps   . Chest pain    Cardiolite 2009, no ischemia, EF 53%  . Essential hypertension, benign   . GERD (gastroesophageal reflux disease)   . Herpes   . Irregular heartbeat   . Nephrolithiasis   . Polysubstance abuse ([place])    History of cocaine, marijuana  . PTSD (post-traumatic stress disorder)   . Shoulder pain    Shoulder has been injected   Past  Surgical History:  Procedure Laterality Date  . APPENDECTOMY    . Bladder polyps    . CESAREAN SECTION WITH BILATERAL TUBAL LIGATION    . CHOLECYSTECTOMY    . COLONOSCOPY  May 2012   Dr. Posey Pronto: scattered diverticula pancolonic  . ESOPHAGOGASTRODUODENOSCOPY  April 2012   Dr. Posey Pronto: gastritis  . ESOPHAGOGASTRODUODENOSCOPY N/A 08/09/2013   [id number] polyps/abnormal gastric because of uncertain significance-status post biopsy. Small hiatal PrismBlog.es chronic inflammation on bx. hyperplastic polyp  . URETHRAL STRICTURE DILATATION    . WRIST FRACTURE SURGERY Right    Family History   Problem Relation Age of Onset  . Coronary artery disease Mother        CABG  . Colon cancer Paternal Uncle 41  . Stomach cancer Paternal Grandmother 60  . Stomach cancer Maternal Aunt 60  . Breast cancer Paternal Aunt   . Ovarian cancer Maternal Aunt    Social History   Tobacco Use  . Smoking status: Current Every Day Smoker    Packs/day: 1.00    Types: Cigarettes  . Smokeless tobacco: Never Used  . Tobacco comment: 2-3 ciggs per day  Substance Use Topics  . Alcohol use: No    Alcohol/week: 0.0 oz    Comment: Former user, heavy etoh use prior to 1990s.  . Drug use: Yes    Types: Marijuana, Cocaine    Comment: 01/21/18-denied cocaine, smokes weed     ROS:  General: Negative for anorexia, weight loss, fever, chills, fatigue, weakness. ENT: Negative for hoarseness, nasal congestion.see hpi CV: Negative for chest pain, angina, palpitations, dyspnea on exertion, peripheral edema.  Respiratory: Negative for dyspnea at rest, dyspnea on exertion, cough, sputum, wheezing.  GI: See history of present illness. GU:  Negative for dysuria, hematuria, urinary incontinence, urinary frequency, nocturnal urination.  Endo: Negative for unusual weight change.    Physical Examination:   BP 139/82   Pulse 67   Temp (!) 97.2 F (36.2 C) (Oral)   Ht 4\' 10"  (1.473 m)   Wt 141 lb (64 kg)   BMI 29.47 kg/m   General: Well-nourished, well-developed in no acute distress.  Eyes: No icterus. Mouth: Oropharyngeal mucosa moist and pink , no lesions erythema or exudate. Lungs: Clear to auscultation bilaterally.  Heart: Regular rate and rhythm, no murmurs rubs or gallops.  Abdomen: Bowel sounds are normal, moderate epig tenderness, nondistended, no hepatosplenomegaly or masses, no abdominal bruits or hernia , no rebound or guarding.   Extremities: No lower extremity edema. No clubbing or deformities. Neuro: Alert and oriented x 4   Skin: Warm and dry, no jaundice.   Psych: Alert and  cooperative, normal mood and affect.

## 2018-01-21 NOTE — H&P (View-Only) (Signed)
Primary Care Physician: Lucia Gaskins, MD  Primary Gastroenterologist:  Garfield Cornea, MD   Chief Complaint  Patient presents with  . Abdominal Pain    HPI: Victoria Townsend is a 57 y.o. female here for follow-up. She was seen back in December for abdominal pain. Complaining of left upper quadrant fullness for one year at that time. She was worried about prior heavy alcohol abuse. Pain unrelated to meals. Occasional fresh blood was straining which she felt was hemorrhoidal related.  She had a CT abdomen with contrast on January 16. Prior cholecystectomy. Liver unremarkable. Spleen normal in size. Pancreas unremarkable. Visualized part of the colon should diverticulosis but no diverticulitis. CT pelvis was not approved by her insurance therefore the reason for not performing it. She did not complete blood work that we requested.  Patient presents today complaining of strangling off of own saliva. Sometimes hard to swallowing.complains of solid food and pill dysphagia. Postprandial upper abd pain but sometimes without meals.  She is on prednisone intermittently for her lungs/joints/back. Has been having some heartburn symptoms on pantoprazole bid. Going to see heart doctor later this month just for a check up.  Patient also complains of rectal bleeding off/on. Gives h/o colon polyps. No melena, constipation, diarrhea.    Current Outpatient Medications  Medication Sig Dispense Refill  . acetaminophen (TYLENOL) 500 MG tablet Take 1,000 mg by mouth every 6 (six) hours as needed for moderate pain.     Marland Kitchen albuterol (VENTOLIN HFA) 108 (90 BASE) MCG/ACT inhaler Inhale 2 puffs into the lungs every 6 (six) hours as needed for shortness of breath.     . ALPRAZolam (XANAX) 1 MG tablet Take 1 mg by mouth 4 (four) times daily.     Marland Kitchen aspirin (ASPIR-LOW) 81 MG EC tablet Take 81 mg by mouth daily as needed (for chest pain).     . clobetasol (TEMOVATE) 0.05 % cream Apply 1 application topically daily  as needed (for genitals).     . gabapentin (NEURONTIN) 300 MG capsule Take 300 mg by mouth 2 (two) times daily.    Marland Kitchen HYDROcodone-acetaminophen (NORCO/VICODIN) 5-325 MG tablet Take 1 tablet by mouth 4 (four) times daily.  0  . oxybutynin (DITROPAN) 5 MG tablet Take 2.5 mg by mouth daily.   3  . pantoprazole (PROTONIX) 40 MG tablet Take 40 mg by mouth 2 (two) times daily.      . valACYclovir (VALTREX) 1000 MG tablet Take 500 mg by mouth every other day.      No current facility-administered medications for this visit.     Allergies as of 01/21/2018 - Review Complete 01/21/2018  Allergen Reaction Noted  . Ciprofloxacin Swelling and Other (See Comments) 04/12/2013  . Codeine Nausea And Vomiting   . Ibuprofen Other (See Comments)   . Erythromycin Other (See Comments) 08/16/2014  . Shellfish allergy Rash 04/12/2013   Past Medical History:  Diagnosis Date  . Agoraphobia   . Anxiety disorder    Historically has seen a psychiatrist  . Asthma   . Bladder polyps   . Chest pain    Cardiolite 2009, no ischemia, EF 53%  . Essential hypertension, benign   . GERD (gastroesophageal reflux disease)   . Herpes   . Irregular heartbeat   . Nephrolithiasis   . Polysubstance abuse ([place])    History of cocaine, marijuana  . PTSD (post-traumatic stress disorder)   . Shoulder pain    Shoulder has been injected   Past  Surgical History:  Procedure Laterality Date  . APPENDECTOMY    . Bladder polyps    . CESAREAN SECTION WITH BILATERAL TUBAL LIGATION    . CHOLECYSTECTOMY    . COLONOSCOPY  May 2012   Dr. Posey Pronto: scattered diverticula pancolonic  . ESOPHAGOGASTRODUODENOSCOPY  April 2012   Dr. Posey Pronto: gastritis  . ESOPHAGOGASTRODUODENOSCOPY N/A 08/09/2013   [id number] polyps/abnormal gastric because of uncertain significance-status post biopsy. Small hiatal PrismBlog.es chronic inflammation on bx. hyperplastic polyp  . URETHRAL STRICTURE DILATATION    . WRIST FRACTURE SURGERY Right    Family History   Problem Relation Age of Onset  . Coronary artery disease Mother        CABG  . Colon cancer Paternal Uncle 40  . Stomach cancer Paternal Grandmother 87  . Stomach cancer Maternal Aunt 60  . Breast cancer Paternal Aunt   . Ovarian cancer Maternal Aunt    Social History   Tobacco Use  . Smoking status: Current Every Day Smoker    Packs/day: 1.00    Types: Cigarettes  . Smokeless tobacco: Never Used  . Tobacco comment: 2-3 ciggs per day  Substance Use Topics  . Alcohol use: No    Alcohol/week: 0.0 oz    Comment: Former user, heavy etoh use prior to 1990s.  . Drug use: Yes    Types: Marijuana, Cocaine    Comment: 01/21/18-denied cocaine, smokes weed     ROS:  General: Negative for anorexia, weight loss, fever, chills, fatigue, weakness. ENT: Negative for hoarseness, nasal congestion.see hpi CV: Negative for chest pain, angina, palpitations, dyspnea on exertion, peripheral edema.  Respiratory: Negative for dyspnea at rest, dyspnea on exertion, cough, sputum, wheezing.  GI: See history of present illness. GU:  Negative for dysuria, hematuria, urinary incontinence, urinary frequency, nocturnal urination.  Endo: Negative for unusual weight change.    Physical Examination:   BP 139/82   Pulse 67   Temp (!) 97.2 F (36.2 C) (Oral)   Ht 4\' 10"  (1.473 m)   Wt 141 lb (64 kg)   BMI 29.47 kg/m   General: Well-nourished, well-developed in no acute distress.  Eyes: No icterus. Mouth: Oropharyngeal mucosa moist and pink , no lesions erythema or exudate. Lungs: Clear to auscultation bilaterally.  Heart: Regular rate and rhythm, no murmurs rubs or gallops.  Abdomen: Bowel sounds are normal, moderate epig tenderness, nondistended, no hepatosplenomegaly or masses, no abdominal bruits or hernia , no rebound or guarding.   Extremities: No lower extremity edema. No clubbing or deformities. Neuro: Alert and oriented x 4   Skin: Warm and dry, no jaundice.   Psych: Alert and  cooperative, normal mood and affect.

## 2018-01-21 NOTE — Patient Instructions (Signed)
1. Upper endoscopy and colonoscopy as scheduled. See separate instructions.  

## 2018-01-23 NOTE — Patient Instructions (Signed)
Victoria Townsend  01/23/2018     @PREFPERIOPPHARMACY @   Your procedure is scheduled on 01/29/2018.  Report to [facility] at 11:30 A.M.  Call this number if you have problems the morning of surgery:  [phone]   Remember:  Do not eat food or drink liquids after midnight.  Take these medicines the morning of surgery with A SIP OF WATER Albuterol inhaler and bring with you, Verapamil, Xanax, Gabapentin, Ditropan  Protonix   Do not wear jewelry, make-up or nail polish.  Do not wear lotions, powders, or perfumes, or deodorant.  Do not shave 48 hours prior to surgery.  Men may shave face and neck.  Do not bring valuables to the hospital.  [facility] is not responsible for any belongings or valuables.  Contacts, dentures or bridgework may not be worn into surgery.  Leave your suitcase in the car.  After surgery it may be brought to your room.  For patients admitted to the hospital, discharge time will be determined by your treatment team.  Patients discharged the day of surgery will not be allowed to drive home.    Please read over the following fact sheets that you were given. Anesthesia Post-op Instructions     PATIENT INSTRUCTIONS POST-ANESTHESIA  IMMEDIATELY FOLLOWING SURGERY:  Do not drive or operate machinery for the first twenty four hours after surgery.  Do not make any important decisions for twenty four hours after surgery or while taking narcotic pain medications or sedatives.  If you develop intractable nausea and vomiting or a severe headache please notify your doctor immediately.  FOLLOW-UP:  Please make an appointment with your surgeon as instructed. You do not need to follow up with anesthesia unless specifically instructed to do so.  WOUND CARE INSTRUCTIONS (if applicable):  Keep a dry clean dressing on the anesthesia/puncture wound site if there is drainage.  Once the wound has quit draining you may leave it open to air.  Generally you should leave the bandage  intact for twenty four hours unless there is drainage.  If the epidural site drains for more than 36-48 hours please call the anesthesia department.  QUESTIONS?:  Please feel free to call your physician or the hospital operator if you have any questions, and they will be happy to assist you.      Esophagogastroduodenoscopy Esophagogastroduodenoscopy (EGD) is a procedure to examine the lining of the esophagus, stomach, and first part of the small intestine (duodenum). This procedure is done to check for problems such as inflammation, bleeding, ulcers, or growths. During this procedure, a long, flexible, lighted tube with a camera attached (endoscope) is inserted down the throat. Tell a health care provider about:  Any allergies you have.  All medicines you are taking, including vitamins, herbs, eye drops, creams, and over-the-counter medicines.  Any problems you or family members have had with anesthetic medicines.  Any blood disorders you have.  Any surgeries you have had.  Any medical conditions you have.  Whether you are pregnant or may be pregnant. What are the risks? Generally, this is a safe procedure. However, problems may occur, including:  Infection.  Bleeding.  A tear (perforation) in the esophagus, stomach, or duodenum.  Trouble breathing.  Excessive sweating.  Spasms of the larynx.  A slowed heartbeat.  Low blood pressure.  What happens before the procedure?  Follow instructions from your health care provider about eating or drinking restrictions.  Ask your health care provider about: ? Changing or stopping your  regular medicines. This is especially important if you are taking diabetes medicines or blood thinners. ? Taking medicines such as aspirin and ibuprofen. These medicines can thin your blood. Do not take these medicines before your procedure if your health care provider instructs you not to.  Plan to have someone take you home after the  procedure.  If you wear dentures, be ready to remove them before the procedure. What happens during the procedure?  To reduce your risk of infection, your health care team will wash or sanitize their hands.  An IV tube will be put in a vein in your hand or arm. You will get medicines and fluids through this tube.  You will be given one or more of the following: ? A medicine to help you relax (sedative). ? A medicine to numb the area (local anesthetic). This medicine may be sprayed into your throat. It will make you feel more comfortable and keep you from gagging or coughing during the procedure. ? A medicine for pain.  A mouth guard may be placed in your mouth to protect your teeth and to keep you from biting on the endoscope.  You will be asked to lie on your left side.  The endoscope will be lowered down your throat into your esophagus, stomach, and duodenum.  Air will be put into the endoscope. This will help your health care provider see better.  The lining of your esophagus, stomach, and duodenum will be examined.  Your health care provider may: ? Take a tissue sample so it can be looked at in a lab (biopsy). ? Remove growths. ? Remove objects (foreign bodies) that are stuck. ? Treat any bleeding with medicines or other devices that stop tissue from bleeding. ? Widen (dilate) or stretch narrowed areas of your esophagus and stomach.  The endoscope will be taken out. The procedure may vary among health care providers and hospitals. What happens after the procedure?  Your blood pressure, heart rate, breathing rate, and blood oxygen level will be monitored often until the medicines you were given have worn off.  Do not eat or drink anything until the numbing medicine has worn off and your gag reflex has returned. This information is not intended to replace advice given to you by your health care provider. Make sure you discuss any questions you have with your health care  provider. Document Released: 03/14/2005 Document Revised: 04/18/2016 Document Reviewed: 10/05/2015 Elsevier Interactive Patient Education  2018 [employer]. Esophageal Dilatation Esophageal dilatation is a procedure to open a blocked or narrowed part of the esophagus. The esophagus is the long tube in your throat that carries food and liquid from your mouth to your stomach. The procedure is also called esophageal dilation. You may need this procedure if you have a buildup of scar tissue in your esophagus that makes it difficult, painful, or even impossible to swallow. This can be caused by gastroesophageal reflux disease (GERD). In rare cases, people need this procedure because they have cancer of the esophagus or a problem with the way food moves through the esophagus. Sometimes you may need to have another dilatation to enlarge the opening of the esophagus gradually. Tell a health care provider about:  Any allergies you have.  All medicines you are taking, including vitamins, herbs, eye drops, creams, and over-the-counter medicines.  Any problems you or family members have had with anesthetic medicines.  Any blood disorders you have.  Any surgeries you have had.  Any medical conditions you  have.  Any antibiotic medicines you are required to take before dental procedures. What are the risks? Generally, this is a safe procedure. However, problems can occur and include:  Bleeding from a tear in the lining of the esophagus.  A hole (perforation) in the esophagus.  What happens before the procedure?  Do not eat or drink anything after midnight on the night before the procedure or as directed by your health care provider.  Ask your health care provider about changing or stopping your regular medicines. This is especially important if you are taking diabetes medicines or blood thinners.  Plan to have someone take you home after the procedure. What happens during the procedure?  You  will be given a medicine that makes you relaxed and sleepy (sedative).  A medicine may be sprayed or gargled to numb the back of the throat.  Your health care provider can use various instruments to do an esophageal dilatation. During the procedure, the instrument used will be placed in your mouth and passed down into your esophagus. Options include: ? Simple dilators. This instrument is carefully placed in the esophagus to stretch it. ? Guided wire bougies. In this method, a flexible tube (endoscope) is used to insert a wire into the esophagus. The dilator is passed over this wire to enlarge the esophagus. Then the wire is removed. ? Balloon dilators. An endoscope with a small balloon at the end is passed down into the esophagus. Inflating the balloon gently stretches the esophagus and opens it up. What happens after the procedure?  Your blood pressure, heart rate, breathing rate, and blood oxygen level will be monitored often until the medicines you were given have worn off.  Your throat may feel slightly sore and will probably still feel numb. This will improve slowly over time.  You will not be allowed to eat or drink until the throat numbness has resolved.  If this is a same-day procedure, you may be allowed to go home once you have been able to drink, urinate, and sit on the edge of the bed without nausea or dizziness.  If this is a same-day procedure, you should have a friend or family member with you for the next 24 hours after the procedure. This information is not intended to replace advice given to you by your health care provider. Make sure you discuss any questions you have with your health care provider. Document Released: 01/02/2006 Document Revised: 04/18/2016 Document Reviewed: 03/23/2014 Elsevier Interactive Patient Education  [employer]. Colonoscopy, Adult A colonoscopy is an exam to look at the entire large intestine. During the exam, a lubricated, bendable tube is  inserted into the anus and then passed into the rectum, colon, and other parts of the large intestine. A colonoscopy is often done as a part of normal colorectal screening or in response to certain symptoms, such as anemia, persistent diarrhea, abdominal pain, and blood in the stool. The exam can help screen for and diagnose medical problems, including:  Tumors.  Polyps.  Inflammation.  Areas of bleeding.  Tell a health care provider about:  Any allergies you have.  All medicines you are taking, including vitamins, herbs, eye drops, creams, and over-the-counter medicines.  Any problems you or family members have had with anesthetic medicines.  Any blood disorders you have.  Any surgeries you have had.  Any medical conditions you have.  Any problems you have had passing stool. What are the risks? Generally, this is a safe procedure. However, problems  may occur, including:  Bleeding.  A tear in the intestine.  A reaction to medicines given during the exam.  Infection (rare).  What happens before the procedure? Eating and drinking restrictions Follow instructions from your health care provider about eating and drinking, which may include:  A few days before the procedure - follow a low-fiber diet. Avoid nuts, seeds, dried fruit, raw fruits, and vegetables.  1-3 days before the procedure - follow a clear liquid diet. Drink only clear liquids, such as clear broth or bouillon, black coffee or tea, clear juice, clear soft drinks or sports drinks, gelatin dessert, and popsicles. Avoid any liquids that contain red or purple dye.  On the day of the procedure - do not eat or drink anything during the 2 hours before the procedure, or within the time period that your health care provider recommends.  Bowel prep If you were prescribed an oral bowel prep to clean out your colon:  Take it as told by your health care provider. Starting the day before your procedure, you will need to  drink a large amount of medicated liquid. The liquid will cause you to have multiple loose stools until your stool is almost clear or light green.  If your skin or anus gets irritated from diarrhea, you may use these to relieve the irritation: ? Medicated wipes, such as adult wet wipes with aloe and vitamin E. ? A skin soothing-product like petroleum jelly.  If you vomit while drinking the bowel prep, take a break for up to 60 minutes and then begin the bowel prep again. If vomiting continues and you cannot take the bowel prep without vomiting, call your health care provider.  General instructions  Ask your health care provider about changing or stopping your regular medicines. This is especially important if you are taking diabetes medicines or blood thinners.  Plan to have someone take you home from the hospital or clinic. What happens during the procedure?  An IV tube may be inserted into one of your veins.  You will be given medicine to help you relax (sedative).  To reduce your risk of infection: ? Your health care team will wash or sanitize their hands. ? Your anal area will be washed with soap.  You will be asked to lie on your side with your knees bent.  Your health care provider will lubricate a long, thin, flexible tube. The tube will have a camera and a light on the end.  The tube will be inserted into your anus.  The tube will be gently eased through your rectum and colon.  Air will be delivered into your colon to keep it open. You may feel some pressure or cramping.  The camera will be used to take images during the procedure.  A small tissue sample may be removed from your body to be examined under a microscope (biopsy). If any potential problems are found, the tissue will be sent to a lab for testing.  If small polyps are found, your health care provider may remove them and have them checked for cancer cells.  The tube that was inserted into your anus will be  slowly removed. The procedure may vary among health care providers and hospitals. What happens after the procedure?  Your blood pressure, heart rate, breathing rate, and blood oxygen level will be monitored until the medicines you were given have worn off.  Do not drive for 24 hours after the exam.  You may have a small amount  of blood in your stool.  You may pass gas and have mild abdominal cramping or bloating due to the air that was used to inflate your colon during the exam.  It is up to you to get the results of your procedure. Ask your health care provider, or the department performing the procedure, when your results will be ready. This information is not intended to replace advice given to you by your health care provider. Make sure you discuss any questions you have with your health care provider. Document Released: 11/08/2000 Document Revised: 09/11/2016 Document Reviewed: 01/23/2016 Elsevier Interactive Patient Education  2018 [employer].

## 2018-01-27 ENCOUNTER — Other Ambulatory Visit: Payer: Self-pay

## 2018-01-27 ENCOUNTER — Encounter (HOSPITAL_COMMUNITY)
Admission: RE | Admit: 2018-01-27 | Discharge: 2018-01-27 | Disposition: A | Discharge status: Home / Self Care | Payer: Medicaid Other | Source: Ambulatory Visit | Attending: Internal Medicine | Admitting: Internal Medicine

## 2018-01-27 ENCOUNTER — Encounter (HOSPITAL_COMMUNITY): Payer: Self-pay

## 2018-01-27 DIAGNOSIS — Z01812 Encounter for preprocedural laboratory examination: Secondary | ICD-10-CM | POA: Diagnosis present

## 2018-01-27 DIAGNOSIS — Z01810 Encounter for preprocedural cardiovascular examination: Secondary | ICD-10-CM | POA: Insufficient documentation

## 2018-01-27 LAB — CBC WITH DIFFERENTIAL/PLATELET
BASOS ABS: 0 10*3/uL (ref 0.0–0.1)
BASOS PCT: 0 %
EOS ABS: 0.1 10*3/uL (ref 0.0–0.7)
Eosinophils Relative: 1 %
HCT: 43 % (ref 36.0–46.0)
Hemoglobin: 13.6 g/dL (ref 12.0–15.0)
Lymphocytes Relative: 41 %
Lymphs Abs: 3.2 10*3/uL (ref 0.7–4.0)
MCH: 30.8 pg (ref 26.0–34.0)
MCHC: 31.6 g/dL (ref 30.0–36.0)
MCV: 97.3 fL (ref 78.0–100.0)
Monocytes Absolute: 0.6 10*3/uL (ref 0.1–1.0)
Monocytes Relative: 7 %
Neutro Abs: 3.9 10*3/uL (ref 1.7–7.7)
Neutrophils Relative %: 51 %
PLATELETS: 297 10*3/uL (ref 150–400)
RBC: 4.42 MIL/uL (ref 3.87–5.11)
RDW: 12.9 % (ref 11.5–15.5)
WBC: 7.7 10*3/uL (ref 4.0–10.5)

## 2018-01-27 LAB — BASIC METABOLIC PANEL
ANION GAP: 14 (ref 5–15)
BUN: 7 mg/dL (ref 6–20)
CO2: 22 mmol/L (ref 22–32)
Calcium: 9.6 mg/dL (ref 8.9–10.3)
Chloride: 102 mmol/L (ref 101–111)
Creatinine, Ser: 0.66 mg/dL (ref 0.44–1.00)
GFR calc Af Amer: >60 mL/min (ref >60)
Glucose, Bld: 78 mg/dL (ref 65–99)
POTASSIUM: 3.1 mmol/L — AB (ref 3.5–5.1)
SODIUM: 138 mmol/L (ref 135–145)

## 2018-01-27 NOTE — Assessment & Plan Note (Signed)
57 year old female with ongoing rectal bleeding, she reports history of colon polyps. Her last colonoscopy was in 2012 by Dr. Posey Pronto. We recommend proceeding with colonoscopy in the near future with propofol.  I have discussed the risks, alternatives, benefits with regards to but not limited to the risk of reaction to medication, bleeding, infection, perforation and the patient is agreeable to proceed. Written consent to be obtained.

## 2018-01-27 NOTE — Progress Notes (Signed)
cc'ed to pcp °

## 2018-01-27 NOTE — Assessment & Plan Note (Signed)
Chronic epigastric pain sometimes related to meals. She has a history of gastritis in the past. Reflux is out of control as well. Complains of esophageal dysphagia. She needs an upper endoscopy with possible esophageal dilation to rule out peptic ulcer disease, complicated Gerd, malignancy. Plan for EGD with possible esophageal dilation with propofol in the near future.  I have discussed the risks, alternatives, benefits with regards to but not limited to the risk of reaction to medication, bleeding, infection, perforation and the patient is agreeable to proceed. Written consent to be obtained.

## 2018-01-28 NOTE — Pre-Procedure Instructions (Signed)
Potassium of 3.1 reported to Dr Patsey Berthold. No orders given.

## 2018-01-29 ENCOUNTER — Ambulatory Visit (HOSPITAL_COMMUNITY): Payer: Medicaid Other | Admitting: Anesthesiology

## 2018-01-29 ENCOUNTER — Encounter (HOSPITAL_COMMUNITY)
Admission: RE | Disposition: A | Discharge status: Home / Self Care | Payer: Self-pay | Source: Ambulatory Visit | Attending: Internal Medicine

## 2018-01-29 ENCOUNTER — Encounter (HOSPITAL_COMMUNITY): Payer: Self-pay | Admitting: *Deleted

## 2018-01-29 ENCOUNTER — Ambulatory Visit (HOSPITAL_COMMUNITY)
Admission: RE | Admit: 2018-01-29 | Discharge: 2018-01-29 | Disposition: A | Discharge status: Home / Self Care | Payer: Medicaid Other | Source: Ambulatory Visit | Attending: Internal Medicine | Admitting: Internal Medicine

## 2018-01-29 DIAGNOSIS — I10 Essential (primary) hypertension: Secondary | ICD-10-CM | POA: Insufficient documentation

## 2018-01-29 DIAGNOSIS — R1013 Epigastric pain: Secondary | ICD-10-CM

## 2018-01-29 DIAGNOSIS — Z803 Family history of malignant neoplasm of breast: Secondary | ICD-10-CM | POA: Insufficient documentation

## 2018-01-29 DIAGNOSIS — I499 Cardiac arrhythmia, unspecified: Secondary | ICD-10-CM | POA: Diagnosis not present

## 2018-01-29 DIAGNOSIS — D414 Neoplasm of uncertain behavior of bladder: Secondary | ICD-10-CM | POA: Insufficient documentation

## 2018-01-29 DIAGNOSIS — R1319 Other dysphagia: Secondary | ICD-10-CM

## 2018-01-29 DIAGNOSIS — Z9049 Acquired absence of other specified parts of digestive tract: Secondary | ICD-10-CM | POA: Insufficient documentation

## 2018-01-29 DIAGNOSIS — F17210 Nicotine dependence, cigarettes, uncomplicated: Secondary | ICD-10-CM | POA: Diagnosis not present

## 2018-01-29 DIAGNOSIS — K641 Second degree hemorrhoids: Secondary | ICD-10-CM | POA: Insufficient documentation

## 2018-01-29 DIAGNOSIS — Z79899 Other long term (current) drug therapy: Secondary | ICD-10-CM | POA: Insufficient documentation

## 2018-01-29 DIAGNOSIS — Z800 Family history of malignant neoplasm of digestive organs: Secondary | ICD-10-CM | POA: Diagnosis not present

## 2018-01-29 DIAGNOSIS — K625 Hemorrhage of anus and rectum: Secondary | ICD-10-CM

## 2018-01-29 DIAGNOSIS — K449 Diaphragmatic hernia without obstruction or gangrene: Secondary | ICD-10-CM | POA: Insufficient documentation

## 2018-01-29 DIAGNOSIS — Z7982 Long term (current) use of aspirin: Secondary | ICD-10-CM | POA: Diagnosis not present

## 2018-01-29 DIAGNOSIS — Z8249 Family history of ischemic heart disease and other diseases of the circulatory system: Secondary | ICD-10-CM | POA: Diagnosis not present

## 2018-01-29 DIAGNOSIS — K219 Gastro-esophageal reflux disease without esophagitis: Secondary | ICD-10-CM | POA: Diagnosis not present

## 2018-01-29 DIAGNOSIS — Z8041 Family history of malignant neoplasm of ovary: Secondary | ICD-10-CM | POA: Diagnosis not present

## 2018-01-29 DIAGNOSIS — K5730 Diverticulosis of large intestine without perforation or abscess without bleeding: Secondary | ICD-10-CM | POA: Diagnosis not present

## 2018-01-29 DIAGNOSIS — K921 Melena: Secondary | ICD-10-CM

## 2018-01-29 DIAGNOSIS — F4000 Agoraphobia, unspecified: Secondary | ICD-10-CM | POA: Diagnosis not present

## 2018-01-29 DIAGNOSIS — J45909 Unspecified asthma, uncomplicated: Secondary | ICD-10-CM | POA: Diagnosis not present

## 2018-01-29 DIAGNOSIS — D123 Benign neoplasm of transverse colon: Secondary | ICD-10-CM | POA: Diagnosis not present

## 2018-01-29 DIAGNOSIS — Z86010 Personal history of colonic polyps: Secondary | ICD-10-CM | POA: Insufficient documentation

## 2018-01-29 DIAGNOSIS — R1310 Dysphagia, unspecified: Secondary | ICD-10-CM

## 2018-01-29 DIAGNOSIS — Z7951 Long term (current) use of inhaled steroids: Secondary | ICD-10-CM | POA: Insufficient documentation

## 2018-01-29 DIAGNOSIS — F419 Anxiety disorder, unspecified: Secondary | ICD-10-CM | POA: Diagnosis not present

## 2018-01-29 DIAGNOSIS — F4310 Post-traumatic stress disorder, unspecified: Secondary | ICD-10-CM | POA: Diagnosis not present

## 2018-01-29 HISTORY — PX: ESOPHAGOGASTRODUODENOSCOPY (EGD) WITH PROPOFOL: SHX5813

## 2018-01-29 HISTORY — PX: MALONEY DILATION: SHX5535

## 2018-01-29 HISTORY — PX: COLONOSCOPY WITH PROPOFOL: SHX5780

## 2018-01-29 SURGERY — ESOPHAGOGASTRODUODENOSCOPY (EGD) WITH PROPOFOL
Anesthesia: Monitor Anesthesia Care

## 2018-01-29 MED ORDER — FENTANYL CITRATE (PF) 100 MCG/2ML IJ SOLN
INTRAMUSCULAR | Status: AC
  Filled 2018-01-29: qty 2

## 2018-01-29 MED ORDER — FENTANYL CITRATE (PF) 100 MCG/2ML IJ SOLN
25.0000 ug | Freq: Once | INTRAMUSCULAR | Status: AC
  Administered 2018-01-29: 25 ug via INTRAVENOUS

## 2018-01-29 MED ORDER — MIDAZOLAM HCL 2 MG/2ML IJ SOLN
1.0000 mg | INTRAMUSCULAR | Status: AC
  Administered 2018-01-29 (×2): 2 mg via INTRAVENOUS
  Filled 2018-01-29: qty 2

## 2018-01-29 MED ORDER — LIDOCAINE VISCOUS 2 % MT SOLN
OROMUCOSAL | Status: AC
  Filled 2018-01-29: qty 15

## 2018-01-29 MED ORDER — PROPOFOL 10 MG/ML IV BOLUS
INTRAVENOUS | Status: DC | PRN
  Administered 2018-01-29 (×2): 20 mg via INTRAVENOUS

## 2018-01-29 MED ORDER — PROPOFOL 500 MG/50ML IV EMUL
INTRAVENOUS | Status: DC | PRN
  Administered 2018-01-29: 150 ug/kg/min via INTRAVENOUS
  Administered 2018-01-29: 15:00:00 via INTRAVENOUS

## 2018-01-29 MED ORDER — CHLORHEXIDINE GLUCONATE CLOTH 2 % EX PADS: 6.0000 | MEDICATED_PAD | Freq: Once | CUTANEOUS | Status: DC

## 2018-01-29 MED ORDER — MIDAZOLAM HCL 2 MG/2ML IJ SOLN
INTRAMUSCULAR | Status: AC
  Filled 2018-01-29: qty 2

## 2018-01-29 MED ORDER — LACTATED RINGERS IV SOLN
INTRAVENOUS | Status: DC
  Administered 2018-01-29: 13:00:00 via INTRAVENOUS

## 2018-01-29 NOTE — Discharge Instructions (Signed)
°Colonoscopy °Discharge Instructions ° °Read the instructions outlined below and refer to this sheet in the next few weeks. These discharge instructions provide you with general information on caring for yourself after you leave the hospital. Your doctor may also give you specific instructions. While your treatment has been planned according to the most current medical practices available, unavoidable complications occasionally occur. If you have any problems or questions after discharge, call Dr. Rourk at [phone]. °ACTIVITY °· You may resume your regular activity, but move at a slower pace for the next 24 hours.  °· Take frequent rest periods for the next 24 hours.  °· Walking will help get rid of the air and reduce the bloated feeling in your belly (abdomen).  °· No driving for 24 hours (because of the medicine (anesthesia) used during the test).   °· Do not sign any important legal documents or operate any machinery for 24 hours (because of the anesthesia used during the test).  °NUTRITION °· Drink plenty of fluids.  °· You may resume your normal diet as instructed by your doctor.  °· Begin with a light meal and progress to your normal diet. Heavy or fried foods are harder to digest and may make you feel sick to your stomach (nauseated).  °· Avoid alcoholic beverages for 24 hours or as instructed.  °MEDICATIONS °· You may resume your normal medications unless your doctor tells you otherwise.  °WHAT YOU CAN EXPECT TODAY °· Some feelings of bloating in the abdomen.  °· Passage of more gas than usual.  °· Spotting of blood in your stool or on the toilet paper.  °IF YOU HAD POLYPS REMOVED DURING THE COLONOSCOPY: °· No aspirin products for 7 days or as instructed.  °· No alcohol for 7 days or as instructed.  °· Eat a soft diet for the next 24 hours.  °FINDING OUT THE RESULTS OF YOUR TEST °Not all test results are available during your visit. If your test results are not back during the visit, make an appointment  with your caregiver to find out the results. Do not assume everything is normal if you have not heard from your caregiver or the medical facility. It is important for you to follow up on all of your test results.  °SEEK IMMEDIATE MEDICAL ATTENTION IF: °· You have more than a spotting of blood in your stool.  °· Your belly is swollen (abdominal distention).  °· You are nauseated or vomiting.  °· You have a temperature over 101.  °· You have abdominal pain or discomfort that is severe or gets worse throughout the day.  °EGD °Discharge instructions °Please read the instructions outlined below and refer to this sheet in the next few weeks. These discharge instructions provide you with general information on caring for yourself after you leave the hospital. Your doctor may also give you specific instructions. While your treatment has been planned according to the most current medical practices available, unavoidable complications occasionally occur. If you have any problems or questions after discharge, please call your doctor. °ACTIVITY °· You may resume your regular activity but move at a slower pace for the next 24 hours.  °· Take frequent rest periods for the next 24 hours.  °· Walking will help expel (get rid of) the air and reduce the bloated feeling in your abdomen.  °· No driving for 24 hours (because of the anesthesia (medicine) used during the test).  °· You may shower.  °· Do not sign any important   legal documents or operate any machinery for 24 hours (because of the anesthesia used during the test).  NUTRITION  Drink plenty of fluids.   You may resume your normal diet.   Begin with a light meal and progress to your normal diet.   Avoid alcoholic beverages for 24 hours or as instructed by your caregiver.  MEDICATIONS  You may resume your normal medications unless your caregiver tells you otherwise.  WHAT YOU CAN EXPECT TODAY  You may experience abdominal discomfort such as a feeling of fullness  or gas pains.  FOLLOW-UP  Your doctor will discuss the results of your test with you.  SEEK IMMEDIATE MEDICAL ATTENTION IF ANY OF THE FOLLOWING OCCUR:  Excessive nausea (feeling sick to your stomach) and/or vomiting.   Severe abdominal pain and distention (swelling).   Trouble swallowing.   Temperature over 101 F (37.8 C).   Continue Protonix 40 mg twice daily.  Hemorrhoid, polyp and diverticulosis information provided  Anusol HC cream apply to the anorectum 3 times daily  Begin Benefiber 1 tablespoon twice daily  Other recommendations to follow pending review of pathology report   Resume all regular medications    Office visit with [place] in 3 months  Rectal bleeding or vomiting of blood.       Hemorrhoids Hemorrhoids are swollen veins in and around the rectum or anus. There are two types of hemorrhoids:  Internal hemorrhoids. These occur in the veins that are just inside the rectum. They may poke through to the outside and become irritated and painful.  External hemorrhoids. These occur in the veins that are outside of the anus and can be felt as a painful swelling or hard lump near the anus.  Most hemorrhoids do not cause serious problems, and they can be managed with home treatments such as diet and lifestyle changes. If home treatments do not help your symptoms, procedures can be done to shrink or remove the hemorrhoids. What are the causes? This condition is caused by increased pressure in the anal area. This pressure may result from various things, including:  Constipation.  Straining to have a bowel movement.  Diarrhea.  Pregnancy.  Obesity.  Sitting for long periods of time.  Heavy lifting or other activity that causes you to strain.  Anal sex.  What are the signs or symptoms? Symptoms of this condition include:  Pain.  Anal itching or irritation.  Rectal bleeding.  Leakage of stool (feces).  Anal swelling.  One or more lumps  around the anus.  How is this diagnosed? This condition can often be diagnosed through a visual exam. Other exams or tests may also be done, such as:  Examination of the rectal area with a gloved hand (digital rectal exam).  Examination of the anal canal using a small tube (anoscope).  A blood test, if you have lost a significant amount of blood.  A test to look inside the colon (sigmoidoscopy or colonoscopy).  How is this treated? This condition can usually be treated at home. However, various procedures may be done if dietary changes, lifestyle changes, and other home treatments do not help your symptoms. These procedures can help make the hemorrhoids smaller or remove them completely. Some of these procedures involve surgery, and others do not. Common procedures include:  Rubber band ligation. Rubber bands are placed at the base of the hemorrhoids to cut off the blood supply to them.  Sclerotherapy. Medicine is injected into the hemorrhoids to shrink them.  Infrared coagulation.  A type of light energy is used to get rid of the hemorrhoids.  Hemorrhoidectomy surgery. The hemorrhoids are surgically removed, and the veins that supply them are tied off.  Stapled hemorrhoidopexy surgery. A circular stapling device is used to remove the hemorrhoids and use staples to cut off the blood supply to them.  Follow these instructions at home: Eating and drinking  Eat foods that have a lot of fiber in them, such as whole grains, beans, nuts, fruits, and vegetables. Ask your health care provider about taking products that have added fiber (fiber supplements).  Drink enough fluid to keep your urine clear or pale yellow. Managing pain and swelling  Take warm sitz baths for 20 minutes, 3-4 times a day to ease pain and discomfort.  If directed, apply ice to the affected area. Using ice packs between sitz baths may be helpful. ? Put ice in a plastic bag. ? Place a towel between your skin and the  bag. ? Leave the ice on for 20 minutes, 2-3 times a day. General instructions  Take over-the-counter and prescription medicines only as told by your health care provider.  Use medicated creams or suppositories as told.  Exercise regularly.  Go to the bathroom when you have the urge to have a bowel movement. Do not wait.  Avoid straining to have bowel movements.  Keep the anal area dry and clean. Use wet toilet paper or moist towelettes after a bowel movement.  Do not sit on the toilet for long periods of time. This increases blood pooling and pain. Contact a health care provider if:  You have increasing pain and swelling that are not controlled by treatment or medicine.  You have uncontrolled bleeding.  You have difficulty having a bowel movement, or you are unable to have a bowel movement.  You have pain or inflammation outside the area of the hemorrhoids. This information is not intended to replace advice given to you by your health care provider. Make sure you discuss any questions you have with your health care provider. Document Released: 11/08/2000 Document Revised: 04/10/2016 Document Reviewed: 07/26/2015 Elsevier Interactive Patient Education  2018 [employer].     Colon Polyps Polyps are tissue growths inside the body. Polyps can grow in many places, including the large intestine (colon). A polyp may be a round bump or a mushroom-shaped growth. You could have one polyp or several. Most colon polyps are noncancerous (benign). However, some colon polyps can become cancerous over time. What are the causes? The exact cause of colon polyps is not known. What increases the risk? This condition is more likely to develop in people who:  Have a family history of colon cancer or colon polyps.  Are older than 36 or older than 45 if they are African American.  Have inflammatory bowel disease, such as ulcerative colitis or Crohn disease.  Are overweight.  Smoke  cigarettes.  Do not get enough exercise.  Drink too much alcohol.  Eat a diet that is: ? High in fat and red meat. ? Low in fiber.  Had childhood cancer that was treated with abdominal radiation.  What are the signs or symptoms? Most polyps do not cause symptoms. If you have symptoms, they may include:  Blood coming from your rectum when having a bowel movement.  Blood in your stool.The stool may look dark red or black.  A change in bowel habits, such as constipation or diarrhea.  How is this diagnosed? This condition is diagnosed with a  colonoscopy. This is a procedure that uses a lighted, flexible scope to look at the inside of your colon. How is this treated? Treatment for this condition involves removing any polyps that are found. Those polyps will then be tested for cancer. If cancer is found, your health care provider will talk to you about options for colon cancer treatment. Follow these instructions at home: Diet  Eat plenty of fiber, such as fruits, vegetables, and whole grains.  Eat foods that are high in calcium and vitamin D, such as milk, cheese, yogurt, eggs, liver, fish, and broccoli.  Limit foods high in fat, red meats, and processed meats, such as hot dogs, sausage, bacon, and lunch meats.  Maintain a healthy weight, or lose weight if recommended by your health care provider. General instructions  Do not smoke cigarettes.  Do not drink alcohol excessively.  Keep all follow-up visits as told by your health care provider. This is important. This includes keeping regularly scheduled colonoscopies. Talk to your health care provider about when you need a colonoscopy.  Exercise every day or as told by your health care provider. Contact a health care provider if:  You have new or worsening bleeding during a bowel movement.  You have new or increased blood in your stool.  You have a change in bowel habits.  You unexpectedly lose weight. This information  is not intended to replace advice given to you by your health care provider. Make sure you discuss any questions you have with your health care provider. Document Released: 08/07/2004 Document Revised: 04/18/2016 Document Reviewed: 10/02/2015 Elsevier Interactive Patient Education  [employer].    Diverticulosis Diverticulosis is a condition that develops when small pouches (diverticula) form in the wall of the large intestine (colon). The colon is where water is absorbed and stool is formed. The pouches form when the inside layer of the colon pushes through weak spots in the outer layers of the colon. You may have a few pouches or many of them. What are the causes? The cause of this condition is not known. What increases the risk? The following factors may make you more likely to develop this condition:  Being older than age 61. Your risk for this condition increases with age. Diverticulosis is rare among people younger than age 38. By age 31, many people have it.  Eating a low-fiber diet.  Having frequent constipation.  Being overweight.  Not getting enough exercise.  Smoking.  Taking over-the-counter pain medicines, like aspirin and ibuprofen.  Having a family history of diverticulosis.  What are the signs or symptoms? In most people, there are no symptoms of this condition. If you do have symptoms, they may include:  Bloating.  Cramps in the abdomen.  Constipation or diarrhea.  Pain in the lower left side of the abdomen.  How is this diagnosed? This condition is most often diagnosed during an exam for other colon problems. Because diverticulosis usually has no symptoms, it often cannot be diagnosed independently. This condition may be diagnosed by:  Using a flexible scope to examine the colon (colonoscopy).  Taking an X-ray of the colon after dye has been put into the colon (barium enema).  Doing a CT scan.  How is this treated? You may not need treatment  for this condition if you have never developed an infection related to diverticulosis. If you have had an infection before, treatment may include:  Eating a high-fiber diet. This may include eating more fruits, vegetables, and grains.  Taking a fiber supplement.  Taking a live bacteria supplement (probiotic).  Taking medicine to relax your colon.  Taking antibiotic medicines.  Follow these instructions at home:  Drink 6-8 glasses of water or more each day to prevent constipation.  Try not to strain when you have a bowel movement.  If you have had an infection before: ? Eat more fiber as directed by your health care provider or your diet and nutrition specialist (dietitian). ? Take a fiber supplement or probiotic, if your health care provider approves.  Take over-the-counter and prescription medicines only as told by your health care provider.  If you were prescribed an antibiotic, take it as told by your health care provider. Do not stop taking the antibiotic even if you start to feel better.  Keep all follow-up visits as told by your health care provider. This is important. Contact a health care provider if:  You have pain in your abdomen.  You have bloating.  You have cramps.  You have not had a bowel movement in 3 days. Get help right away if:  Your pain gets worse.  Your bloating becomes very bad.  You have a fever or chills, and your symptoms suddenly get worse.  You vomit.  You have bowel movements that are bloody or black.  You have bleeding from your rectum. Summary  Diverticulosis is a condition that develops when small pouches (diverticula) form in the wall of the large intestine (colon).  You may have a few pouches or many of them.  This condition is most often diagnosed during an exam for other colon problems.  If you have had an infection related to diverticulosis, treatment may include increasing the fiber in your diet, taking supplements, or  taking medicines. This information is not intended to replace advice given to you by your health care provider. Make sure you discuss any questions you have with your health care provider. Document Released: 08/08/2004 Document Revised: 09/30/2016 Document Reviewed: 09/30/2016 Elsevier Interactive Patient Education  2017 [employer]   PATIENT INSTRUCTIONS POST-ANESTHESIA  IMMEDIATELY FOLLOWING SURGERY:  Do not drive or operate machinery for the first twenty four hours after surgery.  Do not make any important decisions for twenty four hours after surgery or while taking narcotic pain medications or sedatives.  If you develop intractable nausea and vomiting or a severe headache please notify your doctor immediately.  FOLLOW-UP:  Please make an appointment with your surgeon as instructed. You do not need to follow up with anesthesia unless specifically instructed to do so.  WOUND CARE INSTRUCTIONS (if applicable):  Keep a dry clean dressing on the anesthesia/puncture wound site if there is drainage.  Once the wound has quit draining you may leave it open to air.  Generally you should leave the bandage intact for twenty four hours unless there is drainage.  If the epidural site drains for more than 36-48 hours please call the anesthesia department.  QUESTIONS?:  Please feel free to call your physician or the hospital operator if you have any questions, and they will be happy to assist you.

## 2018-01-29 NOTE — Interval H&P Note (Signed)
History and Physical Interval Note:  01/29/2018 2:41 PM  Victoria Townsend  has presented today for surgery, with the diagnosis of GERD, rectal bleeding, dysphagia, epipain  The various methods of treatment have been discussed with the patient and family. After consideration of risks, benefits and other options for treatment, the patient has consented to  Procedure(s) with comments: ESOPHAGOGASTRODUODENOSCOPY (EGD) WITH PROPOFOL (N/A) - 1:30 COLONOSCOPY WITH PROPOFOL (N/A) MALONEY DILATION (N/A) as a surgical intervention .  The patient's history has been reviewed, patient examined, no change in status, stable for surgery.  I have reviewed the patient's chart and labs.  Questions were answered to the patient's satisfaction.     Victoria Townsend  No change. EGD with possible esophageal dilation and colonoscopy per plan.  The risks, benefits, limitations, imponderables and alternatives regarding both EGD and colonoscopy have been reviewed with the patient. Questions have been answered. All parties agreeable.

## 2018-01-29 NOTE — Transfer of Care (Signed)
Immediate Anesthesia Transfer of Care Note  Patient: Victoria Townsend  Procedure(s) Performed: ESOPHAGOGASTRODUODENOSCOPY (EGD) WITH PROPOFOL (N/A ) COLONOSCOPY WITH PROPOFOL (N/A ) MALONEY DILATION (N/A )  Patient Location: PACU  Anesthesia Type:MAC  Level of Consciousness: awake  Airway & Oxygen Therapy: Patient Spontanous Breathing  Post-op Assessment: Report given to RN  Post vital signs: Reviewed and stable  Last Vitals:  Vitals:   01/29/18 1440 01/29/18 1443  BP:    Pulse:    Resp: (!) 24 14  Temp:    SpO2: 98% 99%    Last Pain:  Vitals:   01/29/18 1123  TempSrc: Oral      Patients Stated Pain Goal: 6 (87/[id number])  Complications: No apparent anesthesia complications

## 2018-01-29 NOTE — Progress Notes (Signed)
Patient should be supplemented. Please send in KCL 70meq BID for 3 days. #6 and no refills.

## 2018-01-29 NOTE — Op Note (Signed)
[facility] Patient Name: Victoria Townsend Procedure Date: 01/29/2018 3:04 PM MRN: [id number] Date of Birth: [birth year] Attending MD: Norvel Richards , MD CSN: [id number] Age: 57 Admit Type: Outpatient Procedure:                Colonoscopy Indications:              Hematochezia Providers:                Norvel Richards, MD, Lurline Del, RN, Aram Candela Referring MD:              Medicines:                Propofol per Anesthesia Complications:            No immediate complications. Estimated Blood Loss:     Estimated blood loss was minimal. Procedure:                Pre-Anesthesia Assessment:                           - Prior to the procedure, a History and Physical                            was performed, and patient medications and                            allergies were reviewed. The patient's tolerance of                            previous anesthesia was also reviewed. The risks                            and benefits of the procedure and the sedation                            options and risks were discussed with the patient.                            All questions were answered, and informed consent                            was obtained. Prior Anticoagulants: The patient has                            taken no previous anticoagulant or antiplatelet                            agents. ASA Grade Assessment: II - A patient with                            mild systemic disease. After reviewing the risks                            and  benefits, the patient was deemed in                            satisfactory condition to undergo the procedure.                           After obtaining informed consent, the colonoscope                            was passed under direct vision. Throughout the                            procedure, the patient's blood pressure, pulse, and                            oxygen saturations were monitored  continuously. The                            [id number] ([id number]) scope was introduced through                            the and advanced to the the cecum, identified by                            appendiceal orifice and ileocecal valve. The                            colonoscopy was performed without difficulty. The                            patient tolerated the procedure well. The quality                            of the bowel preparation was adequate. The                            ileocecal valve, appendiceal orifice, and rectum                            were photographed. The entire colon was well                            visualized. The quality of the bowel preparation                            was adequate. Scope In: 3:08:47 PM Scope Out: 3:21:32 PM Scope Withdrawal Time: 0 hours 9 minutes 5 seconds  Total Procedure Duration: 0 hours 12 minutes 45 seconds  Findings:      The perianal and digital rectal examinations were normal.      Internal hemorrhoids were found during retroflexion. The hemorrhoids       were Grade II (internal hemorrhoids that prolapse but reduce       spontaneously).      A 4 mm polyp was found in the splenic flexure. The polyp  was sessile.       The polyp was removed with a cold snare. Resection and retrieval were       complete. Estimated blood loss was minimal.      Multiple medium-mouthed diverticula were found in the sigmoid colon and       descending colon.      The exam was otherwise without abnormality on direct and retroflexion       views. Impression:               - Internal hemorrhoids.                           - One 4 mm polyp at the splenic flexure, removed                            with a cold snare. Resected and retrieved.                           - Diverticulosis in the sigmoid colon and in the                            descending colon.                           - The examination was otherwise normal on direct                             and retroflexion views. I suspect bleeding from                            hemorrhoids. Moderate Sedation:      Moderate (conscious) sedation was personally administered by an       anesthesia professional. The following parameters were monitored: oxygen       saturation, heart rate, blood pressure, respiratory rate, EKG, adequacy       of pulmonary ventilation, and response to care. Total physician       intraservice time was 34 minutes. Recommendation:           - Patient has a contact number available for                            emergencies. The signs and symptoms of potential                            delayed complications were discussed with the                            patient. Return to normal activities tomorrow.                            Written discharge instructions were provided to the                            patient.                           -  Resume previous diet.                           - Continue present medications. Benefiber 1                            tablespoon twice daily. Anusol HC cream to the                            anorectum 3 times a day                           - Repeat colonoscopy date to be determined after                            pending pathology results are reviewed for                            surveillance based on pathology results.                           - Return to GI clinic in 3 months. See EGD report. Procedure Code(s):        --- Professional ---                           [phone], Colonoscopy, flexible; with removal of                            tumor(s), polyp(s), or other lesion(s) by snare                            technique Diagnosis Code(s):        --- Professional ---                           D12.3, Benign neoplasm of transverse colon (hepatic                            flexure or splenic flexure)                           K64.1, Second degree hemorrhoids                           K92.1, Melena (includes  Hematochezia)                           K57.30, Diverticulosis of large intestine without                            perforation or abscess without bleeding CPT copyright 2016 American Medical Association. All rights reserved. The codes documented in this report are preliminary and upon coder review may  be revised to meet current compliance requirements. Victoria Townsend. Victoria Raygoza, MD Norvel Richards, MD 01/29/2018 3:26:29 PM This report has been signed electronically. Number of Addenda: 0

## 2018-01-29 NOTE — Anesthesia Postprocedure Evaluation (Signed)
Anesthesia Post Note  Patient: Victoria Townsend  Procedure(s) Performed: ESOPHAGOGASTRODUODENOSCOPY (EGD) WITH PROPOFOL (N/A ) COLONOSCOPY WITH PROPOFOL (N/A ) MALONEY DILATION (N/A )  Patient location during evaluation: PACU Anesthesia Type: MAC Level of consciousness: awake and alert and oriented Pain management: pain level controlled Vital Signs Assessment: post-procedure vital signs reviewed and stable Respiratory status: spontaneous breathing Cardiovascular status: stable and blood pressure returned to baseline Postop Assessment: no apparent nausea or vomiting Anesthetic complications: no     Last Vitals:  Vitals:   01/29/18 1545 01/29/18 1556  BP: (!) 150/80 (!) 144/77  Pulse: 65 (!) 53  Resp: 10 14  Temp:  (!) 36.4 C  SpO2: 93% 99%    Last Pain:  Vitals:   01/29/18 1556  TempSrc: Oral  PainSc: 4                  Amica Harron

## 2018-01-29 NOTE — Anesthesia Preprocedure Evaluation (Signed)
Anesthesia Evaluation  Patient identified by MRN, date of birth, ID band Patient awake    Reviewed: Allergy & Precautions, NPO status , Patient's Chart, lab work & pertinent test results  Airway Mallampati: I  TM Distance: >3 FB Neck ROM: Full    Dental  (+) Teeth Intact   Pulmonary asthma , Current Smoker,    breath sounds clear to auscultation       Cardiovascular hypertension, Pt. on medications  Rhythm:Regular Rate:Normal     Neuro/Psych PSYCHIATRIC DISORDERS ( PTSD) Anxiety    GI/Hepatic GERD  Medicated and Controlled,(+)     substance abuse (in remission)  alcohol use, cocaine use and marijuana use,   Endo/Other    Renal/GU Renal disease     Musculoskeletal   Abdominal   Peds  Hematology   Anesthesia Other Findings   Reproductive/Obstetrics                             Anesthesia Physical Anesthesia Plan  ASA: III  Anesthesia Plan: MAC   Post-op Pain Management:    Induction: Intravenous  PONV Risk Score and Plan:   Airway Management Planned: Simple Face Mask  Additional Equipment:   Intra-op Plan:   Post-operative Plan:   Informed Consent: I have reviewed the patients History and Physical, chart, labs and discussed the procedure including the risks, benefits and alternatives for the proposed anesthesia with the patient or authorized representative who has indicated his/her understanding and acceptance.     Plan Discussed with:   Anesthesia Plan Comments:         Anesthesia Quick Evaluation

## 2018-01-29 NOTE — Op Note (Addendum)
[facility] Patient Name: Victoria Townsend Procedure Date: 01/29/2018 2:29 PM MRN: [id number] Date of Birth: 06-09-1961 Attending MD: Norvel Richards , MD CSN: [id number] Age: 57 Admit Type: Outpatient Procedure:                Upper GI endoscopy Indications:              Dysphagia Providers:                Norvel Richards, MD, Lurline Del, RN, Aram Candela Referring MD:              Medicines:                Propofol per Anesthesia Complications:            No immediate complications. Estimated Blood Loss:     Estimated blood loss: none. Procedure:                Pre-Anesthesia Assessment:                           - Prior to the procedure, a History and Physical                            was performed, and patient medications and                            allergies were reviewed. The patient's tolerance of                            previous anesthesia was also reviewed. The risks                            and benefits of the procedure and the sedation                            options and risks were discussed with the patient.                            All questions were answered, and informed consent                            was obtained. Prior Anticoagulants: The patient has                            taken no previous anticoagulant or antiplatelet                            agents. ASA Grade Assessment: II - A patient with                            mild systemic disease. After reviewing the risks  and benefits, the patient was deemed in                            satisfactory condition to undergo the procedure.                           After obtaining informed consent, the endoscope was                            passed under direct vision. Throughout the                            procedure, the patient's blood pressure, pulse, and                            oxygen saturations were monitored continuously.  The                            [id number]) scope was introduced through the                            and advanced to the second part of duodenum. The                            patient tolerated the procedure well. Scope In: 2:55:28 PM Scope Out: 3:02:36 PM Total Procedure Duration: 0 hours 7 minutes 8 seconds  Findings:      The examined esophagus was normal.      The entire examined stomach was normal.      The duodenal bulb and second portion of the duodenum were normal. The       scope was withdrawn. Dilation was performed with a Maloney dilator with       mild resistance at 56 Fr. The dilation site was examined following       endoscope reinsertion and showed no change. Estimated blood loss: none. Impression:               - Normal esophagus. Dilated.                           - Normal stomach.                           - Normal duodenal bulb and second portion of the                            duodenum.                           - No specimens collected. Moderate Sedation:      Moderate (conscious) sedation was personally administered by an       anesthesia professional. The following parameters were monitored: oxygen       saturation, heart rate, blood pressure, respiratory rate, EKG, adequacy       of pulmonary ventilation, and response to care. Total physician       intraservice time was 14 minutes. Recommendation:           -  Patient has a contact number available for                            emergencies. The signs and symptoms of potential                            delayed complications were discussed with the                            patient. Return to normal activities tomorrow.                            Written discharge instructions were provided to the                            patient.                           - Resume previous diet.                           - Continue present medications.                           - No repeat upper endoscopy.                            - Return to GI office in 3 months. See colonoscopy                            report. Procedure Code(s):        --- Professional ---                           [phone], Esophagogastroduodenoscopy, flexible,                            transoral; diagnostic, including collection of                            specimen(s) by brushing or washing, when performed                            (separate procedure)                           43450, Dilation of esophagus, by unguided sound or                            bougie, single or multiple passes Diagnosis Code(s):        --- Professional ---                           R13.10, Dysphagia, unspecified CPT copyright 2016 American Medical Association. All rights reserved. The codes documented in this report are preliminary and upon coder review may  be revised to meet current compliance requirements. Cristopher Estimable.  Nayra Coury, MD Norvel Richards, MD 01/29/2018 3:07:23 PM This report has been signed electronically. Number of Addenda: 0

## 2018-01-30 ENCOUNTER — Other Ambulatory Visit: Payer: Self-pay

## 2018-01-30 DIAGNOSIS — Z8639 Personal history of other endocrine, nutritional and metabolic disease: Secondary | ICD-10-CM

## 2018-01-30 MED ORDER — POTASSIUM CHLORIDE 20 MEQ PO PACK: 20.0000 meq | PACK | Freq: Two times a day (BID) | ORAL | Status: AC

## 2018-02-05 ENCOUNTER — Encounter: Payer: Self-pay | Admitting: Internal Medicine

## 2018-02-06 ENCOUNTER — Encounter (HOSPITAL_COMMUNITY): Payer: Self-pay | Admitting: Internal Medicine

## 2018-02-08 NOTE — Progress Notes (Signed)
She should f/u with PCP for any hypokalemia concerns. We generally just provide short term supplement.

## 2018-02-11 ENCOUNTER — Telehealth: Payer: Self-pay | Admitting: Internal Medicine

## 2018-02-11 ENCOUNTER — Other Ambulatory Visit: Payer: Self-pay

## 2018-02-11 NOTE — Telephone Encounter (Signed)
Colonoscopy almost 2 weeks ago. Reports reviewed.   I doubt a complication related to the procedure. She states abdominal pain predates colonoscopy.  Let's schedule back to see L SL -  next available.

## 2018-02-11 NOTE — Telephone Encounter (Signed)
Spoke with pt in reference to her Polyp removed, pt notified of results. Pt continues to have some soreness and aching in her abdomen s/p colonoscopy. Pt wants to know if that is a side affect s/p colonoscopy? Pt states she had pain in the middle of her abdomen before her procedure.

## 2018-02-11 NOTE — Telephone Encounter (Signed)
Pt notified and decline scheduling an appointment. Pt said she would call back if her pain got any worse.

## 2018-02-11 NOTE — Telephone Encounter (Signed)
Pt was calling for her results. [phone]

## 2018-02-11 NOTE — Telephone Encounter (Signed)
[phone]  Please call patient she questions about her procedure, wants to know the results

## 2018-02-11 NOTE — Telephone Encounter (Signed)
Noted. Documentation in another note.

## 2018-02-12 NOTE — Progress Notes (Signed)
Pt called back and would like a refill of Proctozone HC 2.5% cr sent into [facility]. Pt said she uses it for irritation on the buttock area.

## 2018-02-12 NOTE — Progress Notes (Signed)
Pt called stating [facility] didn't have her Potassium RX. Called [facility] and gave a second verbal Rx for Potassium.

## 2018-02-13 MED ORDER — HYDROCORTISONE 2.5 % EX OINT: 1.0000 "application " | TOPICAL_OINTMENT | Freq: Every day | CUTANEOUS | 2 refills | Status: DC | PRN

## 2018-02-13 NOTE — Addendum Note (Signed)
Addended by: Gordy Levan, Eithan Beagle A on: 02/13/2018 02:29 PM   Modules accepted: Orders

## 2018-02-20 ENCOUNTER — Ambulatory Visit: Payer: Self-pay | Admitting: Cardiology

## 2018-03-19 ENCOUNTER — Ambulatory Visit (INDEPENDENT_AMBULATORY_CARE_PROVIDER_SITE_OTHER): Payer: Medicaid Other

## 2018-03-19 ENCOUNTER — Ambulatory Visit (INDEPENDENT_AMBULATORY_CARE_PROVIDER_SITE_OTHER): Payer: Medicaid Other | Admitting: Podiatry

## 2018-03-19 ENCOUNTER — Encounter: Payer: Self-pay | Admitting: Podiatry

## 2018-03-19 DIAGNOSIS — G5762 Lesion of plantar nerve, left lower limb: Secondary | ICD-10-CM | POA: Diagnosis not present

## 2018-03-19 DIAGNOSIS — M79671 Pain in right foot: Secondary | ICD-10-CM

## 2018-03-19 DIAGNOSIS — M722 Plantar fascial fibromatosis: Secondary | ICD-10-CM

## 2018-03-19 DIAGNOSIS — M79672 Pain in left foot: Principal | ICD-10-CM

## 2018-03-19 NOTE — Progress Notes (Signed)
Subjective:  Patient ID: Victoria Townsend, female    DOB: 1961/07/24,  MRN: [id number]  Chief Complaint  Patient presents with  . Foot Pain    bilateral, esp left heel   57 y.o. female presents with the above complaint.  Reports pain in both feet.  Is worse to the left heel.  States that she thinks she has some pain in the inside of her arches. Present for several months.  Past Medical History:  Diagnosis Date  . Agoraphobia   . Anxiety disorder    Historically has seen a psychiatrist  . Asthma   . Bladder polyps   . Chest pain    Cardiolite 2009, no ischemia, EF 53%  . Essential hypertension, benign   . GERD (gastroesophageal reflux disease)   . Herpes   . Irregular heartbeat   . Nephrolithiasis   . Polysubstance abuse ([place])    History of cocaine and alcohol and marijuana; denies etoh and cocaine for 3 years.  Marland Kitchen PTSD (post-traumatic stress disorder)   . Shoulder pain    Shoulder has been injected   Past Surgical History:  Procedure Laterality Date  . APPENDECTOMY    . Bladder polyps    . CESAREAN SECTION WITH BILATERAL TUBAL LIGATION    . CHOLECYSTECTOMY    . COLONOSCOPY  May 2012   Dr. Posey Pronto: scattered diverticula pancolonic  . COLONOSCOPY WITH PROPOFOL N/A 01/29/2018   Procedure: COLONOSCOPY WITH PROPOFOL;  Surgeon: Daneil Dolin, MD;  Location: [facility];  Service: Endoscopy;  Laterality: N/A;  . ESOPHAGOGASTRODUODENOSCOPY  April 2012   Dr. Posey Pronto: gastritis  . ESOPHAGOGASTRODUODENOSCOPY N/A 08/09/2013   YOV:ZCHYIFO polyps/abnormal gastric because of uncertain significance-status post biopsy. Small hiatal PrismBlog.es chronic inflammation on bx. hyperplastic polyp  . ESOPHAGOGASTRODUODENOSCOPY (EGD) WITH PROPOFOL N/A 01/29/2018   Procedure: ESOPHAGOGASTRODUODENOSCOPY (EGD) WITH PROPOFOL;  Surgeon: Daneil Dolin, MD;  Location: [facility];  Service: Endoscopy;  Laterality: N/A;  1:30  . MALONEY DILATION N/A 01/29/2018   Procedure: Venia Minks DILATION;  Surgeon: Daneil Dolin, MD;  Location: [facility];  Service: Endoscopy;  Laterality: N/A;  . URETHRAL STRICTURE DILATATION    . WRIST FRACTURE SURGERY Right     Current Outpatient Medications:  .  acetaminophen (TYLENOL) 500 MG tablet, Take 1,000 mg by mouth every 8 (eight) hours as needed for moderate pain. , Disp: , Rfl:  .  albuterol (VENTOLIN HFA) 108 (90 BASE) MCG/ACT inhaler, Inhale 2 puffs into the lungs every 6 (six) hours as needed for shortness of breath. , Disp: , Rfl:  .  ALPRAZolam (XANAX) 1 MG tablet, Take 1 mg by mouth 4 (four) times daily. , Disp: , Rfl:  .  aspirin (ASPIR-LOW) 81 MG EC tablet, Take 81 mg by mouth daily as needed (for chest pain). , Disp: , Rfl:  .  clobetasol (TEMOVATE) 0.05 % cream, Apply 1 application topically daily as needed (for genital itching). , Disp: , Rfl:  .  gabapentin (NEURONTIN) 300 MG capsule, Take 300 mg by mouth 2 (two) times daily. May take a 3rd dose as needed for pain, Disp: , Rfl:  .  HYDROcodone-acetaminophen (NORCO/VICODIN) 5-325 MG tablet, Take 1 tablet by mouth 4 (four) times daily., Disp: , Rfl: 0 .  hydrocortisone 2.5 % ointment, Apply 1 application topically daily as needed (itching)., Disp: 30 g, Rfl: 2 .  lisinopril (PRINIVIL,ZESTRIL) 20 MG tablet, Take 20 mg by mouth daily., Disp: , Rfl: 5 .  oxybutynin (DITROPAN) 5 MG  tablet, Take 2.5 mg by mouth daily. , Disp: , Rfl: 3 .  pantoprazole (PROTONIX) 40 MG tablet, Take 600 mg by mouth 2 (two) times daily. , Disp: , Rfl:  .  potassium chloride SA (K-DUR,KLOR-CON) 20 MEQ tablet, Take 20 mEq by mouth 2 (two) times daily., Disp: , Rfl: 0 .  predniSONE (DELTASONE) 10 MG tablet, Take 10 mg by mouth daily as needed (lung pain). , Disp: , Rfl: 0 .  valACYclovir (VALTREX) 1000 MG tablet, Take 500 mg by mouth daily. , Disp: , Rfl:  .  verapamil (CALAN-SR) 240 MG CR tablet, Take 240 mg by mouth daily., Disp: , Rfl:   Allergies  Allergen Reactions  . Ciprofloxacin Swelling and Other (See Comments)     Facial swelling  . Codeine Nausea And Vomiting  . Ibuprofen Other (See Comments)    Oral cavity burning sensations  . Mucinex [Guaifenesin Er] Nausea And Vomiting  . Roxicodone [Oxycodone] Swelling    Mouth swelling  . Erythromycin Other (See Comments)    Facial swelling  . Shellfish Allergy Rash   Review of Systems: Negative except as noted in the HPI. Denies N/V/F/Ch. Objective:  There were no vitals filed for this visit. General AA&O x3. Normal mood and affect.  Vascular Dorsalis pedis and posterior tibial pulses  present 2+ bilaterally  Capillary refill normal to all digits. Pedal hair growth normal.  Neurologic Epicritic sensation grossly present.  Dermatologic No open lesions. Interspaces clear of maceration. Nails well groomed and normal in appearance.  Orthopedic: MMT 5/5 in dorsiflexion, plantarflexion, inversion, and eversion. Normal joint ROM without pain or crepitus. Pain to palpation about the medial arch bilateral with palpable fibroma Pain location to the left third interspace with positive Mulder's click   Assessment & Plan:  Patient was evaluated and treated and all questions answered.  Plantar Fibroma, bilat -Injections delivered both plantar arches  Procedure: Tendon Injection Location: Bilateral plantar medial arch / fibroma Skin Prep: Alcohol. Injectate: 0.5 cc 1% lidocaine plain, 0.5 cc dexamethasone phosphate. Disposition: Patient tolerated procedure well. Injection site dressed with a band-aid.  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  Procedure: Neuroma Injection Location: Left 3rd interspace Skin Prep: Alcohol. Injectate: 0.5 cc 0.5% marcaine plain, 0.5 cc dexamethasone phosphate. Disposition: Patient tolerated procedure well. Injection site dressed with a band-aid.  Return in about 6 weeks (around 04/30/2018) for fibroma, neuroma injection f/u.

## 2018-04-02 ENCOUNTER — Telehealth: Payer: Self-pay | Admitting: Internal Medicine

## 2018-04-02 NOTE — Telephone Encounter (Signed)
PT called back, she thought that she had missed a call from me. I told her Magda Paganini is seeing pt's and I will call when she sends me a message. She rates her pain at an 8 now, but the pain is intermittent. She said that she could go to the ED, but she is wanting [place] to tell them what to order. I told her I would let Magda Paganini know and I will call her back.

## 2018-04-02 NOTE — Telephone Encounter (Signed)
Reviewed her EGD/TCS, most recent labs and most recent OV note.   I would recommend ED evaluation IF she is having significant pain. The ED provider will make decision on necessary orders based on their evaluation.   If she is not going to ED, then would offer her OV here. We could order labs including CBC, LFTs to be done in interim.

## 2018-04-02 NOTE — Telephone Encounter (Signed)
[phone] please call patient, she is having bad pain since her procedure, sore and been suffering  since the procedure.  Please call

## 2018-04-02 NOTE — Telephone Encounter (Signed)
PT is aware. Said she will go to the ED and it will be tomorrow morning when her daughter can take her. She will call [phone] if she worsens and needs to go before then.

## 2018-04-02 NOTE — Telephone Encounter (Signed)
Pt had TCS and EGD 01/29/2018. She said she has felt soreness and burning and stinging on her right side right under her rib cage since the procedures. The problem is intermittent. She had diarrhea x 3 yesterday, today she has only had one small BM, not diarrhea. No nausea or vomiting.   I told her Dr. Gala Romney had wanted to get her back in after she called a week or so after the procedures but she wanted to wait. I offered her an appt with Neil Crouch, NP on 04/08/2018 since we had a cancellation. She said she did not feel that she could wait that long and she wants to have an [place] or [place] if possible.   Magda Paganini, please advise!

## 2018-04-22 ENCOUNTER — Other Ambulatory Visit: Payer: Self-pay | Admitting: Nurse Practitioner

## 2018-04-30 ENCOUNTER — Ambulatory Visit: Payer: Medicaid Other | Admitting: Podiatry

## 2018-05-05 ENCOUNTER — Ambulatory Visit: Payer: Medicaid Other | Admitting: Nurse Practitioner

## 2018-05-14 ENCOUNTER — Ambulatory Visit (INDEPENDENT_AMBULATORY_CARE_PROVIDER_SITE_OTHER): Payer: Medicaid Other | Admitting: Podiatry

## 2018-05-14 DIAGNOSIS — M722 Plantar fascial fibromatosis: Secondary | ICD-10-CM | POA: Diagnosis not present

## 2018-05-17 NOTE — Progress Notes (Signed)
  Subjective:  Patient ID: Victoria Townsend, female    DOB: 12-25-1960,  MRN: [id number]  No chief complaint on file.  57 y.o. female returns for the above complaint.  States the areas feel better after getting injection and that the injection may help shrink  Objective:  There were no vitals filed for this visit. General AA&O x3. Normal mood and affect.  Vascular Pedal pulses palpable.  Neurologic Epicritic sensation grossly intact.  Dermatologic No open lesions. Skin normal texture and turgor.  Orthopedic:  Pain to palpation about the plantar fibromas bilateral medial band plantar fascia   Assessment & Plan:  Patient was evaluated and treated and all questions answered.  Plantar fibromas bilateral -Reinjection performed as below should issues persist would consider possible excision  Procedure: Fibroma Injection Location: Bilateral plantar fibroma Skin Prep: Alcohol. Injectate: 0.5 cc 1% lidocaine plain, 0.5 cc dexamethasone phosphate. Disposition: Patient tolerated procedure well. Injection site dressed with a band-aid.  Return in about 6 weeks (around 06/25/2018) for Plantar Fibromas.Marland Kitchen

## 2018-06-25 ENCOUNTER — Ambulatory Visit: Payer: Medicaid Other | Admitting: Podiatry

## 2018-07-02 ENCOUNTER — Ambulatory Visit: Payer: Medicaid Other | Admitting: Podiatry

## 2018-07-29 ENCOUNTER — Ambulatory Visit: Payer: Medicaid Other | Admitting: Nurse Practitioner

## 2018-08-24 ENCOUNTER — Other Ambulatory Visit: Payer: Self-pay | Admitting: Gastroenterology

## 2018-10-23 ENCOUNTER — Other Ambulatory Visit: Payer: Self-pay | Admitting: Gastroenterology

## 2018-12-23 ENCOUNTER — Other Ambulatory Visit: Payer: Self-pay | Admitting: Gastroenterology

## 2019-02-24 ENCOUNTER — Other Ambulatory Visit: Payer: Self-pay | Admitting: Nurse Practitioner

## 2019-03-31 ENCOUNTER — Ambulatory Visit: Payer: Self-pay | Admitting: Cardiology

## 2019-04-12 ENCOUNTER — Telehealth: Payer: Self-pay

## 2019-04-12 NOTE — Telephone Encounter (Signed)
VM received. Pt is scheduled to see RMR on 04/13/19. She is cancelling her appointment on 04/13/19 due to transportation not being able to accommodate pts request for 04/13/19. Pt said that she will call back to reschedule when transportation is able to bring her. Appointment has been cancelled for 04/13/19.

## 2019-04-13 ENCOUNTER — Other Ambulatory Visit: Payer: Self-pay

## 2019-04-13 ENCOUNTER — Ambulatory Visit: Payer: Medicaid Other | Admitting: Internal Medicine

## 2019-04-13 ENCOUNTER — Ambulatory Visit (INDEPENDENT_AMBULATORY_CARE_PROVIDER_SITE_OTHER): Payer: Medicaid Other | Admitting: Internal Medicine

## 2019-04-13 ENCOUNTER — Encounter: Payer: Self-pay | Admitting: Internal Medicine

## 2019-04-13 DIAGNOSIS — R1013 Epigastric pain: Secondary | ICD-10-CM

## 2019-04-13 NOTE — Patient Instructions (Signed)
   Patient to be brought into the office for a face-to-face encounter for best practice in evaluating and managing her abdominal pain.

## 2019-04-13 NOTE — Progress Notes (Signed)
Referring Provider:  Primary Care Physician:  Sandi Mealy, MD  Primary GI:   Virtual Visit via Telephone Note Due to COVID-19, visit is conducted virtually and was requested by patient.   I connected with Victoria Townsend on 04/13/19 at 10:30 AM EDT by telephone and verified that I am speaking with the correct person using two identifiers.   I discussed the limitations, risks, security and privacy concerns of performing an evaluation and management service by telephone and the availability of in person appointments. I also discussed with the patient that there may be a patient responsible charge related to this service. The patient expressed understanding and agreed to proceed.  Chief Complaint  Patient presents with  . Abdominal Pain    ruq; feels like hard ball     History of Present Illness:  58 year old lady complains of burning, gnawing "flipping" pain in her upper stomach.  Had it for a couple of years.  Prior CT negative.  Prior EGD negative (done for dysphagia) 56 French Maloney dilator past 2019.  Some vague improvement in dysphagia.  Patient perseverates on wanting an MRI of her abdomen but to make sure she does not have "cancer".  States her sister was just diagnosed with cancer by MRI.  She tells me she is called her sister's doctor, Dr. Laural Golden ,and begged them to do an MRI but they refused to see her and no MRI has been ordered. States no weight loss no change in appetite no fever chills no melena rectal bleeding constipation or diarrhea.     Past Medical History:  Diagnosis Date  . Agoraphobia   . Anxiety disorder    Historically has seen a psychiatrist  . Asthma   . Bladder polyps   . Chest pain    Cardiolite 2009, no ischemia, EF 53%  . Essential hypertension, benign   . GERD (gastroesophageal reflux disease)   . Herpes   . Irregular heartbeat   . Nephrolithiasis   . Polysubstance abuse ([place])    History of cocaine and alcohol and marijuana; denies  etoh and cocaine for 3 years.  Marland Kitchen PTSD (post-traumatic stress disorder)   . Shoulder pain    Shoulder has been injected     Past Surgical History:  Procedure Laterality Date  . APPENDECTOMY    . Bladder polyps    . CESAREAN SECTION WITH BILATERAL TUBAL LIGATION    . CHOLECYSTECTOMY    . COLONOSCOPY  May 2012   Dr. Posey Pronto: scattered diverticula pancolonic  . COLONOSCOPY WITH PROPOFOL N/A 01/29/2018   Procedure: COLONOSCOPY WITH PROPOFOL;  Surgeon: Daneil Dolin, MD;  [facility];  Service: Endoscopy;  Laterality: N/A;  . ESOPHAGOGASTRODUODENOSCOPY  April 2012   Dr. Posey Pronto: gastritis  . ESOPHAGOGASTRODUODENOSCOPY N/A 08/09/2013   JOI:NOMVEHM polyps/abnormal gastric because of uncertain significance-status post biopsy. Small hiatal PrismBlog.es chronic inflammation on bx. hyperplastic polyp  . ESOPHAGOGASTRODUODENOSCOPY (EGD) WITH PROPOFOL N/A 01/29/2018   Procedure: ESOPHAGOGASTRODUODENOSCOPY (EGD) WITH PROPOFOL;  Surgeon: Daneil Dolin, MD;  [facility];  Service: Endoscopy;  Laterality: N/A;  1:30  . MALONEY DILATION N/A 01/29/2018   Procedure: Venia Minks DILATION;  Surgeon: Daneil Dolin, MD;  [facility];  Service: Endoscopy;  Laterality: N/A;  . URETHRAL STRICTURE DILATATION    . WRIST FRACTURE SURGERY Right      Current Meds  Medication Sig  . acetaminophen (TYLENOL) 500 MG tablet Take 1,000 mg by mouth every 8 (eight) hours as needed  for moderate pain.   Marland Kitchen albuterol (VENTOLIN HFA) 108 (90 BASE) MCG/ACT inhaler Inhale 2 puffs into the lungs every 6 (six) hours as needed for shortness of breath.   . ALPRAZolam (XANAX) 1 MG tablet Take 1 mg by mouth 4 (four) times daily.   Marland Kitchen aspirin (ASPIR-LOW) 81 MG EC tablet Take 81 mg by mouth daily as needed (for chest pain).   . clobetasol (TEMOVATE) 0.05 % cream Apply 1 application topically daily as needed (for genital itching).   . gabapentin (NEURONTIN) 300 MG capsule Take 300 mg by mouth 2 (two) times  daily. May take a 3rd dose as needed for pain  . hydrocortisone 2.5 % ointment APPLY ONE APPLICATION TOPICALLY DAILY AS NEEDED FOR ITCHING.  Marland Kitchen oxybutynin (DITROPAN) 5 MG tablet Take 2.5 mg by mouth as needed.   . pantoprazole (PROTONIX) 40 MG tablet Take 40 mg by mouth 2 (two) times daily.   . predniSONE (DELTASONE) 10 MG tablet Take 10 mg by mouth daily as needed (lung pain).   . valACYclovir (VALTREX) 1000 MG tablet Take 500 mg by mouth daily.   . verapamil (CALAN-SR) 240 MG CR tablet Take 240 mg by mouth daily.     Family History  Problem Relation Age of Onset  . Coronary artery disease Mother        CABG  . Colon cancer Paternal Uncle 107  . Stomach cancer Paternal Grandmother 25  . Stomach cancer Maternal Aunt 60  . Breast cancer Paternal Aunt   . Ovarian cancer Maternal Aunt     Social History   Socioeconomic History  . Marital status: Single    Spouse name: Not on file  . Number of children: Not on file  . Years of education: Not on file  . Highest education level: Not on file  Occupational History  . Not on file  Social Needs  . Financial resource strain: Not on file  . Food insecurity:    Worry: Not on file    Inability: Not on file  . Transportation needs:    Medical: Not on file    Non-medical: Not on file  Tobacco Use  . Smoking status: Current Every Day Smoker    Packs/day: 1.00    Years: 45.00    Pack years: 45.00    Types: Cigarettes  . Smokeless tobacco: Never Used  . Tobacco comment: 2-3 ciggs per day  Substance and Sexual Activity  . Alcohol use: No    Alcohol/week: 0.0 standard drinks    Comment: Former user, heavy etoh use prior to 1990s.  . Drug use: Yes    Types: Marijuana, Cocaine    Comment: 04/13/19-no cocaine; smokes marijuana occ  . Sexual activity: Not on file  Lifestyle  . Physical activity:    Days per week: Not on file    Minutes per session: Not on file  . Stress: Not on file  Relationships  . Social connections:    Talks on  phone: Not on file    Gets together: Not on file    Attends religious service: Not on file    Active member of club or organization: Not on file    Attends meetings of clubs or organizations: Not on file    Relationship status: Not on file  Other Topics Concern  . Not on file  Social History Narrative   ** Merged History Encounter **           Review of Systems: As in HPI  Observations/Objective: No distress. Unable to perform physical exam due to telephone encounter. No video available.   Assessment and Plan:   58 year old lady with ongoing issues of upper abdominal pain causing her quite a bit of anxiety and concern.  Chronic in nature.  She is insistent on getting an MRI of her abdomen to check for "cancer".  Issues are beyond the scope of virtual/telephonic visit.  Best practice would be to bring her in the office for a face-to-face encounter.  That will be the recommended plan.   Follow Up Instructions:  See above.      I discussed the assessment and treatment plan with the patient. The patient was provided an opportunity to ask questions and all were answered. The patient agreed with the plan and demonstrated an understanding of the instructions.   The patient was advised to call back or seek an in-person evaluation if the symptoms worsen or if the condition fails to improve as anticipated.  I provided 9 minutes of non-face-to-face time during this encounter.  Bridgette Habermann, MD [facility]

## 2019-04-14 ENCOUNTER — Encounter: Payer: Self-pay | Admitting: Internal Medicine

## 2019-04-23 ENCOUNTER — Other Ambulatory Visit: Payer: Self-pay | Admitting: Gastroenterology

## 2019-04-29 ENCOUNTER — Encounter: Payer: Self-pay | Admitting: Cardiology

## 2019-04-29 NOTE — Progress Notes (Signed)
Cardiology Office Note  Date: 04/30/2019   ID: Victoria Townsend, DOB 1960-11-30, MRN [id number]  PCP:  Sandi Mealy, MD  Consulting Cardiologist:  Rozann Lesches, MD Electrophysiologist:  None   Chief Complaint  Patient presents with  . Chest Pain    History of Present Illness: Victoria Townsend is a 58 y.o. female referred back to the office by Dr. Lorra Hals for evaluation of chest pain, limited records were provided.  She was last seen in 2016 and has a previous history of atypical chest pain with negative ischemic testing.  She states that over the last several months to greater than a year she has been having recurring episodes of left sided thoracic discomfort.  She describes a queasy sensation, dull aching, somewhat nauseated.  She does not report palpitations.  She cannot correlate any specific trigger.  She is also having some other cramping in her abdomen and neuropathic pain in her shoulders and legs.  She recently had a telehealth encounter with Dr. Gala Romney for GI evaluation due to upper abdominal discomfort.  Office follow-up pending for further discussion.  She seems to worry a lot about her symptoms, also concerned that she may develop health conditions that have been present in her family.  She does have some family history of heart disease.  I reviewed her medications. She is on aspirin and Lipitor. Also on verapamil for control of blood pressure.  Past Medical History:  Diagnosis Date  . Agoraphobia   . Anxiety disorder   . Asthma   . Bladder polyps   . Essential hypertension   . GERD (gastroesophageal reflux disease)   . Herpes   . History of chest pain    Negative ischemic work-up over time  . Irregular heartbeat   . Nephrolithiasis   . Polysubstance abuse ([place])    History of cocaine, alcohol, marijuana in the past  . PTSD (post-traumatic stress disorder)   . Shoulder pain    Shoulder has been injected    Past Surgical History:  Procedure Laterality  Date  . APPENDECTOMY    . Bladder polyps    . CESAREAN SECTION WITH BILATERAL TUBAL LIGATION    . CHOLECYSTECTOMY    . COLONOSCOPY  May 2012   Dr. Posey Pronto: scattered diverticula pancolonic  . COLONOSCOPY WITH PROPOFOL N/A 01/29/2018   Procedure: COLONOSCOPY WITH PROPOFOL;  Surgeon: Daneil Dolin, MD;  [facility];  Service: Endoscopy;  Laterality: N/A;  . ESOPHAGOGASTRODUODENOSCOPY  April 2012   Dr. Posey Pronto: gastritis  . ESOPHAGOGASTRODUODENOSCOPY N/A 08/09/2013   WUJ:WJXBJYN polyps/abnormal gastric because of uncertain significance-status post biopsy. Small hiatal PrismBlog.es chronic inflammation on bx. hyperplastic polyp  . ESOPHAGOGASTRODUODENOSCOPY (EGD) WITH PROPOFOL N/A 01/29/2018   Procedure: ESOPHAGOGASTRODUODENOSCOPY (EGD) WITH PROPOFOL;  Surgeon: Daneil Dolin, MD;  [facility];  Service: Endoscopy;  Laterality: N/A;  1:30  . MALONEY DILATION N/A 01/29/2018   Procedure: Venia Minks DILATION;  Surgeon: Daneil Dolin, MD;  [facility];  Service: Endoscopy;  Laterality: N/A;  . URETHRAL STRICTURE DILATATION    . WRIST FRACTURE SURGERY Right     Current Outpatient Medications  Medication Sig Dispense Refill  . albuterol (VENTOLIN HFA) 108 (90 BASE) MCG/ACT inhaler Inhale 2 puffs into the lungs every 6 (six) hours as needed for shortness of breath.     . ALPRAZolam (XANAX) 1 MG tablet Take 1 mg by mouth 4 (four) times daily.     Marland Kitchen aspirin (ASPIR-LOW) 81 MG  EC tablet Take 81 mg by mouth daily as needed (for chest pain).     Marland Kitchen atorvastatin (LIPITOR) 10 MG tablet Take 1 tablet by mouth at bedtime.    . clobetasol (TEMOVATE) 0.05 % cream Apply 1 application topically daily as needed (for genital itching).     . gabapentin (NEURONTIN) 300 MG capsule Take 300 mg by mouth 2 (two) times daily. May take a 3rd dose as needed for pain    . hydrocortisone 2.5 % ointment APPLY ONE APPLICATION TOPICALLY DAILY AS NEEDED FOR ITCHING. 28.35 g 0  . oxybutynin (DITROPAN) 5  MG tablet Take 2.5 mg by mouth as needed.   3  . pantoprazole (PROTONIX) 40 MG tablet Take 40 mg by mouth 2 (two) times daily.     . valACYclovir (VALTREX) 1000 MG tablet Take 500 mg by mouth daily.     . verapamil (CALAN-SR) 240 MG CR tablet Take 240 mg by mouth daily.    Marland Kitchen acetaminophen (TYLENOL) 500 MG tablet Take 1,000 mg by mouth every 8 (eight) hours as needed for moderate pain.     . metoprolol tartrate (LOPRESSOR) 50 MG tablet Take 1 tablet (50 mg total) by mouth as directed for 1 day. Take one hour before your cardiac ct 1 tablet 0   No current facility-administered medications for this visit.    Allergies:  Ciprofloxacin; Codeine; Ibuprofen; Mucinex [guaifenesin er]; Roxicodone [oxycodone]; Erythromycin; and Shellfish allergy   Social History: The patient  reports that she has been smoking cigarettes. She has a 45.00 pack-year smoking history. She has never used smokeless tobacco. She reports current drug use. Drugs: Marijuana and Cocaine. She reports that she does not drink alcohol.   Family History: The patient's family history includes Breast cancer in her paternal aunt; Colon cancer (age of onset: 73) in her paternal uncle; Coronary artery disease in her mother; Ovarian cancer in her maternal aunt; Stomach cancer (age of onset: 47) in her maternal aunt; Stomach cancer (age of onset: 33) in her paternal grandmother.   ROS:  Please see the history of present illness. Otherwise, complete review of systems is positive for paresthesias in her left leg, chronic lower back pain.  All other systems are reviewed and negative.   Physical Exam: VS:  BP (!) 151/83 (BP Location: Right Arm, Cuff Size: Normal)   Pulse (!) 59   Ht 4\' 10"  (1.473 m)   Wt 135 lb 6.4 oz (61.4 kg)   SpO2 100%   BMI 28.30 kg/m , BMI Body mass index is 28.3 kg/m.  Wt Readings from Last 3 Encounters:  04/30/19 135 lb 6.4 oz (61.4 kg)  01/29/18 141 lb (64 kg)  01/27/18 141 lb (64 kg)    General: Patient appears  comfortable at rest. HEENT: Conjunctiva and lids normal, oropharynx clear. Neck: Supple, no elevated JVP, left carotid bruit, no thyromegaly. Lungs: Decreased breath sounds without wheezing, nonlabored breathing at rest. Cardiac: Regular rate and rhythm, no S3, soft systolic murmur, no pericardial rub. Abdomen: Soft, nontender, bowel sounds present. Extremities: No pitting edema, distal pulses 2+. Skin: Warm and dry. Musculoskeletal: No kyphosis. Neuropsychiatric: Alert and oriented x3, affect grossly appropriate.  ECG:  An ECG dated 01/27/2018 was personally reviewed today and demonstrated:  Sinus rhythm with nonspecific ST changes.  Recent Labwork:  November 2019 AST 21, ALT 01 November 2018: BUN 10, creatinine 0.67, potassium 3.7  Other Studies Reviewed Today:  Exercise echocardiogram 10/01/2012 [facility]): No diagnostic ST segment changes at maximum workload of  7 METS, no chest pain.  No inducible wall motion abnormalities to indicate ischemia.  Abdominal CT 12/10/2017: FINDINGS: Lower chest: No acute findings.  Hepatobiliary: No hepatic masses identified. Prior cholecystectomy. No evidence of biliary obstruction.  Pancreas:  No mass or inflammatory changes.  Spleen:  Within normal limits in size and appearance.  Adrenals/Urinary Tract: No masses identified. 2 mm nonobstructing right renal calculus again seen. Stable tiny sub-cm left renal cyst. Mild scarring involving lower pole of right kidney. No evidence of hydronephrosis.  Stomach/Bowel: Diverticulosis seen involving visualized portion of transverse colon, however no evidence of diverticulitis in this region.  Vascular/Lymphatic: No pathologically enlarged lymph nodes identified. No abdominal aortic aneurysm. Aortic atherosclerosis.  Other:  None.  Musculoskeletal:  No suspicious bone lesions identified.  IMPRESSION: No acute findings.  Tiny nonobstructing right renal calculus.  Colonic  diverticulosis without signs of diverticulitis in visualized portion of colon.  Assessment and Plan:  1.  Recurrent chest pain with atypical features in a 58 year old woman with a family history of heart disease, ongoing tobacco abuse, hypertension, hyperlipidemia, and previous history of polysubstance abuse.  Last ischemic evaluation was in 2013 based on available records.  Plan is to proceed with a cardiac CTA for definition of coronary anatomy.  Shellfish allergy reported, but no contrast allergy.  She had a CT of the abdomen with contrast last year.  2.  Left carotid bruit, carotid Dopplers will be obtained.  Continue aspirin and statin.  3.  Essential hypertension by history, currently on verapamil with systolic in the 250I.  Keep follow-up with Dr. Lorra Hals.  4.  Mixed hyperlipidemia by history, reports compliance with Lipitor.  Medication Adjustments/Labs and Tests Ordered: Current medicines are reviewed at length with the patient today.  Concerns regarding medicines are outlined above.   Tests Ordered: Orders Placed This Encounter  Procedures  . CT CORONARY MORPH W/CTA COR W/SCORE W/CA W/CM &/OR WO/CM  . CT CORONARY FRACTIONAL FLOW RESERVE DATA PREP  . CT CORONARY FRACTIONAL FLOW RESERVE FLUID ANALYSIS  . Basic metabolic panel    Medication Changes: Meds ordered this encounter  Medications  . metoprolol tartrate (LOPRESSOR) 50 MG tablet    Sig: Take 1 tablet (50 mg total) by mouth as directed for 1 day. Take one hour before your cardiac ct    Dispense:  1 tablet    Refill:  0    Disposition:  Follow up test results.  Signed, Satira Sark, MD, [facility] 04/30/2019 11:19 AM    [facility] [address], [place], [place] [address] Phone: [phone]; Fax: [phone]

## 2019-04-30 ENCOUNTER — Encounter: Payer: Self-pay | Admitting: Cardiology

## 2019-04-30 ENCOUNTER — Ambulatory Visit (INDEPENDENT_AMBULATORY_CARE_PROVIDER_SITE_OTHER): Payer: Medicaid Other | Admitting: Cardiology

## 2019-04-30 VITALS — BP 151/83 | HR 59 | Ht <= 58 in | Wt 135.4 lb

## 2019-04-30 DIAGNOSIS — R0789 Other chest pain: Secondary | ICD-10-CM | POA: Diagnosis not present

## 2019-04-30 DIAGNOSIS — E782 Mixed hyperlipidemia: Secondary | ICD-10-CM | POA: Diagnosis not present

## 2019-04-30 DIAGNOSIS — R0989 Other specified symptoms and signs involving the circulatory and respiratory systems: Secondary | ICD-10-CM | POA: Diagnosis not present

## 2019-04-30 DIAGNOSIS — I10 Essential (primary) hypertension: Secondary | ICD-10-CM

## 2019-04-30 DIAGNOSIS — Z720 Tobacco use: Secondary | ICD-10-CM

## 2019-04-30 MED ORDER — METOPROLOL TARTRATE 50 MG PO TABS: 50.0000 mg | ORAL_TABLET | ORAL | 0 refills | Status: DC

## 2019-04-30 NOTE — Patient Instructions (Addendum)
Medication Instructions:   Your physician recommends that you continue on your current medications as directed. Please refer to the Current Medication list given to you today.  Labwork:  Your physician recommends that you return for lab work in: BMET to be done one week before your cardiac CT. Please have this done at [facility]. Your lab order has been given to you.   Testing/Procedures: Your physician has requested that you have cardiac CT. Cardiac computed tomography (CT) is a painless test that uses an x-ray machine to take clear, detailed pictures of your heart. For further information please visit [facility]tn. Please follow instruction sheet as given. Your physician has requested that you have a carotid duplex. This test is an ultrasound of the carotid arteries in your neck. It looks at blood flow through these arteries that supply the brain with blood. Allow one hour for this exam. There are no restrictions or special instructions.  Follow-Up:  Your physician recommends that you schedule a follow-up appointment in: pending test results.  Any Other Special Instructions Will Be Listed Below (If Applicable).  Please arrive at the [facility] main entrance of [facility] at  (30-45 minutes prior to test start time)  [facility] [address] [place], [place] [address] [phone]  Proceed to the [facility] Radiology Department (First Floor).  Please follow these instructions carefully (unless otherwise directed):  On the Night Before the Test: Drink plenty of water. Do not consume any caffeinated/decaffeinated beverages or chocolate 12 hours prior to your test. Do not take any antihistamines 12 hours prior to your test. If you take Metformin do not take 24 hours prior to test.   On the Day of the Test: Drink plenty of water. Do not drink any water within one hour of the test. Do not eat any food 4 hours prior to the test. You may take your  regular medications prior to the test. IF NOT ON A BETA BLOCKER - Take 50 mg of lopressor (metoprolol) one hour before the test.  After the Test: Drink plenty of water. After receiving IV contrast, you may experience a mild flushed feeling. This is normal. On occasion, you may experience a mild rash up to 24 hours after the test. This is not dangerous. If this occurs, you can take Benadryl 25 mg and increase your fluid intake. If you experience trouble breathing, this can be serious. If it is severe call 911 IMMEDIATELY. If it is mild, please call our office. If you take any of these medications: Glipizide/Metformin, Avandament, Glucavance, please do not take 48 hours after completing test.    If you need a refill on your cardiac medications before your next appointment, please call your pharmacy.

## 2019-05-05 ENCOUNTER — Telehealth: Payer: Self-pay | Admitting: Cardiology

## 2019-05-05 NOTE — Telephone Encounter (Signed)
Noted  

## 2019-05-05 NOTE — Telephone Encounter (Signed)
Left messsage to call and schedule ct

## 2019-05-18 ENCOUNTER — Encounter: Payer: Self-pay | Admitting: *Deleted

## 2019-05-19 ENCOUNTER — Telehealth (HOSPITAL_COMMUNITY): Payer: Self-pay | Admitting: Emergency Medicine

## 2019-05-19 ENCOUNTER — Telehealth: Payer: Self-pay | Admitting: *Deleted

## 2019-05-19 NOTE — Telephone Encounter (Signed)
Reaching out to patient to offer assistance regarding upcoming cardiac imaging study; pt verbalizes understanding of appt date/time, parking situation and where to check in, pre-test NPO status and medications ordered, and verified current allergies; name and call back number provided for further questions should they arise Marchia Bond RN Navigator Cardiac [facility] [place] [facility] [phone] office [phone] cell  Pts transport is [facility], verbalized understanding to take lopressor 2 hr prior to appt.

## 2019-05-19 NOTE — Telephone Encounter (Signed)
Patient informed. Copy sent to PCP °

## 2019-05-19 NOTE — Telephone Encounter (Signed)
-----   Message from Erma Heritage, [place] sent at 05/19/2019  4:07 PM EDT ----- Covering for Dr. Domenic Polite - Please let the patient know her renal function remains stable. Sodium is at the low-end of normal and similar to prior values. Continue with plans for Coronary CT.

## 2019-05-20 ENCOUNTER — Other Ambulatory Visit: Payer: Self-pay

## 2019-05-20 ENCOUNTER — Ambulatory Visit (HOSPITAL_COMMUNITY)
Admission: RE | Admit: 2019-05-20 | Discharge: 2019-05-20 | Disposition: A | Discharge status: Home / Self Care | Payer: Medicaid Other | Source: Ambulatory Visit | Attending: Cardiology | Admitting: Cardiology

## 2019-05-20 DIAGNOSIS — R0789 Other chest pain: Secondary | ICD-10-CM

## 2019-05-20 DIAGNOSIS — I2510 Atherosclerotic heart disease of native coronary artery without angina pectoris: Secondary | ICD-10-CM | POA: Diagnosis not present

## 2019-05-20 MED ORDER — IOHEXOL 350 MG/ML SOLN
90.0000 mL | Freq: Once | INTRAVENOUS | Status: AC | PRN
  Administered 2019-05-20: 90 mL via INTRAVENOUS

## 2019-05-20 MED ORDER — NITROGLYCERIN 0.4 MG SL SUBL
0.8000 mg | SUBLINGUAL_TABLET | Freq: Once | SUBLINGUAL | Status: AC
  Administered 2019-05-20: 15:00:00 0.8 mg via SUBLINGUAL

## 2019-05-20 MED ORDER — NITROGLYCERIN 0.4 MG SL SUBL
SUBLINGUAL_TABLET | SUBLINGUAL | Status: AC
  Filled 2019-05-20: qty 2

## 2019-05-20 NOTE — Progress Notes (Signed)
Ct complete. Patient denies any complaints. Offered snack and beverage.

## 2019-05-21 DIAGNOSIS — I2510 Atherosclerotic heart disease of native coronary artery without angina pectoris: Secondary | ICD-10-CM | POA: Diagnosis not present

## 2019-05-24 ENCOUNTER — Telehealth: Payer: Self-pay | Admitting: *Deleted

## 2019-05-24 MED ORDER — ISOSORBIDE MONONITRATE ER 30 MG PO TB24: 30.0000 mg | ORAL_TABLET | Freq: Every evening | ORAL | 3 refills | Status: DC

## 2019-05-24 NOTE — Telephone Encounter (Signed)
-----   Message from Satira Sark, MD sent at 05/23/2019  3:46 PM EDT ----- Results reviewed. FFR analysis indicates that although she does have moderate multivessel CAD, the potentially most hemodynamically significant stenosis is in the distal LAD in a small diameter segment. This would likely be managed medically at least at first. Importantly, the proximal vessel measurements did not suggest hemodynamic significance. Would start Imdur 30 mg QPM and continue remaining medications. Schedule follow-up with [place] in 4-6 weeks.

## 2019-05-24 NOTE — Telephone Encounter (Signed)
Patient informed and verbalized understanding of plan. 

## 2019-05-24 NOTE — Telephone Encounter (Signed)
-----   Message from Satira Sark, MD sent at 05/23/2019  3:42 PM EDT ----- Results reviewed. Overall moderate, multivessel CAD with potentially more significant distal stenoses. FFR analysis pending - will provide final discussion at that time.

## 2019-05-25 ENCOUNTER — Telehealth: Payer: Self-pay | Admitting: Cardiology

## 2019-05-25 NOTE — Telephone Encounter (Signed)
Noted.  If able to obtain low-dose Ranexa, that would also be a potential option.

## 2019-05-25 NOTE — Telephone Encounter (Signed)
Patient called stating that she cannot take the medication that was prescribed to her yesterday.  States she is having a terrible headache, back of her neck.

## 2019-05-25 NOTE — Telephone Encounter (Signed)
Reports having the worst headache of her life after taking first dose of imdur. Reports pain >10/10, stinging in neck, left eyeball pain and head feeling hot. Took imdur at 7:15 pm 05/24/19 and tylenol at 3:00 am 05/25/19 with no relief. Took tylenol 1000 mg 5 minutes before calling office this morning. Encouraged to try 15 mg imdur and she declined stating she rather not try it again. Advised to stop imdur, and if headache didn't improve, she needed to go to the ED for an evaluation. Verbalized understanding.

## 2019-05-26 MED ORDER — RANOLAZINE ER 500 MG PO TB12: 500.0000 mg | ORAL_TABLET | Freq: Two times a day (BID) | ORAL | 3 refills | Status: DC

## 2019-05-26 NOTE — Addendum Note (Signed)
Addended by: Merlene Laughter on: 05/26/2019 03:56 PM   Modules accepted: Orders

## 2019-05-26 NOTE — Telephone Encounter (Signed)
Patient informed and agrees to start ranexa.

## 2019-06-01 ENCOUNTER — Other Ambulatory Visit: Payer: Self-pay

## 2019-06-01 ENCOUNTER — Ambulatory Visit: Payer: Medicaid Other | Admitting: Gastroenterology

## 2019-06-01 ENCOUNTER — Encounter: Payer: Self-pay | Admitting: Gastroenterology

## 2019-06-01 ENCOUNTER — Telehealth: Payer: Self-pay | Admitting: Student

## 2019-06-01 VITALS — BP 133/78 | HR 62 | Temp 97.3°F | Ht <= 58 in | Wt 136.8 lb

## 2019-06-01 DIAGNOSIS — R1013 Epigastric pain: Secondary | ICD-10-CM | POA: Diagnosis not present

## 2019-06-01 DIAGNOSIS — R1011 Right upper quadrant pain: Secondary | ICD-10-CM | POA: Diagnosis not present

## 2019-06-01 DIAGNOSIS — R1010 Upper abdominal pain, unspecified: Secondary | ICD-10-CM

## 2019-06-01 NOTE — Progress Notes (Signed)
Referring Provider: Sandi Mealy, MD Primary Care Physician:  Sandi Mealy, MD  Primary GI: Dr. Gala Romney  Chief Complaint  Patient presents with  . Abdominal Pain    right/eft side, feels like "golf ball trying to flip around", going on x 4 yrs. Wants MRI to check for "cancer" but states she told RMR about this when he had a visit with him in May 11, 2023.    HPI:   Victoria Townsend is a 58 y.o. female with a history significant for chronic upper abdominal pain, GERD, CAD, HTN, and anxiety presenting today for further evaluation of her abdominal pain. She had telemedicine visit with Dr. Gala Romney in 05/11/23. Dr. Gala Romney felt issues were beyond the scope of a virtual visit and recommended coming in to the office. She was also recently seen by cardiology for atypical chest pain. Cardiology review of coronary CT FFR analysis: moderate multivessel CAD, potentially most hemodynamically significant stenosis in the distal LAD in a small diameter segment. Proximal vessels without hemodynamic significance. Now on Ranexa.   Today she states she is sore in her upper abdomen. Feels like golf ball in RUQ under her ribs trying to flip out when she bends over. Also with burning across upper abdomen. "Flipping" occurs about every other day. Burning across upper abdomen daily. When she feels the flipping, the burning will get worse. RUQ pain first started 8 years ago. States the burning moving across her upper abdomen just started over the last year. First she reports burning all day but later states burning comes and goes through out the day. Not associated with or worsened by eating. Burning not worsened with activity. No n/v, reflux symptoms, or dysphagia. No constipation. BMs daily. No hematochezia, or melena. No weight loss. Appetite remains good. No fever or chills. No NSAIDs other than aspirin daily. History of alcohol use prior to 1990's, none since. History of drug use when she was younger.   Denies lightheadedness,  dizziness, fatigue, chest pain, palpitations, shortness of breath, cough. Denies swelling in abdomen or in lower legs. Denies skin color changes or yellowing of eyes. Admits to stinging in pelvic area, history of herpes, is set up to see GYN.    History of cholecystectomy and appendectomy several years ago.   Patient is asking for an MRI repeatedly. She is very concerned about developing cancer as her sister just passed away in 05/11/19 from liver and lung cancer. She states this was found on an MRI. Also states she had called Dr. Laural Golden, who was her sisters doctor, asking for an MRI and they wouldn't order one.   Several family members with history of cancer. See family history below.    Prior Evaluation: CT abdomen and Pelvis with contrast 12/10/17: Without acute findings. No hepatic masses. No biliary obstruction. No pancreatic masses or infmallatory changes. Spleen normal.  EGD 01/29/2018: Normal exam. Esophagus empirically dilated.  Colonoscopy 01/29/2018: One 4 mm polyp in splenic flexure. Diverticulosis in sigmoid and descending colon. Internal hemorrhoids. Pathology demonstrated tubular adenoma. Repeat due in 2026.    Past Medical History:  Diagnosis Date  . Agoraphobia   . Anxiety disorder   . Asthma   . Bladder polyps   . Essential hypertension   . GERD (gastroesophageal reflux disease)   . Herpes   . History of chest pain    Negative ischemic work-up over time  . Irregular heartbeat   . Nephrolithiasis   . Polysubstance abuse ([place])    History of  cocaine, alcohol, marijuana in the past  . PTSD (post-traumatic stress disorder)   . Shoulder pain    Shoulder has been injected    Past Surgical History:  Procedure Laterality Date  . APPENDECTOMY    . Bladder polyps    . CESAREAN SECTION WITH BILATERAL TUBAL LIGATION    . CHOLECYSTECTOMY    . COLONOSCOPY  May 2012   Dr. Posey Pronto: scattered diverticula pancolonic  . COLONOSCOPY WITH PROPOFOL N/A 01/29/2018   Procedure: COLONOSCOPY  WITH PROPOFOL;  Surgeon: Daneil Dolin, MD;  [facility];  Service: Endoscopy;  Laterality: N/A;  . ESOPHAGOGASTRODUODENOSCOPY  April 2012   Dr. Posey Pronto: gastritis  . ESOPHAGOGASTRODUODENOSCOPY N/A 08/09/2013   [facility]:ENIDPOE polyps/abnormal gastric because of uncertain significance-status post biopsy. Small hiatal PrismBlog.es chronic inflammation on bx. hyperplastic polyp  . ESOPHAGOGASTRODUODENOSCOPY (EGD) WITH PROPOFOL N/A 01/29/2018   Procedure: ESOPHAGOGASTRODUODENOSCOPY (EGD) WITH PROPOFOL;  Surgeon: Daneil Dolin, MD;  Location: [facility];  Service: Endoscopy;  Laterality: N/A;  1:30  . MALONEY DILATION N/A 01/29/2018   Procedure: Venia Minks DILATION;  Surgeon: Daneil Dolin, MD;  Location: [facility];  Service: Endoscopy;  Laterality: N/A;  . URETHRAL STRICTURE DILATATION    . WRIST FRACTURE SURGERY Right     Current Outpatient Medications  Medication Sig Dispense Refill  . acetaminophen (TYLENOL) 500 MG tablet Take 1,000 mg by mouth every 8 (eight) hours as needed for moderate pain.     Marland Kitchen albuterol (VENTOLIN HFA) 108 (90 BASE) MCG/ACT inhaler Inhale 2 puffs into the lungs every 6 (six) hours as needed for shortness of breath.     . ALPRAZolam (XANAX) 1 MG tablet Take 1 mg by mouth 4 (four) times daily.     Marland Kitchen aspirin (ASPIR-LOW) 81 MG EC tablet Take 81 mg by mouth daily as needed (for chest pain).     Marland Kitchen atorvastatin (LIPITOR) 10 MG tablet Take 1 tablet by mouth at bedtime.    . clobetasol (TEMOVATE) 0.05 % cream Apply 1 application topically daily as needed (for genital itching).     . gabapentin (NEURONTIN) 300 MG capsule Take 300 mg by mouth 2 (two) times daily. May take a 3rd dose as needed for pain    . hydrocortisone 2.5 % ointment APPLY AS DIRECTED DAILY AS NEEDED FOR ITCHING. 28.35 g 2  . metoprolol tartrate (LOPRESSOR) 50 MG tablet Take 1 tablet (50 mg total) by mouth as directed for 1 day. Take one hour before your cardiac ct 1 tablet 0  . oxybutynin (DITROPAN) 5  MG tablet Take 2.5 mg by mouth as needed.   3  . pantoprazole (PROTONIX) 40 MG tablet Take 40 mg by mouth 2 (two) times daily.     . ranolazine (RANEXA) 500 MG 12 hr tablet Take 1 tablet (500 mg total) by mouth 2 (two) times daily. 60 tablet 3  . valACYclovir (VALTREX) 1000 MG tablet Take 500 mg by mouth daily.     . verapamil (CALAN-SR) 240 MG CR tablet Take 240 mg by mouth daily.     No current facility-administered medications for this visit.     Allergies as of 06/01/2019 - Review Complete 06/01/2019  Allergen Reaction Noted  . Ciprofloxacin Swelling and Other (See Comments) 04/12/2013  . Codeine Nausea And Vomiting   . Ibuprofen Other (See Comments)   . Mucinex [guaifenesin er] Nausea And Vomiting 01/21/2018  . Roxicodone [oxycodone] Swelling 01/21/2018  . Erythromycin Other (See Comments) 08/16/2014  . Shellfish allergy Rash  04/12/2013    Family History  Problem Relation Age of Onset  . Coronary artery disease Mother        CABG  . Colon cancer Paternal Uncle 91  . Stomach cancer Paternal Grandmother 24  . Stomach cancer Maternal Aunt 60  . Breast cancer Paternal Aunt   . Ovarian cancer Maternal Aunt   . Liver cancer Sister   . Lung cancer Sister     Social History   Socioeconomic History  . Marital status: Single    Spouse name: Not on file  . Number of children: Not on file  . Years of education: Not on file  . Highest education level: Not on file  Occupational History  . Not on file  Social Needs  . Financial resource strain: Not on file  . Food insecurity    Worry: Not on file    Inability: Not on file  . Transportation needs    Medical: Not on file    Non-medical: Not on file  Tobacco Use  . Smoking status: Current Every Day Smoker    Packs/day: 1.00    Years: 45.00    Pack years: 45.00    Types: Cigarettes  . Smokeless tobacco: Never Used  Substance and Sexual Activity  . Alcohol use: No    Alcohol/week: 0.0 standard drinks    Comment: Former  user, heavy etoh use prior to 1990s.  . Drug use: Not Currently    Types: Marijuana, Cocaine    Comment: 04/13/19-no cocaine; denies marijuana  . Sexual activity: Not on file  Lifestyle  . Physical activity    Days per week: Not on file    Minutes per session: Not on file  . Stress: Not on file  Relationships  . Social Herbalist on phone: Not on file    Gets together: Not on file    Attends religious service: Not on file    Active member of club or organization: Not on file    Attends meetings of clubs or organizations: Not on file    Relationship status: Not on file  Other Topics Concern  . Not on file  Social History Narrative   ** Merged History Encounter **        Review of Systems: Gen: See HPI CV: See HPI   Resp: See HPI  GI: See HPI Derm: Denies rash, itching, dry skin Psych: Denies depression. Admits to anxiety.  Heme: Denies bruising, bleeding    Physical Exam: BP 133/78   Pulse 62   Temp (!) 97.3 F (36.3 C) (Oral)   Ht 4\' 10"  (1.473 m)   Wt 136 lb 12.8 oz (62.1 kg)   BMI 28.59 kg/m  General:   Alert and oriented. No distress noted. Pleasant and cooperative.  Head:  Normocephalic and atraumatic. Eyes:  Conjuctiva clear without scleral icterus. Mouth:  Oral mucosa pink and moist. Good dentition. No lesions. Heart:  S1, S2 present without murmurs appreciated. Lungs:  Clear to auscultation bilaterally. No wheezes, rales, or rhonchi. No distress.  Abdomen:  +BS, soft, non-distended. Minimal tenderness accross upper abdomen. No HSM or masses appreciated. Large scar in RUQ from prior cholecystectomy/appendectomy. No tenderness to palpation in lower abdomen. No rebound or guarding.  Msk:  Symmetrical without gross deformities. Normal posture. Extremities:  Without edema. Neurologic:  Alert and  oriented x4 Psych:  Alert and cooperative. Normal mood and affect.

## 2019-06-01 NOTE — Telephone Encounter (Signed)

## 2019-06-01 NOTE — Patient Instructions (Signed)
1. Proceed with CT abdomen and pelvis at [facility]. I have also placed lab orders that you can have completed at the hospital as well.   We will have further recommendations following results. We can plan to see you back in the office in 2 months.   Aliene Altes, PA-C [facility]

## 2019-06-02 ENCOUNTER — Telehealth: Payer: Self-pay | Admitting: *Deleted

## 2019-06-02 ENCOUNTER — Ambulatory Visit (INDEPENDENT_AMBULATORY_CARE_PROVIDER_SITE_OTHER): Payer: Medicaid Other

## 2019-06-02 ENCOUNTER — Telehealth: Payer: Self-pay | Admitting: Cardiology

## 2019-06-02 ENCOUNTER — Telehealth: Payer: Self-pay

## 2019-06-02 ENCOUNTER — Encounter: Payer: Self-pay | Admitting: Internal Medicine

## 2019-06-02 DIAGNOSIS — R0989 Other specified symptoms and signs involving the circulatory and respiratory systems: Secondary | ICD-10-CM | POA: Diagnosis not present

## 2019-06-02 NOTE — Telephone Encounter (Signed)
Advised to contact her GI doctor about this issue.

## 2019-06-02 NOTE — Telephone Encounter (Signed)
Patient rescheduled her test and was told that she would need to pick up two bottles of stuff to drink.  She said that she does not have transportation to pick it up.

## 2019-06-02 NOTE — Telephone Encounter (Signed)
Patient informed. Copy sent to PCP °

## 2019-06-02 NOTE — Telephone Encounter (Signed)
-----   Message from Satira Sark, MD sent at 06/02/2019 11:44 AM EDT ----- Results reviewed.  Only mild bilateral ICA stenoses.  Continue with medical therapy for now.

## 2019-06-02 NOTE — Telephone Encounter (Signed)
CT abd/pelvis w/contrast scheduled for 06/14/19 at 1:00pm, arrive at 12:45pm. Pick up contrast before day of test, NPO 4 hours prior to test. Called and informed pt of appt, however, pt wants to reschedule test to later date. Gave her phone number to central scheduling to reschedule appt to more convenient date.

## 2019-06-04 NOTE — Assessment & Plan Note (Addendum)
58 y.o. female with history of chronic upper abdominal pain starting about 8 years ago. States it feels like a "golf ball" in RUQ under her ribs trying to "flip out" when she bends over. Now with burning extending across her upper abdomen over the last year that is present daily. Not associated with meals or activity. No other upper or lower GI symptoms. No alarm signs/symptoms. No NSAIDs other than 81 mg aspirin. No alcohol or drug use in many years. Exam with minimal tenderness to palpation across upper abdomen. No masses or HSM appreciated. CT abdomen and pelvis Jan 2019 negative. EGD March 2019 normal exam, empiric dilation. Colonoscopy March 2019 with tubular adenoma and diverticulosis. Repeat in 2026.  Recent BMP unremarkable other than low normal sodium. Several family members with history of cancer.   I do not suspect acute etiology. With history of abdomineal cholecystectomy/appendectomy with large scar in RUQ, it is possible scar tissue is playing a role. Can't exclude liver nodule or malignancy. Of note CT abdomen and pelvis in 2014 with focal fat/altered perfusion along falciform ligament. Doubt gastritis or PUD as recent EGD was normal and patient is taking Protonix 40 mg BID. Possible, but less likely CAD playing a role in epigastric burning as symptoms are not worsened or brought on with activity. Anxiety may also be playing a role as patient is very concerned about cancer since her sisters passing in May 2020.   Although upper abdominal pain is chronic, patient is endorsing change in symptoms, so will pursue further workup at this time. Explained that I could not order an MRI, but could get a CT. Patient was agreeable.  CT abdomen and pelvis with contrast.  HFP, CBC, and Lipase.  Due to lack of transportation and having to ride [employer], patient will have to schedule the CT for a later date and have labs completed when she has the CT.  Patient has follow-up with cardiology scheduled on 06/22/19.     Follow-up in our office in 2 months.

## 2019-06-05 ENCOUNTER — Encounter: Payer: Self-pay | Admitting: Gastroenterology

## 2019-06-07 NOTE — Progress Notes (Signed)
CC'D TO PCP °

## 2019-06-08 NOTE — Telephone Encounter (Signed)
Pt rescheduled CT to 07/05/19.  PA for CT submitted via [employer] website. Case pending clinical review. Service order: [id number]. Clinical notes uploaded and faxed to [facility].

## 2019-06-14 ENCOUNTER — Ambulatory Visit (HOSPITAL_COMMUNITY): Payer: Medicaid Other

## 2019-06-14 NOTE — Telephone Encounter (Signed)
CT approved. PA# [id number], valid 06/08/19-12/05/19.

## 2019-06-15 ENCOUNTER — Telehealth: Payer: Self-pay | Admitting: Student

## 2019-06-15 NOTE — Telephone Encounter (Signed)
Message   Patient would like you to call Dr Sydell Axon office and have them order her a MRI , she is having abdominal pain and doesn't want to have another CT that is not going to show anything, she feels like she has a cat rolling around in her stomach

## 2019-06-15 NOTE — Telephone Encounter (Signed)
Spoke with regarding calling her PCP about this matter. Pt voiced understanding

## 2019-06-18 ENCOUNTER — Other Ambulatory Visit (HOSPITAL_COMMUNITY)
Admission: RE | Admit: 2019-06-18 | Discharge: 2019-06-18 | Disposition: A | Discharge status: Home / Self Care | Payer: Medicaid Other | Source: Ambulatory Visit | Attending: Gastroenterology | Admitting: Gastroenterology

## 2019-06-18 DIAGNOSIS — R1010 Upper abdominal pain, unspecified: Secondary | ICD-10-CM | POA: Diagnosis present

## 2019-06-18 LAB — CBC WITH DIFFERENTIAL/PLATELET
Abs Immature Granulocytes: 0.02 10*3/uL (ref 0.00–0.07)
Basophils Absolute: 0 10*3/uL (ref 0.0–0.1)
Basophils Relative: 0 %
Eosinophils Absolute: 0.2 10*3/uL (ref 0.0–0.5)
Eosinophils Relative: 2 %
HCT: 40.4 % (ref 36.0–46.0)
Hemoglobin: 13.1 g/dL (ref 12.0–15.0)
Immature Granulocytes: 0 %
Lymphocytes Relative: 42 %
Lymphs Abs: 2.9 10*3/uL (ref 0.7–4.0)
MCH: 31.3 pg (ref 26.0–34.0)
MCHC: 32.4 g/dL (ref 30.0–36.0)
MCV: 96.4 fL (ref 80.0–100.0)
Monocytes Absolute: 0.5 10*3/uL (ref 0.1–1.0)
Monocytes Relative: 6 %
Neutro Abs: 3.5 10*3/uL (ref 1.7–7.7)
Neutrophils Relative %: 50 %
Platelets: 251 10*3/uL (ref 150–400)
RBC: 4.19 MIL/uL (ref 3.87–5.11)
RDW: 12.3 % (ref 11.5–15.5)
WBC: 7 10*3/uL (ref 4.0–10.5)
nRBC: 0 % (ref 0.0–0.2)

## 2019-06-18 LAB — HEPATIC FUNCTION PANEL
ALT: 14 U/L (ref 0–44)
AST: 27 U/L (ref 15–41)
Albumin: 4.3 g/dL (ref 3.5–5.0)
Alkaline Phosphatase: 61 U/L (ref 38–126)
Bilirubin, Direct: 0.3 mg/dL — ABNORMAL HIGH (ref 0.0–0.2)
Indirect Bilirubin: 1 mg/dL — ABNORMAL HIGH (ref 0.3–0.9)
Total Bilirubin: 1.3 mg/dL — ABNORMAL HIGH (ref 0.3–1.2)
Total Protein: 7.6 g/dL (ref 6.5–8.1)

## 2019-06-18 LAB — LIPASE, BLOOD: Lipase: 41 U/L (ref 11–51)

## 2019-06-21 ENCOUNTER — Telehealth: Payer: Self-pay | Admitting: Student

## 2019-06-21 NOTE — Telephone Encounter (Signed)
Virtual Visit Pre-Appointment Phone Call  "(Name), I am calling you today to discuss your upcoming appointment. We are currently trying to limit exposure to the virus that causes COVID-19 by seeing patients at home rather than in the office."  1. "What is the BEST phone number to call the day of the visit?" - include this in appointment notes  2. Do you have or have access to (through a family member/friend) a smartphone with video capability that we can use for your visit?" a. If yes - list this number in appt notes as cell (if different from BEST phone #) and list the appointment type as a VIDEO visit in appointment notes b. If no - list the appointment type as a PHONE visit in appointment notes  3. Confirm consent - "In the setting of the current Covid19 crisis, you are scheduled for a (phone or video) visit with your provider on (date) at (time).  Just as we do with many in-office visits, in order for you to participate in this visit, we must obtain consent.  If you'd like, I can send this to your mychart (if signed up) or email for you to review.  Otherwise, I can obtain your verbal consent now.  All virtual visits are billed to your insurance company just like a normal visit would be.  By agreeing to a virtual visit, we'd like you to understand that the technology does not allow for your provider to perform an examination, and thus may limit your provider's ability to fully assess your condition. If your provider identifies any concerns that need to be evaluated in person, we will make arrangements to do so.  Finally, though the technology is pretty good, we cannot assure that it will always work on either your or our end, and in the setting of a video visit, we may have to convert it to a phone-only visit.  In either situation, we cannot ensure that we have a secure connection.  Are you willing to proceed?" STAFF: Did the patient verbally acknowledge consent to telehealth visit? Document  YES/NO here: yes  4. Advise patient to be prepared - "Two hours prior to your appointment, go ahead and check your blood pressure, pulse, oxygen saturation, and your weight (if you have the equipment to check those) and write them all down. When your visit starts, your provider will ask you for this information. If you have an Apple Watch or Kardia device, please plan to have heart rate information ready on the day of your appointment. Please have a pen and paper handy nearby the day of the visit as well."  5. Give patient instructions for MyChart download to smartphone OR Doximity/Doxy.me as below if video visit (depending on what platform provider is using)  6. Inform patient they will receive a phone call 15 minutes prior to their appointment time (may be from unknown caller ID) so they should be prepared to answer    TELEPHONE CALL NOTE  SOLYANA NONAKA has been deemed a candidate for a follow-up tele-health visit to limit community exposure during the Covid-19 pandemic. I spoke with the patient via phone to ensure availability of phone/video source, confirm preferred email & phone number, and discuss instructions and expectations.  I reminded Victoria Townsend to be prepared with any vital sign and/or heart rhythm information that could potentially be obtained via home monitoring, at the time of her visit. I reminded Victoria Townsend to expect a phone call prior to  her visit.  Howie Ill 06/21/2019 4:25 PM   INSTRUCTIONS FOR DOWNLOADING THE MYCHART APP TO SMARTPHONE  - The patient must first make sure to have activated MyChart and know their login information - If Apple, go to [employer] and type in MyChart in the search bar and download the app. If Android, ask patient to go to [employer] and type in [place] in the search bar and download the app. The app is free but as with any other app downloads, their phone may require them to verify saved payment information or Apple/Android  password.  - The patient will need to then log into the app with their MyChart username and password, and select [facility] as their healthcare provider to link the account. When it is time for your visit, go to the MyChart app, find appointments, and click Begin Video Visit. Be sure to Select Allow for your device to access the Microphone and Camera for your visit. You will then be connected, and your provider will be with you shortly.  **If they have any issues connecting, or need assistance please contact MyChart service desk [phone])**  **If using a computer, in order to ensure the best quality for their visit they will need to use either of the following Internet Browsers: [employer], or Google Chrome**  IF USING DOXIMITY or DOXY.ME - The patient will receive a link just prior to their visit by text.     FULL LENGTH CONSENT FOR TELE-HEALTH VISIT   I hereby voluntarily request, consent and authorize [facility] and its employed or contracted physicians, physician assistants, nurse practitioners or other licensed health care professionals (the Practitioner), to provide me with telemedicine health care services (the Services") as deemed necessary by the treating Practitioner. I acknowledge and consent to receive the Services by the Practitioner via telemedicine. I understand that the telemedicine visit will involve communicating with the Practitioner through live audiovisual communication technology and the disclosure of certain medical information by electronic transmission. I acknowledge that I have been given the opportunity to request an in-person assessment or other available alternative prior to the telemedicine visit and am voluntarily participating in the telemedicine visit.  I understand that I have the right to withhold or withdraw my consent to the use of telemedicine in the course of my care at any time, without affecting my right to future care or treatment,  and that the Practitioner or I may terminate the telemedicine visit at any time. I understand that I have the right to inspect all information obtained and/or recorded in the course of the telemedicine visit and may receive copies of available information for a reasonable fee.  I understand that some of the potential risks of receiving the Services via telemedicine include:   Delay or interruption in medical evaluation due to technological equipment failure or disruption;  Information transmitted may not be sufficient (e.g. poor resolution of images) to allow for appropriate medical decision making by the Practitioner; and/or   In rare instances, security protocols could fail, causing a breach of personal health information.  Furthermore, I acknowledge that it is my responsibility to provide information about my medical history, conditions and care that is complete and accurate to the best of my ability. I acknowledge that Practitioner's advice, recommendations, and/or decision may be based on factors not within their control, such as incomplete or inaccurate data provided by me or distortions of diagnostic images or specimens that may result from electronic transmissions. I  understand that the practice of medicine is not an exact science and that Practitioner makes no warranties or guarantees regarding treatment outcomes. I acknowledge that I will receive a copy of this consent concurrently upon execution via email to the email address I last provided but may also request a printed copy by calling the office of [facility].    I understand that my insurance will be billed for this visit.   I have read or had this consent read to me.  I understand the contents of this consent, which adequately explains the benefits and risks of the Services being provided via telemedicine.   I have been provided ample opportunity to ask questions regarding this consent and the Services and have had my questions  answered to my satisfaction.  I give my informed consent for the services to be provided through the use of telemedicine in my medical care  By participating in this telemedicine visit I agree to the above.

## 2019-06-22 ENCOUNTER — Encounter: Payer: Self-pay | Admitting: Student

## 2019-06-22 ENCOUNTER — Telehealth (INDEPENDENT_AMBULATORY_CARE_PROVIDER_SITE_OTHER): Payer: Medicaid Other | Admitting: Student

## 2019-06-22 ENCOUNTER — Encounter: Payer: Self-pay | Admitting: *Deleted

## 2019-06-22 VITALS — Ht <= 58 in

## 2019-06-22 DIAGNOSIS — E785 Hyperlipidemia, unspecified: Secondary | ICD-10-CM

## 2019-06-22 DIAGNOSIS — Z720 Tobacco use: Secondary | ICD-10-CM

## 2019-06-22 DIAGNOSIS — I2510 Atherosclerotic heart disease of native coronary artery without angina pectoris: Secondary | ICD-10-CM

## 2019-06-22 DIAGNOSIS — I10 Essential (primary) hypertension: Secondary | ICD-10-CM

## 2019-06-22 DIAGNOSIS — R1013 Epigastric pain: Secondary | ICD-10-CM

## 2019-06-22 NOTE — Patient Instructions (Signed)
Medication Instructions:  Your physician recommends that you continue on your current medications as directed. Please refer to the Current Medication list given to you today.   Labwork: I will request lab results from your PCP   Testing/Procedures: NONE   Follow-Up: Your physician recommends that you schedule a follow-up appointment in: 3 Months with Dr. Domenic Polite in the [place] office.   Any Other Special Instructions Will Be Listed Below (If Applicable).     If you need a refill on your cardiac medications before your next appointment, please call your pharmacy.  Thank you for choosing [facility]!

## 2019-06-22 NOTE — Progress Notes (Signed)
Virtual Visit via Telephone Note   This visit type was conducted due to national recommendations for restrictions regarding the COVID-19 Pandemic (e.g. social distancing) in an effort to limit this patient's exposure and mitigate transmission in our community.  Due to her co-morbid illnesses, this patient is at least at moderate risk for complications without adequate follow up.  This format is felt to be most appropriate for this patient at this time.  The patient did not have access to video technology/had technical difficulties with video requiring transitioning to audio format only (telephone).  All issues noted in this document were discussed and addressed.  No physical exam could be performed with this format.  Please refer to the patient's chart for her  consent to telehealth for [facility].   Date:  06/22/2019   ID:  Victoria Townsend, DOB 01-03-1961, MRN [id number]  Patient Location: Home Provider Location: Office  PCP:  Sandi Mealy, MD  Cardiologist:  Rozann Lesches, MD  Electrophysiologist:  None   Evaluation Performed:  Follow-Up Visit  Chief Complaint:  Follow-Up from Coronary CT  History of Present Illness:    Victoria Townsend is a 58 y.o. female with past medical history of HTN, HLD, anxiety, PTSD, and tobacco use who presents for a telehealth follow-up visit in regards to Coronary CT results.  She was last examined by Dr. Domenic Polite in 04/2019 and reported episodes of left-sided thoracic discomfort for the past year which could occur at rest or with activity. Given her cardiac risk factors, a Coronary CT was recommended for further evaluation along with Carotid Dopplers given that she had a bruit on examination. Carotid Doppler showed 1 to 39% stenosis bilaterally. Coronary CT was performed on 05/20/2019 and showed severe CAD in the distal RCA and proximal-distal circumflex artery which was sent for FFR. FFR demonstrated a possible hemodynamically significant lesion in the  distal LAD with aggressive medical management recommended. She was started on Imdur 30mg  daily and follow-up was arranged. She called the office in the interim reporting headaches with Imdur, therefore this was discontinued and she was started on Ranexa 500mg  BID.   In talking with the patient today, she reports overall being cautious during COVID-19 and has been wearing a mask when out and about. She reports intermittent episodes of pain all over her body. Has constant rotator cuff pain since her surgery earlier this year. Also reports occasional shooting pains along her neck bilaterally. No headaches or vision changes. She experiences epigastric pain and reports having a hard mass which feels like a golf ball along this area. Has been evaluated by GI and is scheduled for a CT next month. Also notes pain along her breasts bilaterally. Typically worse with deep breathing. No association with exertion. Has been able to climb stairs in her home without symptoms. Scheduled for a mammogram in the coming weeks as well.   She denies any recent palpitations, orthopnea, PND, or lower extremity edema. Feels like her breathing improved with the addition of Ranexa.   She has been under increased stress as 3 family members have passed away this year. She has been smoking 0.5 ppd and says her stress plays a role in the number of cigarettes she smokes each day.   The patient does not have symptoms concerning for COVID-19 infection (fever, chills, cough, or new shortness of breath).    Past Medical History:  Diagnosis Date  . Agoraphobia   . Anxiety disorder   . Asthma   .  Bladder polyps   . CAD (coronary artery disease)   . Essential hypertension   . GERD (gastroesophageal reflux disease)   . Herpes   . History of chest pain    Negative ischemic work-up over time  . Irregular heartbeat   . Nephrolithiasis   . Polysubstance abuse ([place])    History of cocaine, alcohol, marijuana in the past  . PTSD  (post-traumatic stress disorder)   . Shoulder pain    Shoulder has been injected   Past Surgical History:  Procedure Laterality Date  . APPENDECTOMY    . Bladder polyps    . CESAREAN SECTION WITH BILATERAL TUBAL LIGATION    . CHOLECYSTECTOMY    . COLONOSCOPY  May 2012   Dr. Posey Pronto: scattered diverticula pancolonic  . COLONOSCOPY WITH PROPOFOL N/A 01/29/2018   Procedure: COLONOSCOPY WITH PROPOFOL;  Surgeon: Daneil Dolin, MD;  [facility];  Service: Endoscopy;  Laterality: N/A;  . ESOPHAGOGASTRODUODENOSCOPY  April 2012   Dr. Posey Pronto: gastritis  . ESOPHAGOGASTRODUODENOSCOPY N/A 08/09/2013   IZT:[id number] polyps/abnormal gastric because of uncertain significance-status post biopsy. Small hiatal PrismBlog.es chronic inflammation on bx. hyperplastic polyp  . ESOPHAGOGASTRODUODENOSCOPY (EGD) WITH PROPOFOL N/A 01/29/2018   Procedure: ESOPHAGOGASTRODUODENOSCOPY (EGD) WITH PROPOFOL;  Surgeon: Daneil Dolin, MD;  [facility]: [facility];  Service: Endoscopy;  Laterality: N/A;  1:30  . MALONEY DILATION N/A 01/29/2018   Procedure: Venia Minks DILATION;  Surgeon: Daneil Dolin, MD;  Location: [facility];  Service: Endoscopy;  Laterality: N/A;  . URETHRAL STRICTURE DILATATION    . WRIST FRACTURE SURGERY Right      Current Meds  Medication Sig  . acetaminophen (TYLENOL) 500 MG tablet Take 1,000 mg by mouth every 8 (eight) hours as needed for moderate pain.   Marland Kitchen albuterol (VENTOLIN HFA) 108 (90 BASE) MCG/ACT inhaler Inhale 2 puffs into the lungs every 6 (six) hours as needed for shortness of breath.   . ALPRAZolam (XANAX) 1 MG tablet Take 1 mg by mouth 4 (four) times daily.   Marland Kitchen aspirin (ASPIR-LOW) 81 MG EC tablet Take 81 mg by mouth daily as needed (for chest pain).   Marland Kitchen atorvastatin (LIPITOR) 10 MG tablet Take 1 tablet by mouth at bedtime.  . clobetasol (TEMOVATE) 0.05 % cream Apply 1 application topically daily as needed (for genital itching).   . gabapentin (NEURONTIN) 300 MG capsule Take 300  mg by mouth 2 (two) times daily. May take a 3rd dose as needed for pain  . hydrocortisone 2.5 % ointment APPLY AS DIRECTED DAILY AS NEEDED FOR ITCHING.  Marland Kitchen oxybutynin (DITROPAN) 5 MG tablet Take 2.5 mg by mouth as needed.   . pantoprazole (PROTONIX) 40 MG tablet Take 40 mg by mouth 2 (two) times daily.   . ranolazine (RANEXA) 500 MG 12 hr tablet Take 1 tablet (500 mg total) by mouth 2 (two) times daily.  . valACYclovir (VALTREX) 1000 MG tablet Take 500 mg by mouth daily.   . verapamil (CALAN-SR) 240 MG CR tablet Take 240 mg by mouth daily.  . [DISCONTINUED] metoprolol tartrate (LOPRESSOR) 50 MG tablet Take 1 tablet (50 mg total) by mouth as directed for 1 day. Take one hour before your cardiac ct     Allergies:   Ciprofloxacin, Codeine, Ibuprofen, Imdur [isosorbide nitrate], Mucinex [guaifenesin er], Roxicodone [oxycodone], Erythromycin, and Shellfish allergy   Social History   Tobacco Use  . Smoking status: Current Every Day Smoker    Packs/day: 1.00    Years: 45.00  Pack years: 45.00    Types: Cigarettes  . Smokeless tobacco: Never Used  Substance Use Topics  . Alcohol use: No    Alcohol/week: 0.0 standard drinks    Comment: Former user, heavy etoh use prior to 1990s.  . Drug use: Not Currently    Types: Marijuana, Cocaine    Comment: 04/13/19-no cocaine; denies marijuana     Family Hx: The patient's family history includes Breast cancer in her paternal aunt; Colon cancer (age of onset: 51) in her paternal uncle; Coronary artery disease in her mother; Liver cancer in her sister; Lung cancer in her sister; Ovarian cancer in her maternal aunt; Stomach cancer (age of onset: 1) in her maternal aunt; Stomach cancer (age of onset: 52) in her paternal grandmother.  ROS:   Please see the history of present illness.     All other systems reviewed and are negative.   Prior CV studies:   The following studies were reviewed today:  Carotid Dopplers: 06/02/2019 Summary: Right  Carotid: Velocities in the right ICA are consistent with a 1-39% stenosis.  Left Carotid: Velocities in the left ICA are consistent with a 1-39% stenosis.  Vertebrals:  Bilateral vertebral arteries demonstrate antegrade flow. Subclavians: Normal flow hemodynamics were seen in bilateral subclavian              arteries.  Coronary CT: 05/20/2019 IMPRESSION: 1. Severe CAD in the distal RCA and proximal-distal circumflex artery, CADRADS = 4. CT FFR analysis will be performed and reported separately.  2. The patient's coronary artery calcium score is 443, which places the patient in the 41 percentile for age and sex matched control.  3. Normal coronary origin with right dominance.   1. Left Main:  No significant stenosis. FFR = 0.97  2. LAD: Proximal FFR = 0.95, Mid FFR = 0.81, Distal FFR = 0.73 3. LCX: Proximal FFR = 0.94, Distal FFR = 0.85, OM 2 = 0.84 4. RCA: Proximal FFR = 0.96, Mid FFR =0.87, Distal FFR = 0.84, PDA not mapped  IMPRESSION: 1. CT FFR analysis demonstrates possible hemodynamically significant lesion in the distal LAD. Vessel diameter just proximal to this lesion is 2.65 mm. Consider aggressive medical management for distal CAD.   Labs/Other Tests and Data Reviewed:    EKG:  No ECG reviewed.  Recent Labs: 06/18/2019: ALT 14; Hemoglobin 13.1; Platelets 251   Recent Lipid Panel No results found for: CHOL, TRIG, HDL, CHOLHDL, LDLCALC, LDLDIRECT  Wt Readings from Last 3 Encounters:  06/01/19 136 lb 12.8 oz (62.1 kg)  04/30/19 135 lb 6.4 oz (61.4 kg)  01/29/18 141 lb (64 kg)     Objective:    Vital Signs:  Ht 4\' 10"  (1.473 m)   BMI 28.59 kg/m    General: Pleasant female sounding in NAD Psych: Normal affect. Neuro: Alert and oriented X 3. Lungs:  Resp regular and unlabored while talking on the phone.   ASSESSMENT & PLAN:    1. CAD - recent Coronary CT showed severe CAD in the distal RCA and proximal-distal circumflex artery which was sent  for FFR. FFR demonstrated a possible hemodynamically significant lesion in the distal LAD with aggressive medical management recommended.  - she reports a variety of pains along her stomach, bilateral shoulder blades, and breasts which have been occurring for several months. Not associated with exertion and she has been able to climb stairs without symptoms. Continue with plans for Abdominal CT and Mammogram as scheduled by GI and her PCP. Would favor  continued medical management for her CAD at this time. We reviewed if she developed worsening symptoms then to make [place] aware.  - continue ASA, statin, and Ranexa 500mg  BID.   2. Abdominal Pain - reports pain along her epigastric region with a palpable mass which she says feels like a golf ball. No associated nausea, vomiting, diarrhea, or constipation.  - being followed by GI and scheduled for an Abdominal CT next month.   3. HTN - she does not have a BP cuff at home but reports this has been well-controlled when checked at office visits.  - continue current medication regimen at this time with Verapamil 240mg  daily.   4. HLD - followed by PCP. Will request most recent FLP.  - remains on Atorvastatin 10mg  daily (previously intolerant to 40mg  daily dosing but if LDL above 70, would attempt to titrate Atorvastatin to 20mg  daily).  5. Tobacco Use - she continues to smoke 0.5 ppd. Cessation advised.    COVID-19 Education: The signs and symptoms of COVID-19 were discussed with the patient and how to seek care for testing (follow up with PCP or arrange E-visit).  The importance of social distancing was discussed today.  Time:   Today, I have spent 28 minutes with the patient with telehealth technology discussing the above problems.     Medication Adjustments/Labs and Tests Ordered: Current medicines are reviewed at length with the patient today.  Concerns regarding medicines are outlined above.   Tests Ordered: No orders of the defined types  were placed in this encounter.   Medication Changes: No orders of the defined types were placed in this encounter.   Follow Up:  In Person in 3 month(s)  Signed, Erma Heritage, PA-C  06/22/2019 2:36 PM    [facility]

## 2019-06-24 ENCOUNTER — Ambulatory Visit: Payer: Medicaid Other | Admitting: Gastroenterology

## 2019-07-05 ENCOUNTER — Other Ambulatory Visit: Payer: Self-pay

## 2019-07-05 ENCOUNTER — Ambulatory Visit (HOSPITAL_COMMUNITY)
Admission: RE | Admit: 2019-07-05 | Discharge: 2019-07-05 | Disposition: A | Discharge status: Home / Self Care | Payer: Medicaid Other | Source: Ambulatory Visit | Attending: Gastroenterology | Admitting: Gastroenterology

## 2019-07-05 DIAGNOSIS — R1010 Upper abdominal pain, unspecified: Secondary | ICD-10-CM | POA: Diagnosis present

## 2019-07-05 LAB — POCT I-STAT CREATININE: Creatinine, Ser: 0.8 mg/dL (ref 0.44–1.00)

## 2019-07-05 MED ORDER — IOHEXOL 300 MG/ML  SOLN
100.0000 mL | Freq: Once | INTRAMUSCULAR | Status: AC | PRN
  Administered 2019-07-05: 100 mL via INTRAVENOUS

## 2019-07-06 ENCOUNTER — Telehealth: Payer: Self-pay | Admitting: Gastroenterology

## 2019-07-06 NOTE — Progress Notes (Signed)
CT scan without findings to explain patients chronic RUQ pain and upper abdominal burning.

## 2019-07-06 NOTE — Telephone Encounter (Signed)
[name], can we get a progress report from patient and also let her know CT scan without findings to explain her chronic RUQ pain and upper abdominal burning.

## 2019-07-07 NOTE — Telephone Encounter (Signed)
Spoke with pt. She is still has soreness, stinging and says it feels like a hard golf ball is inside. She states that she isn't any better and her symptoms have been there for a long time. Pain was on the rt side and now is on the mid abdomen. Pt doesn't know what's wrong since nothing is being detected. Pt states that she wants an MRI and she needs to know what's wrong with her. Pain level gets to a 10 with 10 being the highest per pt. Pt also said she wants a second opinion with Dr. Laural Golden.

## 2019-07-07 NOTE — Telephone Encounter (Signed)
I can try changing patients PPI to see if this helps. We can also get her set up to see Dr. Gala Romney in the office if she would like that. If patient wants to be seen by Dr. Laural Golden, she can call his office and request to be seen. That is her decision. However, if he does see Dr. Laural Golden, all of her care would likely continue with him as we do not like to have patients going back and forth between local GI clinics.

## 2019-07-07 NOTE — Telephone Encounter (Signed)
Ok. She will have to call to request to be seen there.

## 2019-07-07 NOTE — Telephone Encounter (Signed)
Pt doesn't want her PPI changed. Pt would like to see Dr. Laural Golden and is aware that her care will transfer.

## 2019-07-07 NOTE — Telephone Encounter (Signed)
Noted. Pt is aware that she needs to contact Dr. Olevia Perches office.

## 2019-07-07 NOTE — Telephone Encounter (Signed)
Tried calling pt, no VM is available. Will call pt back.

## 2019-07-09 NOTE — Telephone Encounter (Signed)
Patient called in. She states she is going to transfer her care to Dr. Delanna Ahmadi office and asked I cancel her appt with [facility]. FYI to [facility] and [place].

## 2019-07-12 ENCOUNTER — Encounter: Payer: Self-pay | Admitting: General Practice

## 2019-07-12 NOTE — Telephone Encounter (Signed)
Pt telling us she is switching GI docs - based on face value, lets send her a letter making it official

## 2019-07-12 NOTE — Telephone Encounter (Signed)
Discharge letter mailed  

## 2019-07-26 ENCOUNTER — Other Ambulatory Visit: Payer: Self-pay | Admitting: Nurse Practitioner

## 2019-08-02 NOTE — Progress Notes (Signed)
Subjective:    Patient ID: Victoria Townsend, female    DOB: 02-16-1961, 58 y.o.   MRN: [id number]  HPI Victoria Townsend is a 58 y.o. female with a past medical history of asthma, anxiety, PTSD, CAD, hypertension, remote polysubstance abuse, dysphagia, GERD, chronic RUQ pain and colon polyps. She was previously followed by Dr. Gala Romney and Flossie Dibble PA-C at [facility]. She presents today to obtain a 2nd GI opinion. History of chronic RUQ pain. Past cholecystectomy. She complains of having RUQ, intermittently feels like a ball rolls around under the right rib cage for the past 8 years, worse over the past 2 years. She describes having RUQ pain described as a stinging pain which has progressed and radiated across to the LUQ. Her upper abdominal pain comes and goes, sometimes goes away for 1 week at at time. She sometimes notices a bulge to the RUQ. No weight loss. No fever, sweats or chills. She sometimes notices more upper abdominal stinging pain when she walks up stairs or with increased exertion. Other times, the same upper abdominal pain occurs randomly. No nausea or vomiting. No specific food or stress triggers. She is passing a normal formed brown stool once daily. No rectal bleeding or black stool. She has frequent urination which she attributes to drinking a lot of water throughout the day. She used Cocaine at the age of 61. No IV drug use. She takes ASA 81mg  daily. No other NSAID use. Her sister was diagnosed with liver and lung cancer at the age of 34, she had chronic hepatitis C and she was an alcoholic. She reported her sister's liver cancer was not seen on CT but was later identified by MRI. She worries about her risk of cancer due to a significant family history of cancer. Maternal aunt with stomach cancer. Paternal uncle with colon cancer. Paternal GM with stomach cancer. Paternal Aunt with Breast cancer. Maternal aunt with ovarian cancer.   She has undergone a thorough  evaluation by [facility] including:   An abdominal pelvic CT with contrast 07/05/2019: Two nonobstructing bilateral renal calculi measuring up to 3 mm. No Hydronephrosis.  Status post cholecystectomy and appendectomy. No evidence of bowel obstruction.  No CT findings to account for the patient's chronic upper abdominal pain.  EGD 02/08/2018: - Normal esophagus. Dilated with a Maloney dilator with mild resistance at 74 Fr. - Normal stomach. - Normal duodenal bulb and second portion of the duodenum. - No specimens collected.  EGD 08/09/2013: small hiatal hernia and a few 1-57mm gastric hyperplastic polyps. No H. Pylori.  Colonoscopy 02/08/2018: Adequate prep - Internal hemorrhoids. - One 4 mm tubular adenomatous polyp at the splenic flexure removed. - Diverticulosis in the sigmoid colon and in the descending colon. -91yr recall.   Carotid Dopplers: 06/02/2019 Summary: Right Carotid: Velocities in the right ICA are consistent with a 1-39% stenosis. Left Carotid: Velocities in the left ICA are consistent with a 1-39% stenosis. Vertebrals: Bilateral vertebral arteries demonstrate antegrade flow. Subclavians: Normal flow hemodynamics were seen in bilateral subclavian arteries.  She underwent cardiology evaluation by Dr .Domenic Polite due to having chest pain.  Coronary CT: 05/20/2019 IMPRESSION: 1. Severe CAD in the distal RCA and proximal-distal circumflex artery, CADRADS = 4. CT FFR analysis will be performed and reported separately. 2. The patient's coronary artery calcium score is 443, which places the patient in the 92 percentile for age and sex matched control. 3. Normal coronary origin with right dominance. 1. Left Main: No significant stenosis.  FFR = 0.97 2. LAD: Proximal FFR = 0.95, Mid FFR = 0.81, Distal FFR = 0. 3. LCX: Proximal FFR = 0.94, Distal FFR = 0.85, OM 2 = 0.84 4. RCA: Proximal FFR = 0.96, Mid FFR =0.87, Distal FFR = 0.84, PDA not mapped  IMPRESSION: 1. CT FFR  analysis demonstrates possible hemodynamically significant lesion in the distal LAD. Vessel diameter just proximal to this lesion is 2.65 mm. Consider aggressive medical management for distal CAD.  Past Medical History:  Diagnosis Date   Agoraphobia    Anxiety disorder    Asthma    Bladder polyps    CAD (coronary artery disease)    Essential hypertension    GERD (gastroesophageal reflux disease)    Herpes    History of chest pain    Negative ischemic work-up over time   Irregular heartbeat    Nephrolithiasis    Polysubstance abuse ([place])    History of cocaine, alcohol, marijuana in the past   PTSD (post-traumatic stress disorder)    Shoulder pain    Shoulder has been injected   Past Surgical History:  Procedure Laterality Date   APPENDECTOMY     Bladder polyps     CESAREAN SECTION WITH BILATERAL TUBAL LIGATION     CHOLECYSTECTOMY     COLONOSCOPY  May 2012   Dr. Posey Pronto: scattered diverticula pancolonic   COLONOSCOPY WITH PROPOFOL N/A 01/29/2018   Procedure: COLONOSCOPY WITH PROPOFOL;  Surgeon: Daneil Dolin, MD;  [facility];  Service: Endoscopy;  Laterality: N/A;   ESOPHAGOGASTRODUODENOSCOPY  April 2012   Dr. Posey Pronto: gastritis   ESOPHAGOGASTRODUODENOSCOPY N/A 08/09/2013   [id number] polyps/abnormal gastric because of uncertain significance-status post biopsy. Small hiatal [email or web address] chronic inflammation on bx. hyperplastic polyp   ESOPHAGOGASTRODUODENOSCOPY (EGD) WITH PROPOFOL N/A 01/29/2018   Procedure: ESOPHAGOGASTRODUODENOSCOPY (EGD) WITH PROPOFOL;  Surgeon: Daneil Dolin, MD;  [facility];  Service: Endoscopy;  Laterality: N/A;  1:30   MALONEY DILATION N/A 01/29/2018   Procedure: Venia Minks DILATION;  Surgeon: Daneil Dolin, MD;  [facility];  Service: Endoscopy;  Laterality: N/A;   URETHRAL STRICTURE DILATATION     WRIST FRACTURE SURGERY Right    Current Outpatient Medications on File Prior to Visit    Medication Sig Dispense Refill   acetaminophen (TYLENOL) 500 MG tablet Take 1,000 mg by mouth every 8 (eight) hours as needed for moderate pain.      albuterol (VENTOLIN HFA) 108 (90 BASE) MCG/ACT inhaler Inhale 2 puffs into the lungs every 6 (six) hours as needed for shortness of breath.      ALPRAZolam (XANAX) 1 MG tablet Take 1 mg by mouth 4 (four) times daily.      aspirin (ASPIR-LOW) 81 MG EC tablet Take 81 mg by mouth daily as needed (for chest pain).      atorvastatin (LIPITOR) 10 MG tablet Take 1 tablet by mouth at bedtime.     clobetasol (TEMOVATE) 0.05 % cream Apply 1 application topically daily as needed (for genital itching).      gabapentin (NEURONTIN) 300 MG capsule Take 300 mg by mouth 2 (two) times daily. May take a 3rd dose as needed for pain     hydrocortisone 2.5 % ointment APPLY AS DIRECTED DAILY AS NEEDED FOR ITCHING. 28.35 g 2   oxybutynin (DITROPAN) 5 MG tablet Take 2.5 mg by mouth as needed.   3   pantoprazole (PROTONIX) 40 MG tablet Take 40 mg by mouth 2 (two) times daily.  ranolazine (RANEXA) 500 MG 12 hr tablet Take 1 tablet (500 mg total) by mouth 2 (two) times daily. 60 tablet 3   valACYclovir (VALTREX) 1000 MG tablet Take 500 mg by mouth daily.      verapamil (CALAN-SR) 240 MG CR tablet Take 240 mg by mouth daily.     No current facility-administered medications on file prior to visit.    Allergies  Allergen Reactions   Ciprofloxacin Swelling and Other (See Comments)    Facial swelling   Codeine Nausea And Vomiting   Ibuprofen Other (See Comments)    Oral cavity burning sensations   Imdur [Isosorbide Nitrate] Other (See Comments)    Headaches   Mucinex [Guaifenesin Er] Nausea And Vomiting   Roxicodone [Oxycodone] Swelling    Mouth swelling   Erythromycin Other (See Comments)    Facial swelling   Shellfish Allergy Rash   Family History  Problem Relation Age of Onset   Coronary artery disease Mother        CABG   Colon  cancer Paternal Uncle 51   Stomach cancer Paternal Grandmother 32   Stomach cancer Maternal Aunt 60   Breast cancer Paternal Aunt    Ovarian cancer Maternal Aunt    Liver cancer Sister    Lung cancer Sister    Social History   Socioeconomic History   Marital status: Single    Spouse name: Not on file   Number of children: Not on file   Years of education: Not on file   Highest education level: Not on file  Occupational History   Not on file  Social Needs   Financial resource strain: Not on file   Food insecurity    Worry: Not on file    Inability: Not on file   Transportation needs    Medical: Not on file    Non-medical: Not on file  Tobacco Use   Smoking status: Current Every Day Smoker    Packs/day: 1.00    Years: 45.00    Pack years: 45.00    Types: Cigarettes   Smokeless tobacco: Never Used  Substance and Sexual Activity   Alcohol use: No    Alcohol/week: 0.0 standard drinks    Comment: Former user, heavy etoh use prior to 1990s.   Drug use: Not Currently    Types: Marijuana, Cocaine    Comment: 04/13/19-no cocaine; denies marijuana   Sexual activity: Not on file  Lifestyle   Physical activity    Days per week: Not on file    Minutes per session: Not on file   Stress: Not on file  Relationships   Social connections    Talks on phone: Not on file    Gets together: Not on file    Attends religious service: Not on file    Active member of club or organization: Not on file    Attends meetings of clubs or organizations: Not on file    Relationship status: Not on file   Intimate partner violence    Fear of current or ex partner: Not on file    Emotionally abused: Not on file    Physically abused: Not on file    Forced sexual activity: Not on file  Other Topics Concern   Not on file  Social History Narrative   ** Merged History Encounter **       Review of Systems : See HPI all other systems reviewed and are negative       Objective:  Physical Exam  Blood pressure 121/78, pulse (!) 49, temperature 97.9 F (36.6 C), height 4\' 10"  (1.473 m), weight 137 lb 14.4 oz (62.6 kg). General: 58 y.o. female in NAD Eyes: sclera nonicteric, conjunctiva pink Mouth: no ulcers or lesion Neck: supple, no thyromegaly or lymphadenopathy Heart: RRR, no murmurs Lungs: clear throughout Abdomen: mild tenderness to the RUQ are of prior CCY scar, no rebound or guarding, no abdomina hernia, negative Carnett's sign, soft, nondisteneded, + BS x 4 quads, no HSM. Extremities: no edema Neuro: alert and oriented x 4, no focal deficits     Assessment & Plan:   1. 58 y.o. female with chronic RUQ pain which radiates to LUQ.  Normal EGD 01/2018. CTAP x 2 without identifying cause of RUQ pain. S/P cholecystectomy. Possible adhesions from prior CCY surgery, possible internal hernia.  -check Celiac panel  -check hepatitis C antibody (past hx of Cocaine use) -check abdominal doppler to assess the celiac and SMA to rule out abd ischemia with c/o increased upper abdominal pain when walking up stairs/incresed physical activity -If abdominal doppler negative, will order abdominal MRI w/wo contrast  -Dicyclomine 10mg  one tab po bid  -if the above evaluation negative, I will order a doppler duplex of the celiac and superior mesenteric artery to rule out ischemic cause of RUQ pain in patient with evidence of coronary and carotid artery disease and + 45 yr smoker  2. GERD -Continue Protonix 40mg  bid for now, most likely can reduce to QD  3. History of a tubular adenomatous polyp, + family history of colon cancer -change colonoscopy recall to 5 yrs, recommend next colonoscopy 01/2023  4. Coronary Artery Disease, carotid artery disease   5. Significant family history of breast, ovarian, stomach, colon, liver and lung cancer -consider genetic testing ie: 64 and me, to discuss further with PCP

## 2019-08-03 ENCOUNTER — Telehealth: Payer: Self-pay | Admitting: Cardiology

## 2019-08-03 ENCOUNTER — Encounter (INDEPENDENT_AMBULATORY_CARE_PROVIDER_SITE_OTHER): Payer: Self-pay | Admitting: *Deleted

## 2019-08-03 ENCOUNTER — Ambulatory Visit (INDEPENDENT_AMBULATORY_CARE_PROVIDER_SITE_OTHER): Payer: Medicaid Other | Admitting: Nurse Practitioner

## 2019-08-03 ENCOUNTER — Other Ambulatory Visit: Payer: Self-pay

## 2019-08-03 ENCOUNTER — Encounter (INDEPENDENT_AMBULATORY_CARE_PROVIDER_SITE_OTHER): Payer: Self-pay | Admitting: Nurse Practitioner

## 2019-08-03 VITALS — BP 121/78 | HR 49 | Temp 97.9°F | Ht <= 58 in | Wt 137.9 lb

## 2019-08-03 DIAGNOSIS — R1013 Epigastric pain: Secondary | ICD-10-CM

## 2019-08-03 DIAGNOSIS — R1010 Upper abdominal pain, unspecified: Secondary | ICD-10-CM | POA: Diagnosis not present

## 2019-08-03 DIAGNOSIS — Z1159 Encounter for screening for other viral diseases: Secondary | ICD-10-CM | POA: Diagnosis not present

## 2019-08-03 MED ORDER — DICYCLOMINE HCL 10 MG PO CAPS: 10.0000 mg | ORAL_CAPSULE | Freq: Two times a day (BID) | ORAL | 0 refills | Status: DC | PRN

## 2019-08-03 NOTE — Patient Instructions (Signed)
1. Try IBgard (peppermint oil) one capsule by mouth twice daily for abdominal pain  2. Dicyclomine 10mg  one tab twice daily as needed for abdominal pain  3. Schedule abdominal doppler to assess the circulation to your upper abdomen   4. Complete the requested lab tests   5. Follow up in office in 1 month

## 2019-08-03 NOTE — Telephone Encounter (Signed)
Calling to give update with her visit with gastrologist today

## 2019-08-04 NOTE — Telephone Encounter (Signed)
Patient informed - suggested she monitor her BP & heart rate over the next 7-10 days.  Suggested she stay hydrated as well.  Stated she has been under a lot of stress as well with deaths in her family.  Also if she has any new symptoms of dizziness, fatigue or weakness to let us know.  Would not want to make any changes based on one number.  She verbalized understanding.

## 2019-08-04 NOTE — Telephone Encounter (Signed)
Called back about seeing Dr Laural Golden yesterday.    Temp 97.9 BP 121/78   pulse was 49.  BMI  28.82

## 2019-08-05 LAB — HEPATITIS C ANTIBODY
Hepatitis C Ab: NONREACTIVE
SIGNAL TO CUT-OFF: 0.01 (ref <1.00)

## 2019-08-05 LAB — CELIAC DISEASE PANEL
(tTG) Ab, IgA: 1 U/mL
(tTG) Ab, IgG: 1 U/mL
Gliadin IgA: 2 Units
Gliadin IgG: 1 Units
Immunoglobulin A: 153 mg/dL (ref 47–310)

## 2019-08-05 LAB — C-REACTIVE PROTEIN: CRP: 1.5 mg/L (ref <8.0)

## 2019-08-09 ENCOUNTER — Encounter (INDEPENDENT_AMBULATORY_CARE_PROVIDER_SITE_OTHER): Payer: Self-pay | Admitting: Nurse Practitioner

## 2019-08-11 ENCOUNTER — Ambulatory Visit: Payer: Medicaid Other | Admitting: Gastroenterology

## 2019-08-19 ENCOUNTER — Ambulatory Visit (HOSPITAL_COMMUNITY)
Admission: RE | Admit: 2019-08-19 | Discharge: 2019-08-19 | Disposition: A | Discharge status: Home / Self Care | Payer: Medicaid Other | Source: Ambulatory Visit | Attending: Nurse Practitioner | Admitting: Nurse Practitioner

## 2019-08-19 ENCOUNTER — Other Ambulatory Visit: Payer: Self-pay

## 2019-08-19 ENCOUNTER — Encounter (INDEPENDENT_AMBULATORY_CARE_PROVIDER_SITE_OTHER): Payer: Self-pay | Admitting: Nurse Practitioner

## 2019-08-19 DIAGNOSIS — R1010 Upper abdominal pain, unspecified: Secondary | ICD-10-CM | POA: Diagnosis present

## 2019-08-19 DIAGNOSIS — R1013 Epigastric pain: Secondary | ICD-10-CM | POA: Diagnosis present

## 2019-08-25 ENCOUNTER — Other Ambulatory Visit: Payer: Self-pay | Admitting: Cardiology

## 2019-09-02 ENCOUNTER — Ambulatory Visit (INDEPENDENT_AMBULATORY_CARE_PROVIDER_SITE_OTHER): Payer: Medicaid Other | Admitting: Nurse Practitioner

## 2019-09-02 ENCOUNTER — Other Ambulatory Visit: Payer: Self-pay

## 2019-09-02 ENCOUNTER — Encounter (INDEPENDENT_AMBULATORY_CARE_PROVIDER_SITE_OTHER): Payer: Self-pay | Admitting: Nurse Practitioner

## 2019-09-02 VITALS — BP 123/74 | HR 72 | Temp 97.8°F | Ht <= 58 in | Wt 138.2 lb

## 2019-09-02 DIAGNOSIS — K219 Gastro-esophageal reflux disease without esophagitis: Secondary | ICD-10-CM

## 2019-09-02 DIAGNOSIS — R1011 Right upper quadrant pain: Secondary | ICD-10-CM | POA: Diagnosis not present

## 2019-09-02 NOTE — Progress Notes (Signed)
Subjective:    Patient ID: Victoria Townsend, female    DOB: 12/03/1960, 58 y.o.   MRN: [id number]  HPI Victoria Townsend is a 58 y.o. female with a past medical history of asthma, anxiety, PTSD, CAD, hypertension, remote polysubstance abuse, dysphagia, GERD, chronic RUQ pain and colon polyps.  Refer to her last office visit 08/03/2019.  She presents today for follow-up regarding Victoria Townsend right upper quadrant abdominal pain.  She underwent an abdominal doppler duplex 08/19/2019 which was normal, no evidence of any abdominal arterial stenosis.  She was prescribed dicyclomine 10 mg p.o. twice daily as needed.  She hasn't  needed to take this medication.  She stated since her last office visit she has not had any significant right upper quadrant griping type pain.  She is passing up to 3 formed bowel movements daily.  She eats bran cereal daily which keeps her regular.  She is having shortness of breath and she is scheduled to see her pulmonologist.  She is smoking 1/2 pack of cigarettes daily.  She has had some chest pain and she is scheduled to see her cardiologist as well.  She has back and left hip pain.  No complaints today.  An abdominal pelvic CT with contrast 07/05/2019: Two nonobstructing bilateral renal calculi measuring up to 3 mm. No Hydronephrosis.  Status post cholecystectomy and appendectomy. No evidence of bowel obstruction.  No CT findings to account for the patient's chronic upper abdominal pain.  EGD 02/08/2018: - Normal esophagus. Dilated with a Maloney dilator with mild resistance at 30 Fr. - Normal stomach. - Normal duodenal bulb and second portion of the duodenum. - No specimens collected.  EGD 08/09/2013: small hiatal hernia and a few 1-10mm gastric hyperplastic polyps. No H. Pylori.  Colonoscopy 02/08/2018: Adequate prep - Internal hemorrhoids. - One 4 mm tubular adenomatous polyp at the splenic flexure removed. - Diverticulosis in the sigmoid colon and in the descending colon. -55yr recall.    Past Medical History:  Diagnosis Date  . Agoraphobia   . Anxiety disorder   . Asthma   . Bladder polyps   . CAD (coronary artery disease)   . Essential hypertension   . GERD (gastroesophageal reflux disease)   . Herpes   . History of chest pain    Negative ischemic work-up over time  . Irregular heartbeat   . Nephrolithiasis   . Polysubstance abuse ([place])    History of cocaine, alcohol, marijuana in the past  . PTSD (post-traumatic stress disorder)   . Shoulder pain    Shoulder has been injected   Past Surgical History:  Procedure Laterality Date  . APPENDECTOMY    . Bladder polyps    . CESAREAN SECTION WITH BILATERAL TUBAL LIGATION    . CHOLECYSTECTOMY    . COLONOSCOPY  May 2012   Dr. Posey Pronto: scattered diverticula pancolonic  . COLONOSCOPY WITH PROPOFOL N/A 01/29/2018   Procedure: COLONOSCOPY WITH PROPOFOL;  Surgeon: Daneil Dolin, MD;  [facility];  Service: Endoscopy;  Laterality: N/A;  . ESOPHAGOGASTRODUODENOSCOPY  April 2012   Dr. Posey Pronto: gastritis  . ESOPHAGOGASTRODUODENOSCOPY N/A 08/09/2013   [id number] polyps/abnormal gastric because of uncertain significance-status post biopsy. Small hiatal PrismBlog.es chronic inflammation on bx. hyperplastic polyp  . ESOPHAGOGASTRODUODENOSCOPY (EGD) WITH PROPOFOL N/A 01/29/2018   Procedure: ESOPHAGOGASTRODUODENOSCOPY (EGD) WITH PROPOFOL;  Surgeon: Daneil Dolin, MD;  [facility];  Service: Endoscopy;  Laterality: N/A;  1:30  . MALONEY DILATION N/A 01/29/2018   Procedure: MALONEY DILATION;  Surgeon: Daneil Dolin, MD;  [facility];  Service: Endoscopy;  Laterality: N/A;  . URETHRAL STRICTURE DILATATION    . WRIST FRACTURE SURGERY Right       Objective:   Physical Exam  Blood pressure 123/74, pulse 72, temperature 97.8 F (36.6 C), height 4\' 10"  (1.473 m), weight 138 lb 3.2 oz (62.7 kg). General: 58 year old female in no acute distress Eyes: Sclera nonicteric, heart: Regular rate and rhythm,  2/6 systolic murmur Lungs: Breath sounds clear throughout Abdomen: Soft, nondistended, nontender, positive bowel sounds to all 4 quadrants, no organomegaly, right upper quadrant scar intact Extremities: No edema Neuro: Alert and oriented x4, no focal deficits    Assessment & Plan:   1. 58 y.o. female with chronic RUQ pain which radiates to LUQ.  Normal EGD 01/2018. CTAP x 2 without identifying cause of RUQ pain.  An abdominal doppler/duplex was normal.  S/P cholecystectomy. Possible adhesions from prior CCY surgery, possible internal hernia.   Right upper quadrant pain has significantly improved since her last office visit.  No current right upper quadrant abdominal pain. -Dicyclomine 10 mg p.o. twice daily if needed for right upper quadrant abdominal pain -She will call our office if her symptoms worsen -Follow-up up in 6 months and as needed  2.  GERD, stable  3. History of a tubular adenomatous polyp, + family history of colon cancer -change colonoscopy recall to 5 yrs, recommend next colonoscopy 01/2023

## 2019-09-02 NOTE — Patient Instructions (Addendum)
1.  Your right upper quadrant pain has significantly improved since her last office visit.  As previously prescribed, you may take the dicyclomine 10 mg 1 tab by mouth twice daily if this discomfort recurs.  2.  Follow-up with your primary care physician regarding your complaints of shortness of breath  3.  Follow-up in office in 6 months and as needed

## 2019-09-08 ENCOUNTER — Telehealth: Payer: Self-pay | Admitting: Cardiology

## 2019-09-08 NOTE — Telephone Encounter (Signed)
Patient called stating that she continues to have shortness of breath .She also haves questions in regards to her medications.

## 2019-09-08 NOTE — Telephone Encounter (Signed)
Patient thinks generic ranexa is causing her to be SOB and reports using her inhaler more often. Denies dizziness or chest pain at this time but reports chest pain 3 days ago rated 10/10 described as sore and tender. Says she took an aspirin 81 mg and the pain resolved. Reports a sore and tender spot on her left breast that is movable when she touches it. Advised to keep her appointment with Domenic Polite on 09/27/2019 to discuss sob and chest pain. Advised to contact her PCP about her soreness in her breast. Advised if symptoms get worse, to go to the ED for an evaluation. Also advised that the only way to determine if her generic ranexa is causing her sob would be to stop it. Patient says she doesn't want to stop her ranexa. Verbalized understanding of plan.

## 2019-09-10 ENCOUNTER — Telehealth (INDEPENDENT_AMBULATORY_CARE_PROVIDER_SITE_OTHER): Payer: Self-pay | Admitting: Nurse Practitioner

## 2019-09-10 NOTE — Telephone Encounter (Signed)
Patient called stated she is still having abdominal pain - ph# [phone]

## 2019-09-13 ENCOUNTER — Other Ambulatory Visit (INDEPENDENT_AMBULATORY_CARE_PROVIDER_SITE_OTHER): Payer: Self-pay | Admitting: Nurse Practitioner

## 2019-09-13 DIAGNOSIS — R1011 Right upper quadrant pain: Secondary | ICD-10-CM

## 2019-09-13 NOTE — Telephone Encounter (Signed)
I called the patient, she stated her RUQ burning knot like pain like a ball is inside moving around has recurred. Sister with liver ca that was found on MRI but not on CT (of note I explained to her that her sister had a history of hepatitis C and was an alcoholic and clearly was at a higher risk of liver cancer). However, as her right upper quadrant abdominal pain remains unexplained, I will order an abdominal MRI with IV contrast but she will need to have a BMP in 2 to 3 days prior to proceeding with the MRI.  The patient verbalized understanding this information.  If the abdominal MRI is negative, I would recommend a consult with pain management versus a trial with amitriptyline as previously recommended by Dr. Dorien Chihuahua.

## 2019-09-13 NOTE — Telephone Encounter (Signed)
Victoria Townsend, pls contact patient to schedule an abdominal MRI, orders entered. She needs to get a BMP done 2 to 3 days prior to her MRI. I explained this to the patient. Do not use the phone # in the msg below as it is incorrect. thx

## 2019-09-13 NOTE — Telephone Encounter (Signed)
PA has been submitted, will schedule once this is received

## 2019-09-20 NOTE — Telephone Encounter (Signed)
MRI sch'd 11/3, patient aware

## 2019-09-21 ENCOUNTER — Other Ambulatory Visit (HOSPITAL_COMMUNITY): Payer: Self-pay | Admitting: Respiratory Therapy

## 2019-09-21 ENCOUNTER — Encounter (HOSPITAL_COMMUNITY): Payer: Self-pay | Admitting: *Deleted

## 2019-09-21 DIAGNOSIS — R0602 Shortness of breath: Secondary | ICD-10-CM

## 2019-09-21 NOTE — Progress Notes (Signed)
Patient was referred to our office for low-dose CT scan by Dr. Luan Pulling.  I attempted to call her today to do her screening. I was unable to reach her for scheduling.  I have left her a VM to call for scheduling.

## 2019-09-22 ENCOUNTER — Ambulatory Visit: Payer: Medicaid Other | Admitting: Cardiology

## 2019-09-24 ENCOUNTER — Ambulatory Visit (HOSPITAL_COMMUNITY): Payer: Medicaid Other

## 2019-09-27 ENCOUNTER — Ambulatory Visit: Payer: Medicaid Other | Admitting: Cardiology

## 2019-09-27 NOTE — Progress Notes (Deleted)
Cardiology Office Note  Date: 09/27/2019   ID: Victoria Townsend, DOB [birth year], MRN [id number]  PCP:  Sandi Mealy, MD  Cardiologist:  Rozann Lesches, MD Electrophysiologist:  None   No chief complaint on file.   History of Present Illness: Victoria Townsend is a 58 y.o. female last assessed via telehealth encounter in July by Ms. Strader PA-C.  She continues on medical therapy with documented coronary atherosclerosis by cardiac CTA.  Past Medical History:  Diagnosis Date  . Agoraphobia   . Anxiety disorder   . Asthma   . Bladder polyps   . CAD (coronary artery disease)   . Essential hypertension   . GERD (gastroesophageal reflux disease)   . Herpes   . History of chest pain    Negative ischemic work-up over time  . Irregular heartbeat   . Nephrolithiasis   . Polysubstance abuse ([place])    History of cocaine, alcohol, marijuana in the past  . PTSD (post-traumatic stress disorder)   . Shoulder pain    Shoulder has been injected    Past Surgical History:  Procedure Laterality Date  . APPENDECTOMY    . Bladder polyps    . CESAREAN SECTION WITH BILATERAL TUBAL LIGATION    . CHOLECYSTECTOMY    . COLONOSCOPY  May 2012   Dr. Posey Pronto: scattered diverticula pancolonic  . COLONOSCOPY WITH PROPOFOL N/A 01/29/2018   Procedure: COLONOSCOPY WITH PROPOFOL;  Surgeon: Daneil Dolin, MD;  [facility];  Service: Endoscopy;  Laterality: N/A;  . ESOPHAGOGASTRODUODENOSCOPY  April 2012   Dr. Posey Pronto: gastritis  . ESOPHAGOGASTRODUODENOSCOPY N/A 08/09/2013   [id number] polyps/abnormal gastric because of uncertain significance-status post biopsy. Small hiatal PrismBlog.es chronic inflammation on bx. hyperplastic polyp  . ESOPHAGOGASTRODUODENOSCOPY (EGD) WITH PROPOFOL N/A 01/29/2018   Procedure: ESOPHAGOGASTRODUODENOSCOPY (EGD) WITH PROPOFOL;  Surgeon: Daneil Dolin, MD;  [facility];  Service: Endoscopy;  Laterality: N/A;  1:30  . MALONEY DILATION N/A 01/29/2018   Procedure: Venia Minks DILATION;  Surgeon: Daneil Dolin, MD;  [facility];  Service: Endoscopy;  Laterality: N/A;  . URETHRAL STRICTURE DILATATION    . WRIST FRACTURE SURGERY Right     Current Outpatient Medications  Medication Sig Dispense Refill  . acetaminophen (TYLENOL) 500 MG tablet Take 1,000 mg by mouth every 8 (eight) hours as needed for moderate pain.     Marland Kitchen albuterol (VENTOLIN HFA) 108 (90 BASE) MCG/ACT inhaler Inhale 2 puffs into the lungs every 6 (six) hours as needed for shortness of breath.     . ALPRAZolam (XANAX) 1 MG tablet Take 1 mg by mouth 4 (four) times daily.     Marland Kitchen aspirin (ASPIR-LOW) 81 MG EC tablet Take 81 mg by mouth daily as needed (for chest pain).     Marland Kitchen atorvastatin (LIPITOR) 10 MG tablet Take 1 tablet by mouth at bedtime.    . clobetasol (TEMOVATE) 0.05 % cream Apply 1 application topically daily as needed (for genital itching).     Marland Kitchen dicyclomine (BENTYL) 10 MG capsule Take 1 capsule (10 mg total) by mouth 2 (two) times daily as needed for spasms (abdominal pain). 30 capsule 0  . gabapentin (NEURONTIN) 300 MG capsule Take 300 mg by mouth 2 (two) times daily. May take a 3rd dose as needed for pain    . hydrocortisone 2.5 % ointment APPLY AS DIRECTED DAILY AS NEEDED FOR ITCHING. 28.35 g 2  . oxybutynin (DITROPAN) 5 MG tablet Take 2.5 mg by mouth  as needed.   3  . pantoprazole (PROTONIX) 40 MG tablet Take 40 mg by mouth 2 (two) times daily.     Marland Kitchen RANEXA 500 MG 12 hr tablet TAKE ONE TABLET BY MOUTH TWICE DAILY 60 tablet 6  . valACYclovir (VALTREX) 1000 MG tablet Take 500 mg by mouth daily.     . verapamil (CALAN-SR) 240 MG CR tablet Take 240 mg by mouth daily.     No current facility-administered medications for this visit.    Allergies:  Ciprofloxacin, Codeine, Ibuprofen, Imdur [isosorbide nitrate], Mucinex [guaifenesin er], Roxicodone [oxycodone], Erythromycin, and Shellfish allergy   Social History: The patient  reports that she has been smoking  cigarettes. She has a 45.00 pack-year smoking history. She has never used smokeless tobacco. She reports previous drug use. Drugs: Marijuana and Cocaine. She reports that she does not drink alcohol.   Family History: The patient's family history includes Breast cancer in her paternal aunt; Colon cancer (age of onset: 65) in her paternal uncle; Coronary artery disease in her mother; Liver cancer in her sister; Lung cancer in her sister; Ovarian cancer in her maternal aunt; Stomach cancer (age of onset: 50) in her maternal aunt; Stomach cancer (age of onset: 47) in her paternal grandmother.   ROS:  Please see the history of present illness. Otherwise, complete review of systems is positive for {NONE DEFAULTED:18576::"none"}.  All other systems are reviewed and negative.   Physical Exam: VS:  There were no vitals taken for this visit., BMI There is no height or weight on file to calculate BMI.  Wt Readings from Last 3 Encounters:  09/02/19 138 lb 3.2 oz (62.7 kg)  08/03/19 137 lb 14.4 oz (62.6 kg)  06/01/19 136 lb 12.8 oz (62.1 kg)    General: Patient appears comfortable at rest. HEENT: Conjunctiva and lids normal, oropharynx clear with moist mucosa. Neck: Supple, no elevated JVP or carotid bruits, no thyromegaly. Lungs: Clear to auscultation, nonlabored breathing at rest. Cardiac: Regular rate and rhythm, no S3 or significant systolic murmur, no pericardial rub. Abdomen: Soft, nontender, no hepatomegaly, bowel sounds present, no guarding or rebound. Extremities: No pitting edema, distal pulses 2+. Skin: Warm and dry. Musculoskeletal: No kyphosis. Neuropsychiatric: Alert and oriented x3, affect grossly appropriate.  ECG:  An ECG dated 01/27/2018 was personally reviewed today and demonstrated:  Sinus rhythm with rightward axis and nonspecific ST changes.  Recent Labwork: 06/18/2019: ALT 14; AST 27; Hemoglobin 13.1; Platelets 251 07/05/2019: Creatinine, Ser 0.80   Other Studies Reviewed Today:   Cardiac CTA 05/20/2019: FINDINGS: Coronary calcium score: The patient's coronary artery calcium score is 443, which places the patient in the 99 percentile.  Coronary arteries: Normal coronary origins.  Right dominance.  Right Coronary Artery: Mild mixed atherosclerotic plaque in the proximal RCA, 25-49% stenosis. Moderate mixed atherosclerotic plaque in the mid RCA, 50-69%. Severe mixed atherosclerotic plaque in the distal RCA, 70-99% stenosis. Patent PDA and PL with diffuse minimal plaque.  Left Main Coronary Artery: No detectable plaque or stenosis.  Left Anterior Descending Coronary Artery: Mild mixed atherosclerotic plaque in the proximal LAD, 25-49% stenosis. Moderate mixed atherosclerotic plaque in the mid LAD, 50-69%. Moderate mixed atherosclerotic plaque in the distal LAD 50-69%. Patent diagonal branches, mild mixed atherosclerotic plaque in the mid LAD at the takeoff of the first diagonal branch.  Left Circumflex Artery: Moderate mixed atherosclerotic plaque in the proximal L circumflex 50-69%. Possible severe atherosclerotic plaque in the proximal-distal L circumflex, 70-99%, at the bifurcation of medium caliber OM2.  OM1 is small caliber but patent.  Aorta: Normal size, 26 mm at the mid ascending aorta (level of the PA bifurcation) measured double oblique. No calcifications. No dissection.  Aortic Valve: No calcifications.  Other findings:  Normal pulmonary vein drainage into the left atrium.  Normal left atrial appendage without a thrombus.  Normal size of the pulmonary artery.  IMPRESSION: 1. Severe CAD in the distal RCA and proximal-distal circumflex artery, CADRADS = 4. CT FFR analysis will be performed and reported separately.  2. The patient's coronary artery calcium score is 443, which places the patient in the 20 percentile for age and sex matched control.  3. Normal coronary origin with right dominance.  CT FFR 05/20/2019:  FINDINGS: FFRct analysis was performed on the original cardiac CT angiogram dataset. Diagrammatic representation of the FFRct analysis is provided in a separate PDF document in PACS. This dictation was created using the PDF document and an interactive 3D model of the results. 3D model is not available in the EMR/PACS. Normal FFR range is >0.80.  1. Left Main:  No significant stenosis. FFR = 0.97  2. LAD: Proximal FFR = 0.95, Mid FFR = 0.81, Distal FFR = 0.73 3. LCX: Proximal FFR = 0.94, Distal FFR = 0.85, OM 2 = 0.84 4. RCA: Proximal FFR = 0.96, Mid FFR =0.87, Distal FFR = 0.84, PDA not mapped  IMPRESSION: 1. CT FFR analysis demonstrates possible hemodynamically significant lesion in the distal LAD. Vessel diameter just proximal to this lesion is 2.65 mm. Consider aggressive medical management for distal CAD.  Assessment and Plan:    Medication Adjustments/Labs and Tests Ordered: Current medicines are reviewed at length with the patient today.  Concerns regarding medicines are outlined above.   Tests Ordered: No orders of the defined types were placed in this encounter.   Medication Changes: No orders of the defined types were placed in this encounter.   Disposition:  Follow up {follow up:15908}  Signed, Satira Sark, MD, [facility] 09/27/2019 12:58 PM    [facility], [place], [place] [address] Phone: [phone]; Fax: [phone]

## 2019-09-28 ENCOUNTER — Ambulatory Visit (HOSPITAL_COMMUNITY): Payer: Medicaid Other

## 2019-10-06 ENCOUNTER — Encounter (HOSPITAL_COMMUNITY): Payer: Self-pay | Admitting: *Deleted

## 2019-10-06 ENCOUNTER — Other Ambulatory Visit (HOSPITAL_COMMUNITY): Payer: Self-pay | Admitting: *Deleted

## 2019-10-06 DIAGNOSIS — Z87891 Personal history of nicotine dependence: Secondary | ICD-10-CM

## 2019-10-06 DIAGNOSIS — Z122 Encounter for screening for malignant neoplasm of respiratory organs: Secondary | ICD-10-CM

## 2019-10-06 NOTE — Progress Notes (Signed)
Orders placed for LDCT 

## 2019-10-06 NOTE — Progress Notes (Signed)
Received referral for initial lung cancer screening scan.  Contacted patient and obtained smoking history (started age 58 and smoked 3 ppd at that time, at age 25 she dropped to 1 ppd, current smoker, 56 pack year) as well as answering questions related to the screening process.  Patient denies signs/symptoms of lung cancer such as weight loss or hemoptysis.  Patient denies comorbidity that would prevent curative treatment if lung cancer were to be found.  Patient is scheduled for shared decision making visit and CT scan on 11/17 at 1030.

## 2019-10-11 ENCOUNTER — Other Ambulatory Visit: Payer: Self-pay

## 2019-10-12 ENCOUNTER — Ambulatory Visit (HOSPITAL_COMMUNITY)
Admission: RE | Admit: 2019-10-12 | Discharge: 2019-10-12 | Disposition: A | Discharge status: Home / Self Care | Payer: Medicaid Other | Source: Ambulatory Visit | Attending: Hematology | Admitting: Hematology

## 2019-10-12 ENCOUNTER — Encounter (HOSPITAL_COMMUNITY): Payer: Self-pay | Admitting: *Deleted

## 2019-10-12 ENCOUNTER — Inpatient Hospital Stay (HOSPITAL_COMMUNITY): Payer: Medicaid Other | Attending: Hematology | Admitting: Hematology

## 2019-10-12 DIAGNOSIS — Z87891 Personal history of nicotine dependence: Secondary | ICD-10-CM | POA: Diagnosis present

## 2019-10-12 DIAGNOSIS — Z122 Encounter for screening for malignant neoplasm of respiratory organs: Secondary | ICD-10-CM | POA: Insufficient documentation

## 2019-10-12 DIAGNOSIS — I2510 Atherosclerotic heart disease of native coronary artery without angina pectoris: Secondary | ICD-10-CM | POA: Insufficient documentation

## 2019-10-12 DIAGNOSIS — J45909 Unspecified asthma, uncomplicated: Secondary | ICD-10-CM | POA: Insufficient documentation

## 2019-10-12 DIAGNOSIS — Z8249 Family history of ischemic heart disease and other diseases of the circulatory system: Secondary | ICD-10-CM | POA: Insufficient documentation

## 2019-10-12 DIAGNOSIS — Z801 Family history of malignant neoplasm of trachea, bronchus and lung: Secondary | ICD-10-CM | POA: Insufficient documentation

## 2019-10-12 DIAGNOSIS — Z720 Tobacco use: Secondary | ICD-10-CM | POA: Diagnosis not present

## 2019-10-12 DIAGNOSIS — Z803 Family history of malignant neoplasm of breast: Secondary | ICD-10-CM | POA: Insufficient documentation

## 2019-10-12 DIAGNOSIS — I10 Essential (primary) hypertension: Secondary | ICD-10-CM | POA: Insufficient documentation

## 2019-10-12 DIAGNOSIS — K219 Gastro-esophageal reflux disease without esophagitis: Secondary | ICD-10-CM | POA: Insufficient documentation

## 2019-10-12 DIAGNOSIS — F17210 Nicotine dependence, cigarettes, uncomplicated: Secondary | ICD-10-CM | POA: Insufficient documentation

## 2019-10-12 NOTE — Progress Notes (Signed)
[facility] [address] [place] [address]   CLINIC:  Medical Oncology/Hematology  PCP:  Sandi Mealy, MD [place] [address] [phone]   REASON FOR VISIT:  - Shared Decision-Making Visit for Lung Cancer Screening     INTERVAL HISTORY:  Ms. Segner 58 y.o. female presents today for shared decision making visit for lung cancer screening.  She has a past medical history significant for coronary artery disease, hypertension, GERD, PTSD, polysubstance abuse, and asthma.  She reports that she started smoking at age 57.  At one point, patient smoked at least 2 packs of cigarettes a day for approximately 5 years.  Patient continues to smoke 1 pack of cigarettes a day.  She denies any illegal substance abuse at this time.  She reports chronic cough without sputum production.  She reports shortness of breath on exertion that does resolve with rest.  She denies any hemoptysis.  She is unsure of her family history.  She has no personal history of cancer.   REVIEW OF SYSTEMS:  Review of Systems  Constitutional: Positive for fatigue.  HENT:   Positive for trouble swallowing.   Eyes: Negative.   Respiratory: Positive for cough and shortness of breath.   Cardiovascular: Negative.   Gastrointestinal: Positive for abdominal pain.  Endocrine: Negative.   Genitourinary: Negative.    Musculoskeletal: Positive for arthralgias, back pain, gait problem and myalgias.  Skin: Negative.   Neurological: Positive for extremity weakness and gait problem.  Hematological: Negative.   Psychiatric/Behavioral: Positive for depression. The patient is nervous/anxious.      PAST MEDICAL/SURGICAL HISTORY:  Past Medical History:  Diagnosis Date  . Agoraphobia   . Anxiety disorder   . Asthma   . Bladder polyps   . CAD (coronary artery disease)   . Essential hypertension   . GERD (gastroesophageal reflux disease)   . Herpes   . History of chest pain    Negative ischemic work-up over time  . Irregular heartbeat   . Nephrolithiasis   . Polysubstance abuse ([place])    History of cocaine, alcohol, marijuana in the past  . PTSD (post-traumatic stress disorder)   . Shoulder pain    Shoulder has been injected   Past Surgical History:  Procedure Laterality Date  . APPENDECTOMY    . Bladder polyps    . CESAREAN SECTION WITH BILATERAL TUBAL LIGATION    . CHOLECYSTECTOMY    . COLONOSCOPY  May 2012   Dr. Posey Pronto: scattered diverticula pancolonic  . COLONOSCOPY WITH PROPOFOL N/A 01/29/2018   Procedure: COLONOSCOPY WITH PROPOFOL;  Surgeon: Daneil Dolin, MD;  [facility];  Service: Endoscopy;  Laterality: N/A;  . ESOPHAGOGASTRODUODENOSCOPY  April 2012   Dr. Posey Pronto: gastritis  . ESOPHAGOGASTRODUODENOSCOPY N/A 08/09/2013   [id number] polyps/abnormal gastric because of uncertain significance-status post biopsy. Small hiatal PrismBlog.es chronic inflammation on bx. hyperplastic polyp  . ESOPHAGOGASTRODUODENOSCOPY (EGD) WITH PROPOFOL N/A 01/29/2018   Procedure: ESOPHAGOGASTRODUODENOSCOPY (EGD) WITH PROPOFOL;  Surgeon: Daneil Dolin, MD;  [facility];  Service: Endoscopy;  Laterality: N/A;  1:30  . MALONEY DILATION N/A 01/29/2018   Procedure: Venia Minks DILATION;  Surgeon: Daneil Dolin, MD;  [facility];  Service: Endoscopy;  Laterality: N/A;  . URETHRAL STRICTURE DILATATION    . WRIST FRACTURE SURGERY Right      SOCIAL HISTORY:  Social History   Socioeconomic History  . Marital status: Single    Spouse name:  Not on file  . Number of children: Not on file  . Years of education: Not on file  . Highest education level: Not on file  Occupational History  . Not on file  Social Needs  . Financial resource strain: Not on file  . Food insecurity    Worry: Not on file    Inability: Not on file  . Transportation needs    Medical: Not on file    Non-medical: Not on file  Tobacco Use  . Smoking status: Current Every  Day Smoker    Packs/day: 1.00    Years: 45.00    Pack years: 45.00    Types: Cigarettes  . Smokeless tobacco: Never Used  Substance and Sexual Activity  . Alcohol use: No    Alcohol/week: 0.0 standard drinks    Comment: Former user, heavy etoh use prior to 1990s.  . Drug use: Not Currently    Types: Marijuana, Cocaine    Comment: 04/13/19-no cocaine; denies marijuana  . Sexual activity: Not on file  Lifestyle  . Physical activity    Days per week: Not on file    Minutes per session: Not on file  . Stress: Not on file  Relationships  . Social Herbalist on phone: Not on file    Gets together: Not on file    Attends religious service: Not on file    Active member of club or organization: Not on file    Attends meetings of clubs or organizations: Not on file    Relationship status: Not on file  . Intimate partner violence    Fear of current or ex partner: Not on file    Emotionally abused: Not on file    Physically abused: Not on file    Forced sexual activity: Not on file  Other Topics Concern  . Not on file  Social History Narrative   ** Merged History Encounter **        FAMILY HISTORY:  Family History  Problem Relation Age of Onset  . Coronary artery disease Mother        CABG  . Colon cancer Paternal Uncle 12  . Stomach cancer Paternal Grandmother 60  . Stomach cancer Maternal Aunt 60  . Breast cancer Paternal Aunt   . Ovarian cancer Maternal Aunt   . Liver cancer Sister   . Lung cancer Sister     CURRENT MEDICATIONS:  Outpatient Encounter Medications as of 10/12/2019  Medication Sig  . acetaminophen (TYLENOL) 500 MG tablet Take 1,000 mg by mouth every 8 (eight) hours as needed for moderate pain.   Marland Kitchen albuterol (VENTOLIN HFA) 108 (90 BASE) MCG/ACT inhaler Inhale 2 puffs into the lungs every 6 (six) hours as needed for shortness of breath.   . ALPRAZolam (XANAX) 1 MG tablet Take 1 mg by mouth 4 (four) times daily.   Marland Kitchen aspirin (ASPIR-LOW) 81 MG EC  tablet Take 81 mg by mouth daily as needed (for chest pain).   Marland Kitchen atorvastatin (LIPITOR) 10 MG tablet Take 1 tablet by mouth at bedtime.  . clobetasol (TEMOVATE) 0.05 % cream Apply 1 application topically daily as needed (for genital itching).   Marland Kitchen dicyclomine (BENTYL) 10 MG capsule Take 1 capsule (10 mg total) by mouth 2 (two) times daily as needed for spasms (abdominal pain).  Marland Kitchen gabapentin (NEURONTIN) 300 MG capsule Take 300 mg by mouth 2 (two) times daily. May take a 3rd dose as needed for pain  . hydrocortisone 2.5 %  ointment APPLY AS DIRECTED DAILY AS NEEDED FOR ITCHING.  Marland Kitchen oxybutynin (DITROPAN) 5 MG tablet Take 2.5 mg by mouth as needed.   . pantoprazole (PROTONIX) 40 MG tablet Take 40 mg by mouth 2 (two) times daily.   Marland Kitchen RANEXA 500 MG 12 hr tablet TAKE ONE TABLET BY MOUTH TWICE DAILY  . valACYclovir (VALTREX) 1000 MG tablet Take 500 mg by mouth daily.   . verapamil (CALAN-SR) 240 MG CR tablet Take 240 mg by mouth daily.   No facility-administered encounter medications on file as of 10/12/2019.     ALLERGIES:  Allergies  Allergen Reactions  . Ciprofloxacin Swelling and Other (See Comments)    Facial swelling  . Codeine Nausea And Vomiting  . Ibuprofen Other (See Comments)    Oral cavity burning sensations  . Imdur [Isosorbide Nitrate] Other (See Comments)    Headaches  . Mucinex [Guaifenesin Er] Nausea And Vomiting  . Roxicodone [Oxycodone] Swelling    Mouth swelling  . Erythromycin Other (See Comments)    Facial swelling  . Shellfish Allergy Rash     PHYSICAL EXAM:  ECOG Performance status: 1  There were no vitals filed for this visit. There were no vitals filed for this visit.  Physical Exam Constitutional:      Appearance: Normal appearance. She is obese.  HENT:     Head: Normocephalic.     Right Ear: External ear normal.     Left Ear: External ear normal.     Nose: Nose normal.     Mouth/Throat:     Pharynx: Oropharynx is clear.  Eyes:      Conjunctiva/sclera: Conjunctivae normal.  Neck:     Musculoskeletal: Normal range of motion.  Cardiovascular:     Rate and Rhythm: Normal rate and regular rhythm.     Pulses: Normal pulses.     Heart sounds: Normal heart sounds. No friction rub.  Pulmonary:     Effort: Pulmonary effort is normal.     Comments: Decreased breath sounds  Abdominal:     General: Abdomen is flat.     Palpations: Abdomen is soft.     Tenderness: There is no abdominal tenderness.  Musculoskeletal:     Comments: Decreased ROM   Skin:    General: Skin is warm.  Neurological:     General: No focal deficit present.     Mental Status: She is alert and oriented to person, place, and time.  Psychiatric:        Mood and Affect: Mood normal.        Behavior: Behavior normal.        Thought Content: Thought content normal.        Judgment: Judgment normal.      LABORATORY DATA:  I have reviewed the labs as listed.  CBC    Component Value Date/Time   WBC 7.0 06/18/2019 1333   RBC 4.19 06/18/2019 1333   HGB 13.1 06/18/2019 1333   HCT 40.4 06/18/2019 1333   PLT 251 06/18/2019 1333   MCV 96.4 06/18/2019 1333   MCH 31.3 06/18/2019 1333   MCHC 32.4 06/18/2019 1333   RDW 12.3 06/18/2019 1333   LYMPHSABS 2.9 06/18/2019 1333   MONOABS 0.5 06/18/2019 1333   EOSABS 0.2 06/18/2019 1333   BASOSABS 0.0 06/18/2019 1333   CMP Latest Ref Rng & Units 07/05/2019 06/18/2019 01/27/2018  Glucose 65 - 99 mg/dL - - 78  BUN 6 - 20 mg/dL - - 7  Creatinine 0.44 - 1.00  mg/dL 0.80 - 0.66  Sodium 135 - 145 mmol/L - - 138  Potassium 3.5 - 5.1 mmol/L - - 3.1(L)  Chloride 101 - 111 mmol/L - - 102  CO2 22 - 32 mmol/L - - 22  Calcium 8.9 - 10.3 mg/dL - - 9.6  Total Protein 6.5 - 8.1 g/dL - 7.6 -  Total Bilirubin 0.3 - 1.2 mg/dL - 1.3(H) -  Alkaline Phos 38 - 126 U/L - 61 -  AST 15 - 41 U/L - 27 -  ALT 0 - 44 U/L - 14 -          ASSESSMENT & PLAN:   1. Tobacco abuse -This patient meets the criteriafor low dose CT  lung cancer screening. She isasymptomatic for any signs or symptoms of lung cancer. -The Shared Decision-Making Visit discussion included risks and benefits of screening, potential for follow up,diagnostic testing for abnormal scans, potential for false positive tests, over diagnosis, and discussion about total radiation exposure. -Patient stated willingness to undergo diagnostics and treatment as needed. -Patientwascounseled on smoking cessation to decreaserisk of lung cancer, pulmonary disease, heart disease and stroke. -Patientwas given a resource card with information on receiving free nicotine replacement therapy , and information about free smoking cessation classes. - Patient will present for her LDCT scan today and follow up with PCP.          [place] [phone]

## 2019-10-12 NOTE — Patient Instructions (Signed)
You were seen today for your shared decision making visit and a low-dose CT scan for lung cancer screening.    

## 2019-10-12 NOTE — Progress Notes (Signed)
Patient notified via telephone of LDCT lung cancer screening results with recommendations as below to follow up in 12 months.  Also notified of incidental findings and need to follow up with PCP.  Patient's referring provider was sent a copy of results.  Patient also requested that I mail her a copy of the results.     IMPRESSION: Lung-RADS 2, benign appearance or behavior. Continue annual screening with low-dose chest CT without contrast in 12 months.  Aortic Atherosclerosis (ICD10-I70.0) and Emphysema (ICD10-J43.9).

## 2019-10-15 ENCOUNTER — Ambulatory Visit: Payer: Medicaid Other | Admitting: Cardiology

## 2019-10-19 ENCOUNTER — Ambulatory Visit (HOSPITAL_COMMUNITY)
Admission: RE | Admit: 2019-10-19 | Discharge: 2019-10-19 | Disposition: A | Discharge status: Home / Self Care | Payer: Medicaid Other | Source: Ambulatory Visit | Attending: Nurse Practitioner | Admitting: Nurse Practitioner

## 2019-10-19 ENCOUNTER — Other Ambulatory Visit: Payer: Self-pay

## 2019-10-19 DIAGNOSIS — R1011 Right upper quadrant pain: Secondary | ICD-10-CM | POA: Diagnosis not present

## 2019-10-19 LAB — POCT I-STAT CREATININE: Creatinine, Ser: 0.8 mg/dL (ref 0.44–1.00)

## 2019-10-19 MED ORDER — GADOBUTROL 1 MMOL/ML IV SOLN
7.0000 mL | Freq: Once | INTRAVENOUS | Status: AC | PRN
  Administered 2019-10-19: 7 mL via INTRAVENOUS

## 2019-11-04 ENCOUNTER — Ambulatory Visit (HOSPITAL_COMMUNITY): Payer: Medicaid Other

## 2019-11-05 ENCOUNTER — Encounter: Payer: Self-pay | Admitting: Cardiology

## 2019-11-05 ENCOUNTER — Other Ambulatory Visit: Payer: Self-pay

## 2019-11-05 ENCOUNTER — Ambulatory Visit (INDEPENDENT_AMBULATORY_CARE_PROVIDER_SITE_OTHER): Payer: Medicaid Other | Admitting: Cardiology

## 2019-11-05 VITALS — BP 112/90 | HR 84 | Ht <= 58 in | Wt 136.0 lb

## 2019-11-05 DIAGNOSIS — E782 Mixed hyperlipidemia: Secondary | ICD-10-CM | POA: Diagnosis not present

## 2019-11-05 DIAGNOSIS — I25119 Atherosclerotic heart disease of native coronary artery with unspecified angina pectoris: Secondary | ICD-10-CM | POA: Diagnosis not present

## 2019-11-05 DIAGNOSIS — I10 Essential (primary) hypertension: Secondary | ICD-10-CM

## 2019-11-05 MED ORDER — NITROGLYCERIN 0.4 MG SL SUBL: 0.4000 mg | SUBLINGUAL_TABLET | SUBLINGUAL | 3 refills | Status: DC | PRN

## 2019-11-05 NOTE — Progress Notes (Signed)
Cardiology Office Note  Date: 11/05/2019   ID: [name], DOB 1961-03-17, MRN [id number]  PCP:  Sandi Mealy, MD  Cardiologist:  Rozann Lesches, MD Electrophysiologist:  None   Chief Complaint  Patient presents with  . Cardiac follow-up    History of Present Illness: Victoria Townsend is a 58 y.o. female last assessed via telehealth encounter in July with Ms. Delano Metz.  She presents for a follow-up visit today.  She describes multiple complaints and concerns as is often the case.  Her PCP is Dr. Lorra Hals.  From a cardiac perspective, she does describe intermittent chest pain, some of which is very atypical, left-sided breast soreness that is painful to touch, others sounds like it could be anginal.  We have been managing her for CAD evident by cardiac CTA done back in June.  She did not tolerate Imdur due to headaches.  Her current regimen includes aspirin, Lipitor, Ranexa, [name], and we are also prescribing sublingual nitroglycerin.  She has not had a recent lipid panel.  I discussed this with her today and we will get this arranged.  I personally reviewed her ECG today which shows normal sinus rhythm with R' in lead V1.  Past Medical History:  Diagnosis Date  . Agoraphobia   . Anxiety disorder   . Asthma   . Bladder polyps   . CAD (coronary artery disease)    Cardiac CTA June 2020  . Essential hypertension   . GERD (gastroesophageal reflux disease)   . Herpes   . Irregular heartbeat   . Nephrolithiasis   . Polysubstance abuse ([place])    History of cocaine, alcohol, marijuana in the past  . PTSD (post-traumatic stress disorder)   . Shoulder pain    Shoulder has been injected    Past Surgical History:  Procedure Laterality Date  . APPENDECTOMY    . Bladder polyps    . CESAREAN SECTION WITH BILATERAL TUBAL LIGATION    . CHOLECYSTECTOMY    . COLONOSCOPY  May 2012   Dr. Posey Pronto: scattered diverticula pancolonic  . COLONOSCOPY WITH PROPOFOL N/A 01/29/2018   Procedure: COLONOSCOPY WITH PROPOFOL;  Surgeon: Daneil Dolin, MD;  [facility];  Service: Endoscopy;  Laterality: N/A;  . ESOPHAGOGASTRODUODENOSCOPY  April 2012   Dr. Posey Pronto: gastritis  . ESOPHAGOGASTRODUODENOSCOPY N/A 08/09/2013   [id number] polyps/abnormal gastric because of uncertain significance-status post biopsy. Small hiatal PrismBlog.es chronic inflammation on bx. hyperplastic polyp  . ESOPHAGOGASTRODUODENOSCOPY (EGD) WITH PROPOFOL N/A 01/29/2018   Procedure: ESOPHAGOGASTRODUODENOSCOPY (EGD) WITH PROPOFOL;  Surgeon: Daneil Dolin, MD;  [facility]: [facility];  Service: Endoscopy;  Laterality: N/A;  1:30  . MALONEY DILATION N/A 01/29/2018   Procedure: Venia Minks DILATION;  Surgeon: Daneil Dolin, MD;  Location: [facility];  Service: Endoscopy;  Laterality: N/A;  . URETHRAL STRICTURE DILATATION    . WRIST FRACTURE SURGERY Right     Current Outpatient Medications  Medication Sig Dispense Refill  . acetaminophen (TYLENOL) 500 MG tablet Take 1,000 mg by mouth every 8 (eight) hours as needed for moderate pain.     Marland Kitchen albuterol (VENTOLIN HFA) 108 (90 BASE) MCG/ACT inhaler Inhale 2 puffs into the lungs every 6 (six) hours as needed for shortness of breath.     . ALPRAZolam (XANAX) 1 MG tablet Take 1 mg by mouth 4 (four) times daily.     Marland Kitchen aspirin (ASPIR-LOW) 81 MG EC tablet Take 81 mg by mouth daily as needed (for chest  pain).     . atorvastatin (LIPITOR) 10 MG tablet Take 1 tablet by mouth at bedtime.    . clobetasol (TEMOVATE) 0.05 % cream Apply 1 application topically daily as needed (for genital itching).     Marland Kitchen dicyclomine (BENTYL) 10 MG capsule Take 1 capsule (10 mg total) by mouth 2 (two) times daily as needed for spasms (abdominal pain). 30 capsule 0  . gabapentin (NEURONTIN) 300 MG capsule Take 300 mg by mouth 2 (two) times daily. May take a 3rd dose as needed for pain    . hydrocortisone 2.5 % ointment APPLY AS DIRECTED DAILY AS NEEDED FOR ITCHING. 28.35 g 2  .  oxybutynin (DITROPAN) 5 MG tablet Take 2.5 mg by mouth as needed.   3  . pantoprazole (PROTONIX) 40 MG tablet Take 40 mg by mouth 2 (two) times daily.     Marland Kitchen RANEXA 500 MG 12 hr tablet TAKE ONE TABLET BY MOUTH TWICE DAILY 60 tablet 6  . valACYclovir (VALTREX) 1000 MG tablet Take 500 mg by mouth daily.     . verapamil (CALAN-SR) 240 MG CR tablet Take 240 mg by mouth daily.     No current facility-administered medications for this visit.   Allergies:  Ciprofloxacin, Codeine, Ibuprofen, Imdur [isosorbide nitrate], Mucinex [guaifenesin er], Roxicodone [oxycodone], Erythromycin, and Shellfish allergy   Social History: The patient  reports that she has been smoking cigarettes. She has a 45.00 pack-year smoking history. She has never used smokeless tobacco. She reports previous drug use. Drugs: Marijuana and Cocaine. She reports that she does not drink alcohol.   ROS:  Please see the history of present illness. Otherwise, complete review of systems is positive for neuropathic pain and paresthesias in the left leg, chronic back pain, arthritic pain in her hands.  All other systems are reviewed and negative.   Physical Exam: VS:  BP 112/90   Pulse 84   Ht 4\' 10"  (1.473 m)   Wt 136 lb (61.7 kg)   SpO2 98%   BMI 28.42 kg/m , BMI Body mass index is 28.42 kg/m.  Wt Readings from Last 3 Encounters:  11/05/19 136 lb (61.7 kg)  09/02/19 138 lb 3.2 oz (62.7 kg)  08/03/19 137 lb 14.4 oz (62.6 kg)    General: Patient appears comfortable at rest. HEENT: Conjunctiva and lids normal, wearing a mask. Neck: Supple, no elevated JVP or carotid bruits, no thyromegaly. Lungs: Clear to auscultation, nonlabored breathing at rest. Cardiac: Regular rate and rhythm, no S3, soft systolic murmur. Abdomen: Soft, nontender, bowel sounds present. Extremities: No pitting edema, distal pulses 2+. Skin: Warm and dry. Musculoskeletal: No kyphosis.  Arthritic joint changes involving both hands. Neuropsychiatric: Alert and  oriented x3, affect grossly appropriate.  ECG:  An ECG dated 01/27/2018 was personally reviewed today and demonstrated:  Sinus rhythm with nonspecific ST changes.  Recent Labwork: 06/18/2019: ALT 14; AST 27; Hemoglobin 13.1; Platelets 251 10/19/2019: Creatinine, Ser 0.80   Other Studies Reviewed Today:  Cardiac CTA 05/20/2019: FINDINGS: Coronary calcium score: The patient's coronary artery calcium score is 443, which places the patient in the 99 percentile.  Coronary arteries: Normal coronary origins.  Right dominance.  Right Coronary Artery: Mild mixed atherosclerotic plaque in the proximal RCA, 25-49% stenosis. Moderate mixed atherosclerotic plaque in the mid RCA, 50-69%. Severe mixed atherosclerotic plaque in the distal RCA, 70-99% stenosis. Patent PDA and PL with diffuse minimal plaque.  Left Main Coronary Artery: No detectable plaque or stenosis.  Left Anterior Descending Coronary Artery: Mild  mixed atherosclerotic plaque in the proximal LAD, 25-49% stenosis. Moderate mixed atherosclerotic plaque in the mid LAD, 50-69%. Moderate mixed atherosclerotic plaque in the distal LAD 50-69%. Patent diagonal branches, mild mixed atherosclerotic plaque in the mid LAD at the takeoff of the first diagonal branch.  Left Circumflex Artery: Moderate mixed atherosclerotic plaque in the proximal L circumflex 50-69%. Possible severe atherosclerotic plaque in the proximal-distal L circumflex, 70-99%, at the bifurcation of medium caliber OM2. OM1 is small caliber but patent.  Aorta: Normal size, 26 mm at the mid ascending aorta (level of the PA bifurcation) measured double oblique. No calcifications. No dissection.  Aortic Valve: No calcifications.  Other findings:  Normal pulmonary vein drainage into the left atrium.  Normal left atrial appendage without a thrombus.  Normal size of the pulmonary artery.  IMPRESSION: 1. Severe CAD in the distal RCA and proximal-distal  circumflex artery, CADRADS = 4. CT FFR analysis will be performed and reported separately.  2. The patient's coronary artery calcium score is 443, which places the patient in the 22 percentile for age and sex matched control.  3. Normal coronary origin with right dominance.  Cardiac CT FFR 05/20/2019: 1. Left Main:  No significant stenosis. FFR = 0.97  2. LAD: Proximal FFR = 0.95, Mid FFR = 0.81, Distal FFR = 0.73 3. LCX: Proximal FFR = 0.94, Distal FFR = 0.85, OM 2 = 0.84 4. RCA: Proximal FFR = 0.96, Mid FFR =0.87, Distal FFR = 0.84, PDA not mapped  IMPRESSION: 1. CT FFR analysis demonstrates possible hemodynamically significant lesion in the distal LAD. Vessel diameter just proximal to this lesion is 2.65 mm. Consider aggressive medical management for distal CAD.  Carotid Dopplers 06/02/2019: Summary: Right Carotid: Velocities in the right ICA are consistent with a 1-39% stenosis.  Left Carotid: Velocities in the left ICA are consistent with a 1-39% stenosis.  Vertebrals:  Bilateral vertebral arteries demonstrate antegrade flow. Subclavians: Normal flow hemodynamics were seen in bilateral subclavian              arteries.  Assessment and Plan:  1.  CAD documented by chest CTA, predominantly distal LAD and RCA distribution with CT FFR of the LAD abnormal and distal distribution but in a small vessel segment with plan for medical therapy.  She did not tolerate Imdur due to headaches.  Continue aspirin, [name], Ranexa, and Lipitor.  Refill also given for nitroglycerin.  2.  Hyperlipidemia, currently on low-dose Lipitor.  Check FLP and LFTs.  3.  Mild carotid artery atherosclerosis by Dopplers in July.  She is asymptomatic.  Continue aspirin and statin.  Medication Adjustments/Labs and Tests Ordered: Current medicines are reviewed at length with the patient today.  Concerns regarding medicines are outlined above.   Tests Ordered: Orders Placed This Encounter    Procedures  . EKG 12-Lead    Medication Changes: No orders of the defined types were placed in this encounter.   Disposition:  Follow up 6 months in the [place] office.  Signed, Satira Sark, MD, [facility] 11/05/2019 10:19 AM    [facility] [place], [place],  [address] Phone: [phone]; Fax: [phone]

## 2019-11-05 NOTE — Patient Instructions (Addendum)
Medication Instructions:   Nitroglycerin - may be used as needed for severe chest pain only.  New sent to pharmacy today.   Continue all other medications.    Labwork:  Lipids   Liver function  Orders given today.   Reminder:  Nothing to eat or drink after 12 midnight prior to labs.  Office will contact with results via phone or letter.    Testing/Procedures: none  Follow-Up: 6 months   Any Other Special Instructions Will Be Listed Below (If Applicable).  If you need a refill on your cardiac medications before your next appointment, please call your pharmacy.

## 2019-11-11 ENCOUNTER — Telehealth: Payer: Self-pay | Admitting: *Deleted

## 2019-11-11 NOTE — Telephone Encounter (Signed)
Patient informed. Copy sent to PCP °

## 2019-11-11 NOTE — Telephone Encounter (Signed)
-----   Message from Satira Sark, MD sent at 11/10/2019 10:43 AM EST ----- Results reviewed.  LFTs are normal.  LDL cholesterol 76.  Continue current dose of Lipitor.

## 2019-11-22 ENCOUNTER — Telehealth: Payer: Self-pay | Admitting: Cardiology

## 2019-11-22 NOTE — Telephone Encounter (Signed)
Mailbox full

## 2019-11-22 NOTE — Telephone Encounter (Signed)
Patient called stating that she thinks she is having reaction to the Ranexa 500 mg.

## 2019-11-30 NOTE — Telephone Encounter (Signed)
Patient notified and verbalized understanding. 

## 2019-11-30 NOTE — Telephone Encounter (Signed)
Would stay off of the Ranexa for now if she is feeling better.  It is difficult to know whether the symptoms are all related, particularly if she has arthritic hand changes as well.

## 2019-11-30 NOTE — Telephone Encounter (Addendum)
Dry mouth, both hands feel tingly & numb - mostly at night & feels like this upon waking.  Does have most days, swelling lips also.  Noticed this x last several weeks.  She states that she felt like she didn't have any problems when she was on the brand name, but noticed a change when she went to the generic.  Stated she has not been taking x 2 weeks - states that she is feeling better with the exception of the hands still being the same.  Does have arthritis knots on her hands as well which does cause some swelling in her hands too.  States that she does not have the chest pain every day, but she does notice a difference between the generic & brand.

## 2019-11-30 NOTE — Addendum Note (Signed)
Addended by: Laurine Blazer on: 11/30/2019 04:29 PM   Modules accepted: Orders

## 2019-12-02 ENCOUNTER — Telehealth: Payer: Self-pay | Admitting: *Deleted

## 2019-12-02 NOTE — Telephone Encounter (Signed)
Pt states that symptoms did get better after stopping Ranexa but is now worse. Pt does not want to restart Ranexa at this time.

## 2019-12-02 NOTE — Telephone Encounter (Addendum)
Pt woke at 4 am with chest pain, weakness, and SOB. States that she is having numbness in arms and fingers. Denies chest pain at this time. No nausea. Pt states that she has SOB that is helped with in hailer. Pt c/o back pain and leg pain.  Pt seen by PCP today. Please advise

## 2019-12-02 NOTE — Telephone Encounter (Signed)
RETURNED CALL

## 2019-12-02 NOTE — Telephone Encounter (Signed)
    Patient examined by Dr. Domenic Polite in 10/2019 and reported multiple somatic complaints. Appears she called back on 1/5 reporting symptoms along her hands but had stopped Ranexa as she thought this was contributing to her symptoms. Did her symptoms improve off of Ranexa? If not, would recommend resuming Ranexa given her recurrent chest pain. Intolerant to Imdur in the past. Agree with PCP evaluation given her variety of symptoms.   Signed, Erma Heritage, PA-C 12/02/2019, 3:59 PM Pager: [phone]

## 2019-12-07 ENCOUNTER — Telehealth: Payer: Self-pay | Admitting: Cardiology

## 2019-12-07 ENCOUNTER — Encounter (HOSPITAL_COMMUNITY): Payer: Medicaid Other

## 2019-12-07 NOTE — Telephone Encounter (Signed)
Reports intermittent stinging on left side of neck for 4 days. Reports pain, tingling and stinging in right and left elbow and fingers. Reports right hand felt stiff this morning.  Denies chest pain, dizziness or sob. Patient is requesting a prednisone prescription to treat her symptoms. Says prednisone helped her in the past. Advised to contact her PCP and discuss symptoms and prednisone request.  Verbalized understanding of plan.

## 2019-12-07 NOTE — Telephone Encounter (Signed)
Patient called stating that she is having pain on the right side of her neck radiating into her arms and fingers.

## 2019-12-14 ENCOUNTER — Encounter (HOSPITAL_COMMUNITY): Payer: Medicaid Other

## 2020-01-27 ENCOUNTER — Other Ambulatory Visit: Payer: Self-pay | Admitting: Otolaryngology

## 2020-01-27 ENCOUNTER — Other Ambulatory Visit (HOSPITAL_COMMUNITY): Payer: Self-pay | Admitting: Otolaryngology

## 2020-01-27 DIAGNOSIS — J387 Other diseases of larynx: Secondary | ICD-10-CM

## 2020-01-27 DIAGNOSIS — Z87891 Personal history of nicotine dependence: Secondary | ICD-10-CM

## 2020-02-03 ENCOUNTER — Other Ambulatory Visit (HOSPITAL_COMMUNITY): Payer: Self-pay | Admitting: Otolaryngology

## 2020-02-03 DIAGNOSIS — R070 Pain in throat: Secondary | ICD-10-CM

## 2020-02-03 DIAGNOSIS — Z87891 Personal history of nicotine dependence: Secondary | ICD-10-CM

## 2020-02-03 DIAGNOSIS — J387 Other diseases of larynx: Secondary | ICD-10-CM

## 2020-02-10 ENCOUNTER — Encounter: Payer: Self-pay | Admitting: Internal Medicine

## 2020-02-14 ENCOUNTER — Encounter (HOSPITAL_COMMUNITY): Payer: Self-pay

## 2020-02-14 ENCOUNTER — Ambulatory Visit (HOSPITAL_COMMUNITY): Payer: Medicaid Other

## 2020-02-14 ENCOUNTER — Other Ambulatory Visit: Payer: Self-pay

## 2020-02-14 ENCOUNTER — Ambulatory Visit (HOSPITAL_COMMUNITY)
Admission: RE | Admit: 2020-02-14 | Discharge: 2020-02-14 | Disposition: A | Discharge status: Home / Self Care | Payer: Medicaid Other | Source: Ambulatory Visit | Attending: Otolaryngology | Admitting: Otolaryngology

## 2020-02-14 DIAGNOSIS — J387 Other diseases of larynx: Secondary | ICD-10-CM | POA: Diagnosis present

## 2020-02-14 DIAGNOSIS — R070 Pain in throat: Secondary | ICD-10-CM

## 2020-02-14 DIAGNOSIS — Z87891 Personal history of nicotine dependence: Secondary | ICD-10-CM | POA: Diagnosis present

## 2020-02-14 LAB — POCT I-STAT CREATININE: Creatinine, Ser: 0.8 mg/dL (ref 0.44–1.00)

## 2020-02-14 MED ORDER — IOHEXOL 300 MG/ML  SOLN
75.0000 mL | Freq: Once | INTRAMUSCULAR | Status: AC | PRN
  Administered 2020-02-14: 75 mL via INTRAVENOUS

## 2020-02-19 IMAGING — CT CT ABDOMEN AND PELVIS WITH CONTRAST
2 of 4 series · 16 of 46 positions shown, 18 images · IV contrast (omnipaque)
Comparison: 10/19/2018

CLINICAL DATA: Upper abdominal pain x1 year, prior cholecystectomy
and appendectomy

EXAM:
CT ABDOMEN AND PELVIS WITH CONTRAST
TECHNIQUE: Multidetector CT imaging of the abdomen and pelvis was performed
using the standard protocol following bolus administration of
intravenous contrast.
CONTRAST:  100mL OMNIPAQUE IOHEXOL 300 MG/ML  SOLN

[Series 2: axial st · axial · 0.80mm/px · z∈[+855,+1210]mm · 13 of 79 slices shown, 15 images]
[im 4/79  soft-tissue]
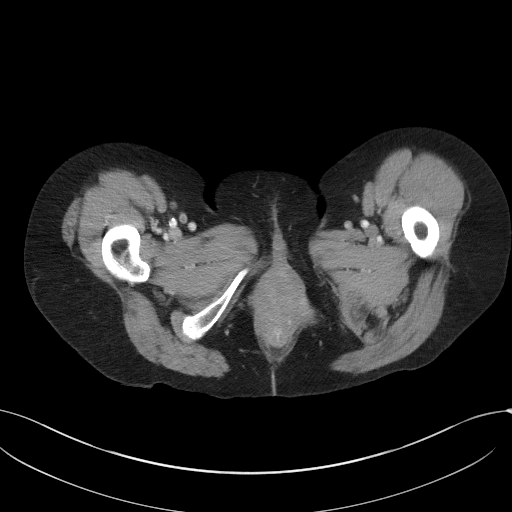
[im 4/79  bone]
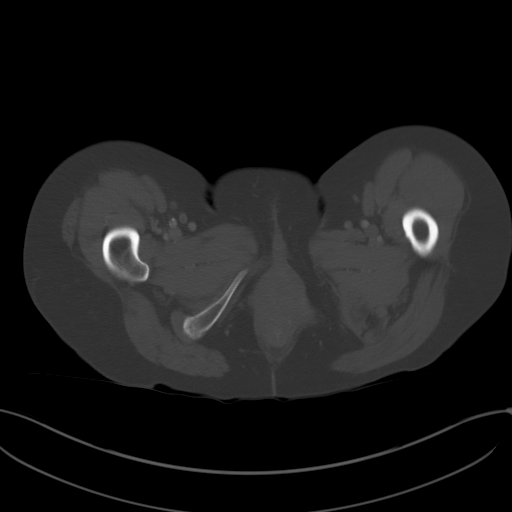
[im 11/79  soft-tissue]
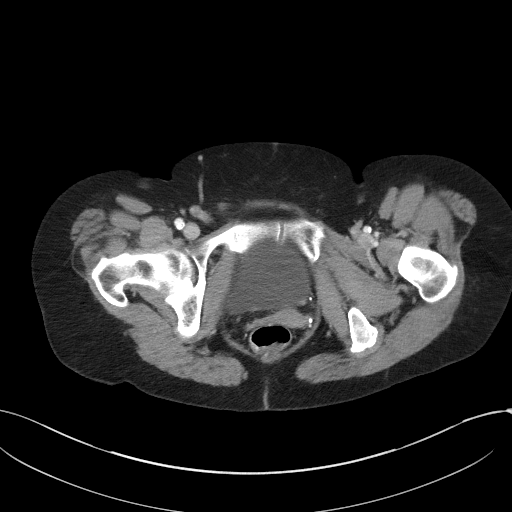
[im 18/79  soft-tissue]
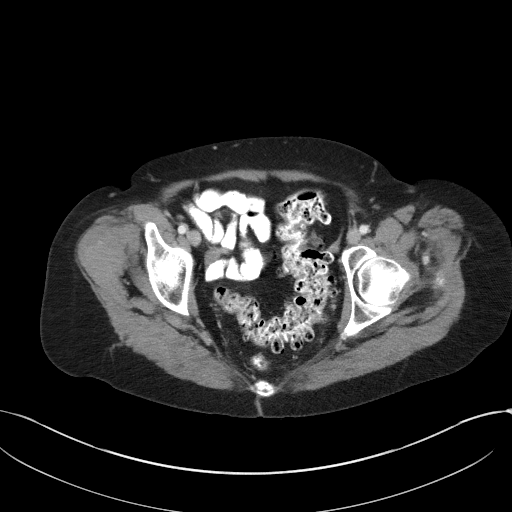
[im 22/79  soft-tissue]
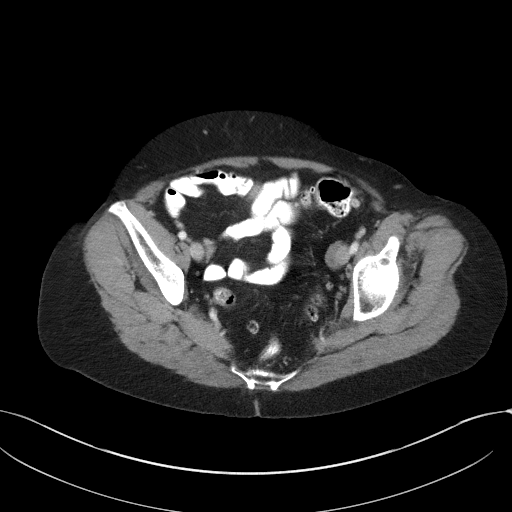
[im 29/79  soft-tissue]
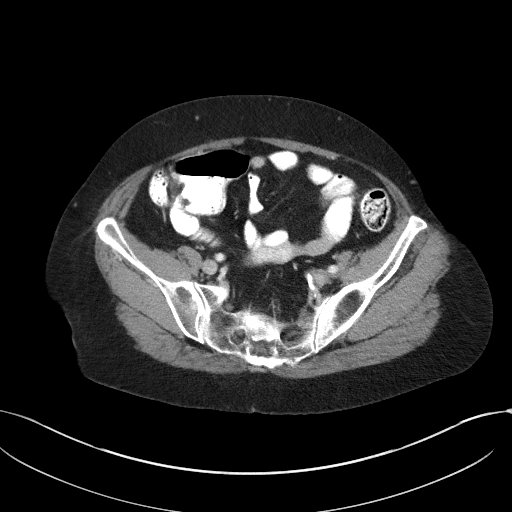
[im 32/79  soft-tissue]
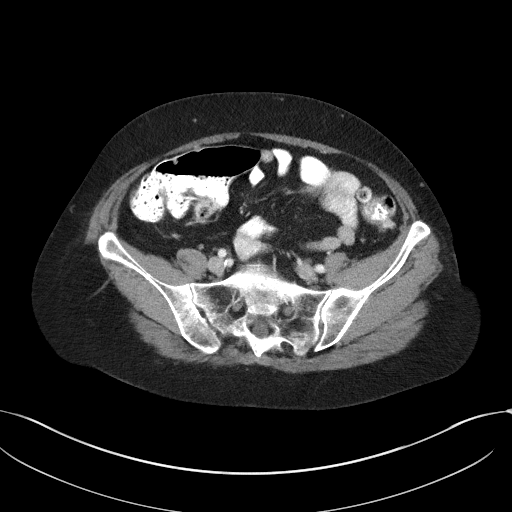
[im 40/79  soft-tissue]
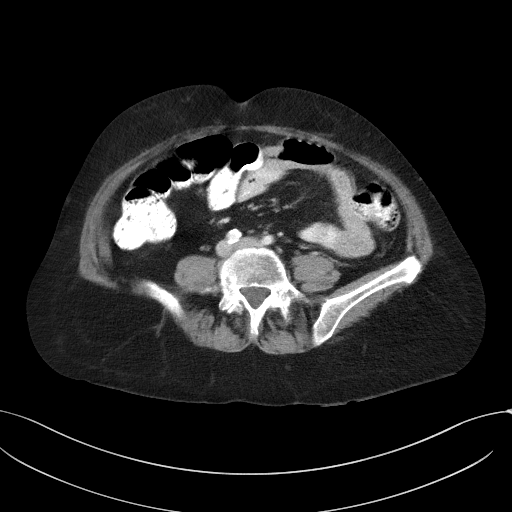
[im 47/79  soft-tissue]
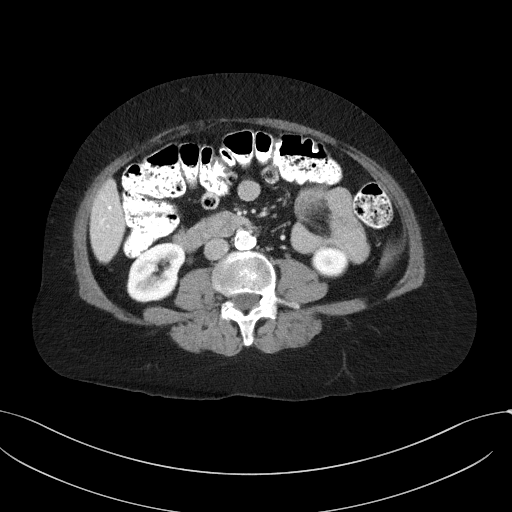
[im 50/79  soft-tissue]
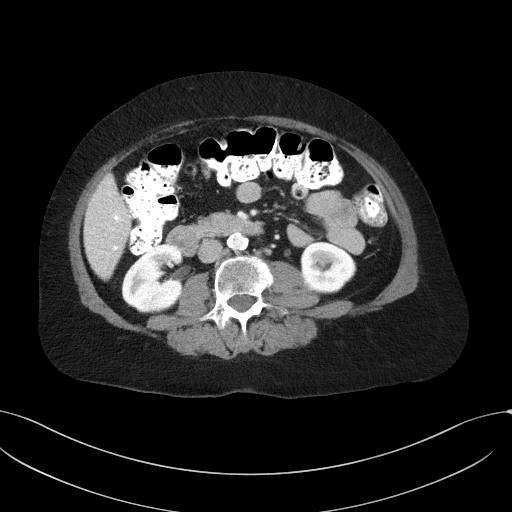
[im 50/79  bone]
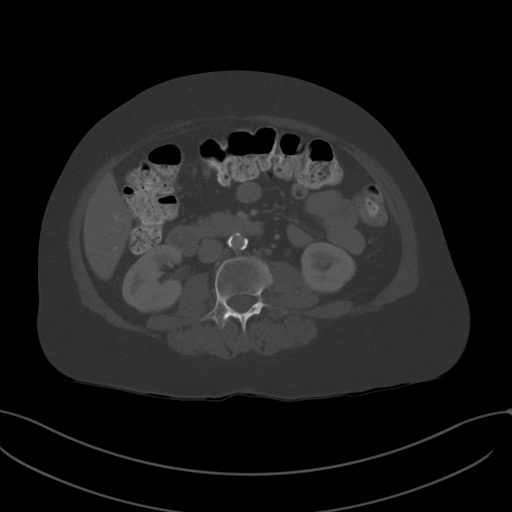
[im 57/79  soft-tissue]
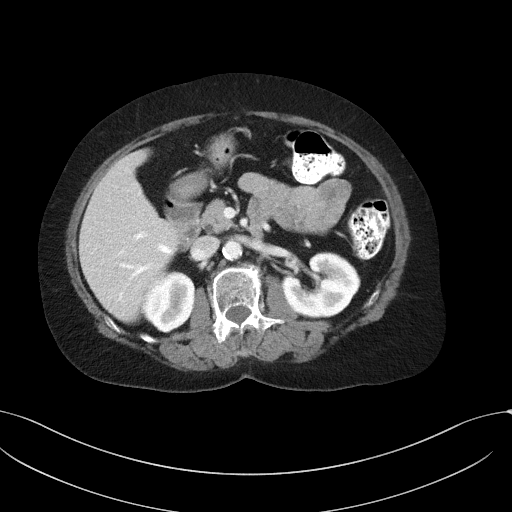
[im 61/79  soft-tissue]
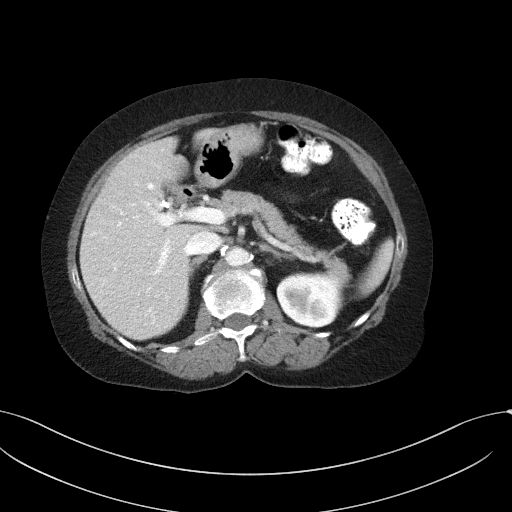
[im 68/79  soft-tissue]
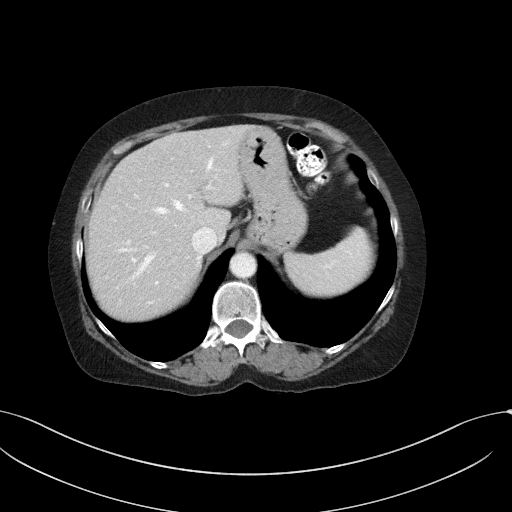
[im 75/79  soft-tissue]
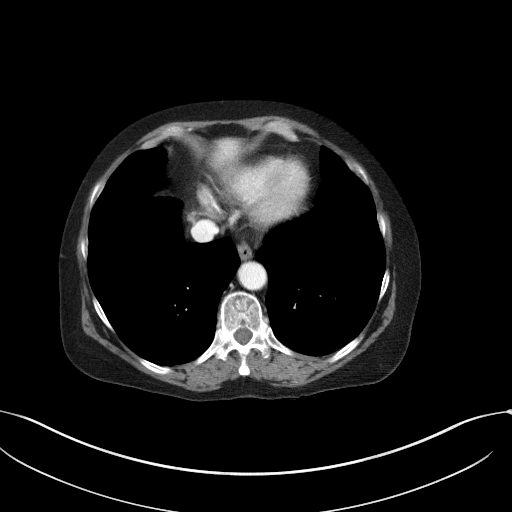

[Series 6: coronal st · coronal · 0.68mm/px · 3 of 84 slices shown]
[im 28/84  soft-tissue]
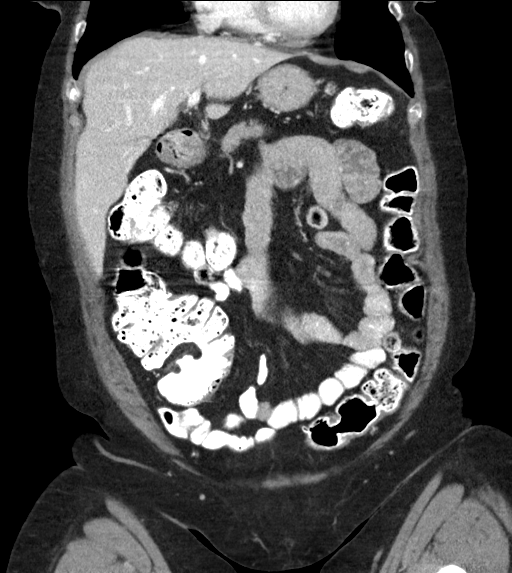
[im 37/84  soft-tissue]
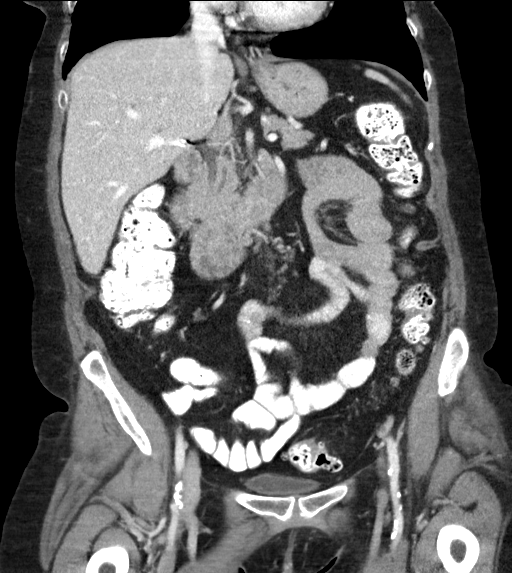
[im 47/84  soft-tissue]
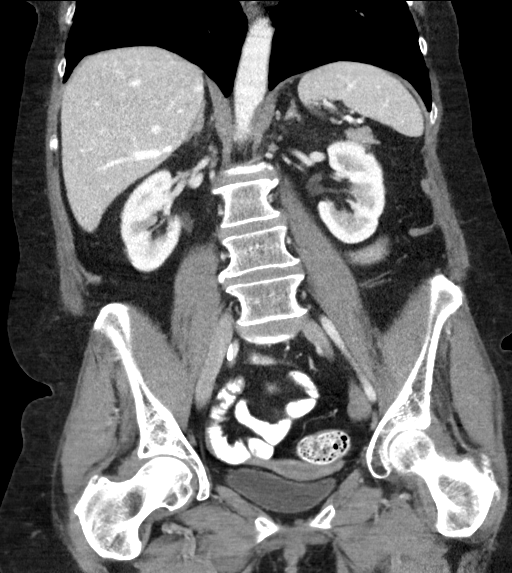

[16 of 46 positions shown; findings below may reference images not displayed]

FINDINGS: Lower chest: Lung bases are clear.

Hepatobiliary: Liver is within normal limits, noting focal
fat/altered perfusion along the falciform ligament.

Status post cholecystectomy. No intrahepatic or extrahepatic ductal
dilatation.

Pancreas: Within normal limits.

Spleen: Within normal limits.

Adrenals/Urinary Tract: Adrenal glands are within normal limits.

3 mm nonobstructing left lower pole renal calculus (series 2/image
28). 3 mm nonobstructing right lower pole renal calculus (series
2/image 27). Three subcentimeter left renal cysts, unchanged. No
hydronephrosis.

Bladder is within normal limits.

Stomach/Bowel: Stomach is within normal limits.

No evidence of bowel obstruction.

Prior appendectomy.

Sigmoid diverticulosis, without evidence of diverticulitis.

Vascular/Lymphatic: No evidence of abdominal aortic aneurysm.

Atherosclerotic calcifications of the abdominal aorta and branch
vessels.

No suspicious abdominopelvic lymphadenopathy.

Reproductive: Uterus is within normal limits.

Bilateral ovaries are within normal limits.

Other: No abdominopelvic ascites.

Musculoskeletal: Mild degenerative changes of the visualized
thoracolumbar spine.
IMPRESSION: Two nonobstructing bilateral renal calculi measuring up to 3 mm. No
hydronephrosis.

Status post cholecystectomy and appendectomy. No evidence of bowel
obstruction.

No CT findings to account for the patient's chronic upper abdominal
pain.

## 2020-03-02 ENCOUNTER — Ambulatory Visit (INDEPENDENT_AMBULATORY_CARE_PROVIDER_SITE_OTHER): Payer: Medicaid Other | Admitting: Nurse Practitioner

## 2020-04-03 ENCOUNTER — Institutional Professional Consult (permissible substitution): Payer: Medicaid Other | Admitting: Internal Medicine

## 2020-04-10 ENCOUNTER — Ambulatory Visit (INDEPENDENT_AMBULATORY_CARE_PROVIDER_SITE_OTHER): Payer: Medicaid Other | Admitting: Internal Medicine

## 2020-05-05 ENCOUNTER — Other Ambulatory Visit (HOSPITAL_COMMUNITY)
Admission: RE | Admit: 2020-05-05 | Discharge: 2020-05-05 | Disposition: A | Discharge status: Home / Self Care | Payer: Medicaid Other | Source: Ambulatory Visit | Attending: Family Medicine | Admitting: Family Medicine

## 2020-05-09 ENCOUNTER — Institutional Professional Consult (permissible substitution): Payer: Medicaid Other | Admitting: Internal Medicine

## 2020-05-12 ENCOUNTER — Institutional Professional Consult (permissible substitution): Payer: Medicaid Other | Admitting: Internal Medicine

## 2020-05-16 ENCOUNTER — Ambulatory Visit: Payer: Medicaid Other | Admitting: Cardiology

## 2020-05-19 ENCOUNTER — Ambulatory Visit: Payer: Medicaid Other | Admitting: Cardiology

## 2020-05-25 ENCOUNTER — Ambulatory Visit: Payer: Medicaid Other | Admitting: Cardiology

## 2020-06-01 ENCOUNTER — Telehealth: Payer: Self-pay | Admitting: Internal Medicine

## 2020-06-01 NOTE — Telephone Encounter (Signed)
Called and spoke with patient about getting covid test done for PFT on 7/27. She states she is getting it done on 7/23 at some building across from [facility]. Informed her that she either needs to have them fax over results or she needs to bring them with her to her breathing test. Provided her with fax number. Patient expressed understanding. Nothing further needed at this time.

## 2020-06-13 ENCOUNTER — Ambulatory Visit (INDEPENDENT_AMBULATORY_CARE_PROVIDER_SITE_OTHER): Payer: Medicaid Other | Admitting: Cardiology

## 2020-06-13 ENCOUNTER — Encounter: Payer: Self-pay | Admitting: Cardiology

## 2020-06-13 VITALS — BP 146/70 | HR 62 | Ht <= 58 in | Wt 145.8 lb

## 2020-06-13 DIAGNOSIS — I25119 Atherosclerotic heart disease of native coronary artery with unspecified angina pectoris: Secondary | ICD-10-CM | POA: Diagnosis not present

## 2020-06-13 DIAGNOSIS — E782 Mixed hyperlipidemia: Secondary | ICD-10-CM | POA: Diagnosis not present

## 2020-06-13 NOTE — Patient Instructions (Addendum)

## 2020-06-13 NOTE — Progress Notes (Signed)
Cardiology Office Note  Date: 06/13/2020   ID: Victoria, Townsend 09-30-1961, MRN [id number]  PCP:  Victoria Rio, MD  Cardiologist:  Victoria Lesches, MD Electrophysiologist:  None   Chief Complaint  Patient presents with  . Cardiac follow-up    History of Present Illness: Victoria Townsend is a 59 y.o. female last seen in December 2020.  She presents for a follow-up visit.  Since last evaluation she has come off Ranexa, cannot entirely recall why she stopped it.  She states that she does have intermittent chest discomfort, sometimes uses sublingual nitroglycerin, sometimes takes an extra aspirin.  She does not describe a clear, reproducible exertional chest pain however.  She remains functional with ADLs including housework, also walks her dog up to a mile at a time.  Follow-up lab work from December 2020 is outlined below.  LDL was 76.  Past Medical History:  Diagnosis Date  . Agoraphobia   . Anxiety disorder   . Asthma   . Bladder polyps   . CAD (coronary artery disease)    Cardiac CTA June 2020  . Essential hypertension   . GERD (gastroesophageal reflux disease)   . Herpes   . Irregular heartbeat   . Nephrolithiasis   . Polysubstance abuse ([place])    History of cocaine, alcohol, marijuana in the past  . PTSD (post-traumatic stress disorder)   . Shoulder pain    Shoulder has been injected    Past Surgical History:  Procedure Laterality Date  . APPENDECTOMY    . Bladder polyps    . CESAREAN SECTION WITH BILATERAL TUBAL LIGATION    . CHOLECYSTECTOMY    . COLONOSCOPY  May 2012   Dr. Posey Townsend: scattered diverticula pancolonic  . COLONOSCOPY WITH PROPOFOL N/A 01/29/2018   Procedure: COLONOSCOPY WITH PROPOFOL;  Surgeon: Victoria Dolin, MD;  [facility];  Service: Endoscopy;  Laterality: N/A;  . ESOPHAGOGASTRODUODENOSCOPY  April 2012   Dr. Posey Townsend: gastritis  . ESOPHAGOGASTRODUODENOSCOPY N/A 08/09/2013   PYK:DXIPJAS polyps/abnormal gastric because of uncertain  significance-status post biopsy. Small hiatal PrismBlog.es chronic inflammation on bx. hyperplastic polyp  . ESOPHAGOGASTRODUODENOSCOPY (EGD) WITH PROPOFOL N/A 01/29/2018   Procedure: ESOPHAGOGASTRODUODENOSCOPY (EGD) WITH PROPOFOL;  Surgeon: Victoria Dolin, MD;  [facility];  Service: Endoscopy;  Laterality: N/A;  1:30  . MALONEY DILATION N/A 01/29/2018   Procedure: Venia Minks DILATION;  Surgeon: Victoria Dolin, MD;  [facility];  Service: Endoscopy;  Laterality: N/A;  . URETHRAL STRICTURE DILATATION    . WRIST FRACTURE SURGERY Right     Current Outpatient Medications  Medication Sig Dispense Refill  . acetaminophen (TYLENOL) 500 MG tablet Take 1,000 mg by mouth every 8 (eight) hours as needed for moderate pain.     Marland Kitchen albuterol (VENTOLIN HFA) 108 (90 BASE) MCG/ACT inhaler Inhale 2 puffs into the lungs every 6 (six) hours as needed for shortness of breath.     . ALPRAZolam (XANAX) 1 MG tablet Take 1 mg by mouth 4 (four) times daily.     Marland Kitchen aspirin (ASPIR-LOW) 81 MG EC tablet Take 81 mg by mouth daily as needed (for chest pain).     Marland Kitchen atorvastatin (LIPITOR) 10 MG tablet Take 1 tablet by mouth at bedtime.    . clobetasol (TEMOVATE) 0.05 % cream Apply 1 application topically daily as needed (for genital itching).     . gabapentin (NEURONTIN) 400 MG capsule Take 400 mg by mouth 3 (three) times daily.    Marland Kitchen  hydrocortisone 2.5 % ointment APPLY AS DIRECTED DAILY AS NEEDED FOR ITCHING. 28.35 g 2  . lidocaine (LIDODERM) 5 % Place 1 patch onto the skin every 12 (twelve) hours.    . nitroGLYCERIN (NITROSTAT) 0.4 MG SL tablet Place 1 tablet (0.4 mg total) under the tongue every 5 (five) minutes as needed for chest pain. 25 tablet 3  . oxybutynin (DITROPAN) 5 MG tablet Take 2.5 mg by mouth as needed.   3  . pantoprazole (PROTONIX) 20 MG tablet Take 20 mg by mouth daily.    . RESTASIS 0.05 % ophthalmic emulsion Place 1 drop into both eyes 2 (two) times daily.    . valACYclovir (VALTREX) 1000  MG tablet Take 500 mg by mouth daily.     . verapamil (CALAN-SR) 240 MG CR tablet Take 240 mg by mouth daily.     No current facility-administered medications for this visit.   Allergies:  Ciprofloxacin, Codeine, Ibuprofen, Imdur [isosorbide nitrate], Mucinex [guaifenesin er], Roxicodone [oxycodone], Erythromycin, and Shellfish allergy   ROS:   Chronic pains in her left shoulder and arm, left hip.  Physical Exam: VS:  BP (!) 146/70   Pulse 62   Ht 4\' 10"  (1.473 m)   Wt 145 lb 12.8 oz (66.1 kg)   SpO2 98%   BMI 30.47 kg/m , BMI Body mass index is 30.47 kg/m.  Wt Readings from Last 3 Encounters:  06/13/20 145 lb 12.8 oz (66.1 kg)  11/05/19 136 lb (61.7 kg)  09/02/19 138 lb 3.2 oz (62.7 kg)    General: Patient appears comfortable at rest. HEENT: Conjunctiva and lids normal, wearing a mask. Neck: Supple, no elevated JVP or carotid bruits, no thyromegaly. Lungs: Clear to auscultation, nonlabored breathing at rest. Cardiac: Regular rate and rhythm, no S3 or significant systolic murmur, no pericardial rub. Extremities: No pitting edema, distal pulses 2+.  ECG:  An ECG dated 11/05/2019 was personally reviewed today and demonstrated:  Sinus rhythm with R' in lead V1.  Recent Labwork: 06/18/2019: ALT 14; AST 27; Hemoglobin 13.1; Platelets 251 02/14/2020: Creatinine, Ser 0.80  December 2020: AST 19, ALT 9, cholesterol 178, triglycerides 159, HDL 71, LDL 76  Other Studies Reviewed Today:  Cardiac CTA 05/20/2019: FINDINGS: Coronary calcium score: The patient's coronary artery calcium score is 443, which places the patient in the 99 percentile.  Coronary arteries: Normal coronary origins. Right dominance.  Right Coronary Artery: Mild mixed atherosclerotic plaque in the proximal RCA, 25-49% stenosis. Moderate mixed atherosclerotic plaque in the mid RCA, 50-69%. Severe mixed atherosclerotic plaque in the distal RCA, 70-99% stenosis. Patent PDA and PL with diffuse  minimal plaque.  Left Main Coronary Artery: No detectable plaque or stenosis.  Left Anterior Descending Coronary Artery: Mild mixed atherosclerotic plaque in the proximal LAD, 25-49% stenosis. Moderate mixed atherosclerotic plaque in the mid LAD, 50-69%. Moderate mixed atherosclerotic plaque in the distal LAD 50-69%. Patent diagonal branches, mild mixed atherosclerotic plaque in the mid LAD at the takeoff of the first diagonal branch.  Left Circumflex Artery: Moderate mixed atherosclerotic plaque in the proximal L circumflex 50-69%. Possible severe atherosclerotic plaque in the proximal-distal L circumflex, 70-99%, at the bifurcation of medium caliber OM2. OM1 is small caliber but patent.  Aorta: Normal size, 26 mm at the mid ascending aorta (level of the PA bifurcation) measured double oblique. No calcifications. No dissection.  Aortic Valve: No calcifications.  Other findings:  Normal pulmonary vein drainage into the left atrium.  Normal left atrial appendage without a thrombus.  Normal  size of the pulmonary artery.  IMPRESSION: 1. Severe CAD in the distal RCA and proximal-distal circumflex artery, CADRADS = 4. CT FFR analysis will be performed and reported separately.  2. The patient's coronary artery calcium score is 443, which places the patient in the 42 percentile for age and sex matched control.  3. Normal coronary origin with right dominance.  Cardiac CT FFR 05/20/2019: 1. Left Main: No significant stenosis. FFR = 0.97  2. LAD: Proximal FFR = 0.95, Mid FFR = 0.81, Distal FFR = 0.73 3. LCX: Proximal FFR = 0.94, Distal FFR = 0.85, OM 2 = 0.84 4. RCA: Proximal FFR = 0.96, Mid FFR =0.87, Distal FFR = 0.84, PDA not mapped  IMPRESSION: 1. CT FFR analysis demonstrates possible hemodynamically significant lesion in the distal LAD. Vessel diameter just proximal to this lesion is 2.65 mm. Consider aggressive medical management for  distal CAD.  Assessment and Plan:  1.  CAD as outlined by chest CTA results above, being managed medically with mainly distal LAD and RCA disease.  She is currently on aspirin, [name], Lipitor, and uses as needed nitroglycerin.  I reminded her to get a refill.  She stopped Ranexa in the interim, cannot entirely recall why.  She had been tolerating it previously.  Continue with observation.  2.  Mixed hyperlipidemia, remains on Lipitor with last LDL 76.  Medication Adjustments/Labs and Tests Ordered: Current medicines are reviewed at length with the patient today.  Concerns regarding medicines are outlined above.   Tests Ordered: No orders of the defined types were placed in this encounter.   Medication Changes: No orders of the defined types were placed in this encounter.   Disposition:  Follow up 6 months in the [place] office.  Signed, Satira Sark, MD, [facility] 06/13/2020 1:45 PM    [facility] [place], [place], [place] [address] Phone: [phone]; Fax: [phone]

## 2020-06-19 ENCOUNTER — Institutional Professional Consult (permissible substitution): Payer: Medicaid Other | Admitting: Internal Medicine

## 2020-06-20 ENCOUNTER — Other Ambulatory Visit: Payer: Self-pay

## 2020-06-20 ENCOUNTER — Ambulatory Visit (INDEPENDENT_AMBULATORY_CARE_PROVIDER_SITE_OTHER): Payer: Medicaid Other | Admitting: Internal Medicine

## 2020-06-20 DIAGNOSIS — R0602 Shortness of breath: Secondary | ICD-10-CM | POA: Diagnosis not present

## 2020-06-20 LAB — PULMONARY FUNCTION TEST
DL/VA % pred: 99 %
DL/VA: 4.35 ml/min/mmHg/L
DLCO cor % pred: 112 %
DLCO cor: 18.99 ml/min/mmHg
DLCO unc % pred: 112 %
DLCO unc: 18.99 ml/min/mmHg
FEF 25-75 Post: 2.85 L/sec
FEF 25-75 Pre: 2.6 L/sec
FEF2575-%Change-Post: 9 %
FEF2575-%Pred-Post: 137 %
FEF2575-%Pred-Pre: 125 %
FEV1-%Change-Post: 4 %
FEV1-%Pred-Post: 116 %
FEV1-%Pred-Pre: 111 %
FEV1-Post: 2.45 L
FEV1-Pre: 2.35 L
FEV1FVC-%Change-Post: 2 %
FEV1FVC-%Pred-Pre: 106 %
FEV6-%Change-Post: 2 %
FEV6-%Pred-Post: 110 %
FEV6-%Pred-Pre: 107 %
FEV6-Post: 2.88 L
FEV6-Pre: 2.81 L
FEV6FVC-%Pred-Post: 103 %
FEV6FVC-%Pred-Pre: 103 %
FVC-%Change-Post: 2 %
FVC-%Pred-Post: 106 %
FVC-%Pred-Pre: 103 %
FVC-Post: 2.88 L
FVC-Pre: 2.82 L
Post FEV1/FVC ratio: 85 %
Post FEV6/FVC ratio: 100 %
Pre FEV1/FVC ratio: 83 %
Pre FEV6/FVC Ratio: 100 %
RV % pred: 125 %
RV: 2.18 L
TLC % pred: 116 %
TLC: 5.01 L

## 2020-06-20 NOTE — Progress Notes (Signed)
Full PFT performed today. Referred by [facility].

## 2020-06-21 ENCOUNTER — Ambulatory Visit (INDEPENDENT_AMBULATORY_CARE_PROVIDER_SITE_OTHER): Payer: Medicaid Other | Admitting: Internal Medicine

## 2020-06-21 ENCOUNTER — Encounter: Payer: Self-pay | Admitting: Internal Medicine

## 2020-06-21 DIAGNOSIS — J449 Chronic obstructive pulmonary disease, unspecified: Secondary | ICD-10-CM | POA: Insufficient documentation

## 2020-06-21 DIAGNOSIS — J383 Other diseases of vocal cords: Secondary | ICD-10-CM

## 2020-06-21 DIAGNOSIS — F17210 Nicotine dependence, cigarettes, uncomplicated: Secondary | ICD-10-CM | POA: Diagnosis not present

## 2020-06-21 NOTE — Progress Notes (Signed)
Victoria Townsend, female    DOB: 10/10/1961, 59 y.o.   MRN: [id number]   Brief patient profile:  62 yowf active smoker with sob x 2011 with basically nl pfts while actively  sob 06/20/20 with saba > 4 h prior so referred to pulmonary clinic in [place]  06/21/2020 by Dr   Huel Cote p reported breathing  worse on symbicort   Dr Benjamine Mola prior eval included  neg CT neck 02/14/20     History of Present Illness  06/21/2020  Pulmonary/ 1st office eval/ Dorrine Montone / [place] Office  Chief Complaint  Patient presents with  . Consult    shortness of breath with exertion  Dyspnea:  Somewhat limited by L foot pain, several hundred yards, also ok on steps  At home/ somewhat limited by fatigue  Cough: none  Sleep: able to lie flat one pillow SABA use:  Up to 3 x daily / none at hs     No obvious day to day or daytime variability or assoc excess/ purulent sputum or mucus plugs or hemoptysis or cp or chest tightness, subjective wheeze or overt sinus or hb symptoms.   Sleeping  without nocturnal  or early am exacerbation  of respiratory  c/o's or need for noct saba. Also denies any obvious fluctuation of symptoms with weather or environmental changes or other aggravating or alleviating factors except as outlined above   No unusual exposure hx or h/o childhood pna/ asthma or knowledge of premature birth.  Current Allergies, Complete Past Medical History, Past Surgical History, Family History, and Social History were reviewed in [employer] record.  ROS  The following are not active complaints unless bolded Hoarseness, sore throat, dysphagia, dental problems, itching, sneezing,  nasal congestion or discharge of excess mucus or purulent secretions, ear ache,   fever, chills, sweats, unintended wt loss or wt gain, classically pleuritic or exertional cp,  orthopnea pnd or arm/hand swelling  or leg swelling, presyncope, palpitations, abdominal pain, anorexia, nausea, vomiting, diarrhea  or change  in bowel habits or change in bladder habits, change in stools or change in urine, dysuria, hematuria,  rash, arthralgias, visual complaints, headache, numbness, weakness or ataxia or problems with walking or coordination,  change in mood= anxious or  Memory.  L foot stinging            Past Medical History:  Diagnosis Date  . Agoraphobia   . Anxiety disorder   . Asthma   . Bladder polyps   . CAD (coronary artery disease)    Cardiac CTA June 2020  . Essential hypertension   . GERD (gastroesophageal reflux disease)   . Herpes   . Irregular heartbeat   . Nephrolithiasis   . Polysubstance abuse ([place])    History of cocaine, alcohol, marijuana in the past  . PTSD (post-traumatic stress disorder)   . Shoulder pain    Shoulder has been injected    Outpatient Medications Prior to Visit  Medication Sig Dispense Refill  . acetaminophen (TYLENOL) 500 MG tablet Take 1,000 mg by mouth every 8 (eight) hours as needed for moderate pain.     Marland Kitchen albuterol (VENTOLIN HFA) 108 (90 BASE) MCG/ACT inhaler Inhale 2 puffs into the lungs every 6 (six) hours as needed for shortness of breath.     . ALPRAZolam (XANAX) 1 MG tablet Take 1 mg by mouth 4 (four) times daily.     Marland Kitchen aspirin (ASPIR-LOW) 81 MG EC tablet Take 81 mg by mouth daily as  needed (for chest pain).     Marland Kitchen atorvastatin (LIPITOR) 10 MG tablet Take 1 tablet by mouth at bedtime.    . clobetasol (TEMOVATE) 0.05 % cream Apply 1 application topically daily as needed (for genital itching).     . gabapentin (NEURONTIN) 400 MG capsule Take 400 mg by mouth 3 (three) times daily.    . hydrocortisone 2.5 % ointment APPLY AS DIRECTED DAILY AS NEEDED FOR ITCHING. 28.35 g 2  . lidocaine (LIDODERM) 5 % Place 1 patch onto the skin every 12 (twelve) hours.    Marland Kitchen oxybutynin (DITROPAN) 5 MG tablet Take 2.5 mg by mouth as needed.   3  . pantoprazole (PROTONIX) 20 MG tablet Take 20 mg by mouth daily.    . RESTASIS 0.05 % ophthalmic emulsion Place 1 drop into both eyes  2 (two) times daily.    . valACYclovir (VALTREX) 1000 MG tablet Take 500 mg by mouth daily.     . verapamil (CALAN-SR) 240 MG CR tablet Take 240 mg by mouth daily.    . nitroGLYCERIN (NITROSTAT) 0.4 MG SL tablet Place 1 tablet (0.4 mg total) under the tongue every 5 (five) minutes as needed for chest pain. 25 tablet 3   No facility-administered medications prior to visit.     Objective:     BP (!) 144/80 (BP Location: Left Arm, Cuff Size: Normal)   Pulse 57   Temp 98.2 F (36.8 C) (Oral)   Ht 4\' 10"  (1.473 m)   Wt 142 lb 6.4 oz (64.6 kg)   SpO2 98%   BMI 29.76 kg/m   SpO2: 98 %   Amb wf who failed to answer a single question asked in a straightforward manner, tending to go off on tangents or answer questions with ambiguous medical terms or diagnoses     HEENT : pt wearing mask not removed for exam due to covid -19 concerns.    NECK :  without JVD/Nodes/TM/ nl carotid upstrokes bilaterally   LUNGS: no acc muscle use,  Nl contour chest which is clear to A and P bilaterally without cough on insp or exp maneuvers - transmitted pseudowheeze only    CV:  RRR  no s3 or murmur or increase in P2, and no edema   ABD:  soft and nontender with nl inspiratory excursion in the supine position. No bruits or organomegaly appreciated, bowel sounds nl  MS:  Nl gait/ ext warm without deformities, calf tenderness, cyanosis or clubbing No obvious joint restrictions   SKIN: warm and dry without lesions    NEURO:  alert, approp, nl sensorium with  no motor or cerebellar deficits apparent.   CXR PA and Lateral:   06/21/2020 :    I personally reviewed images and agree with radiology impression as follows:    did not go for cxr as rec      Assessment   COPD GOLD 0/ active smoker  Active smoker - 06/21/2020  After extensive coaching inhaler device,  effectiveness =   Near 0   Copd meds don't work because she doesn't have copd clinically despite CT evidence of emphysema (like worn valves  on a car exposed to dirty air but still nl compression/ performance etc).     I reviewed the Fletcher curve with the patient that basically indicates  if you quit smoking when your best day FEV1 is still well preserved (as is clearly  the case here)  it is highly unlikely you will progress to severe disease and  informed the patient there was  no medication on the market that has proven to alter the curve/ its downward trajectory  or the likelihood of progression of their disease(unlike other chronic medical conditions such as atheroclerosis where we do think we can change the natural hx with risk reducing meds)    Therefore stopping smoking and maintaining abstinence are  the most important aspects of care, not choice of inhalers or for that matter, doctors.   Treatment other than smoking cessation  is entirely directed by severity of symptoms and focused also on reducing exacerbations, not attempting to change the natural history of the disease.   Since no convincing symptoms nor exac hx no need for maint rx.  I spent extra time with pt today reviewing appropriate use of albuterol for prn use on exertion with the following points: 1) saba is for relief of sob that does not improve by walking a slower pace or resting but rather if the pt does not improve after trying this first. 2) If the pt is convinced, as many are, that saba helps recover from activity faster then it's easy to tell if this is the case by re-challenging : ie stop, take the inhaler, then p 5 minutes try the exact same activity (intensity of workload) that just caused the symptoms and see if they are substantially diminished or not after saba 3) if there is an activity that reproducibly causes the symptoms, try the saba 15 min before the activity on alternate days   If in fact the saba really does help, then fine to continue to use it prn but advised may need to look closer at the maintenance regimen being used to achieve better control  of airways disease with exertion.     Vocal cord dysfunction Dr Benjamine Mola prior eval included  neg CT neck 02/14/20   Nearly classic f/ v loop and exam strongly support this dx   Consider referral to [place] [facility] or   Garald Balding, [place]  [address]  [phone]    No pulmonary f/u needed      Cigarette smoker Counseled re importance of smoking cessation but did not meet time criteria for separate billing     Pt informed of the seriousness of COVID 19 infection as a direct risk to lung health  and safey and to close contacts and should continue to wear a facemask in public and minimize exposure to public locations but especially avoid any area or activity where non-close contacts are not observing distancing or wearing an appropriate face mask.  I strongly recommended she take either of the vaccines available through local drugstores based on updated information on millions of Americans treated with the [place] products  which have proven both safe and  effective even against the new delta variant.      Each maintenance medication was reviewed in detail including emphasizing most importantly the difference between maintenance and prns and under what circumstances the prns are to be triggered using an action plan format where appropriate.  Total time for H and P, chart review, counseling, teaching device and generating customized AVS unique to this office visit / charting = 45 min        Christinia Gully, MD 06/21/2020

## 2020-06-21 NOTE — Patient Instructions (Addendum)
Please remember to go to the  x-ray department  for your tests - we will call you with the results when they are available     You have what is called a GOLD 0  Copd - like having visible damage to valve and piston rings on a car from exposure to dirty air but still with normal compression and therefore normal pick up.  The key is to stop smoking completely before smoking completely stops you!  Only use your albuterol as a rescue medication to be used if you can't catch your breath by resting or doing a relaxed purse lip breathing pattern.  - The less you use it, the better it will work when you need it. - Ok to use up to 2 puffs  every 4 hours if you must but call for immediate appointment if use goes up over your usual need - Don't leave home without it !!  (think of it like the spare tire for your car)   Try albuterol 15 min before an activity that you know would make you short of breath and see if it makes any difference and if makes none then don't take it after activity unless you can't catch your breath.       I very strongly recommend you get the moderna or pfizer vaccine as soon as possible based on your risk of dying from the virus  and the proven safety and benefit of these vaccines against even the delta variant.  This can save your life as well as  those of your loved ones,  especially if they are also not vaccinated.      Regular pulmonary follow up is not needed

## 2020-06-22 ENCOUNTER — Encounter: Payer: Self-pay | Admitting: Internal Medicine

## 2020-06-22 DIAGNOSIS — J383 Other diseases of vocal cords: Secondary | ICD-10-CM | POA: Insufficient documentation

## 2020-06-22 NOTE — Assessment & Plan Note (Addendum)
Counseled re importance of smoking cessation but did not meet time criteria for separate billing     Pt informed of the seriousness of COVID 19 infection as a direct risk to lung health  and safey and to close contacts and should continue to wear a facemask in public and minimize exposure to public locations but especially avoid any area or activity where non-close contacts are not observing distancing or wearing an appropriate face mask.  I strongly recommended she take either of the vaccines available through local drugstores based on updated information on millions of Americans treated with the [place] products  which have proven both safe and  effective even against the new delta variant.

## 2020-06-22 NOTE — Assessment & Plan Note (Signed)
Active smoker - 06/21/2020  After extensive coaching inhaler device,  effectiveness =   Near 0   Copd meds don't work because she doesn't have copd clinically despite CT evidence of emphysema (like worn valves on a car exposed to dirty air but still nl compression/ performance etc).     I reviewed the Fletcher curve with the patient that basically indicates  if you quit smoking when your best day FEV1 is still well preserved (as is clearly  the case here)  it is highly unlikely you will progress to severe disease and informed the patient there was  no medication on the market that has proven to alter the curve/ its downward trajectory  or the likelihood of progression of their disease(unlike other chronic medical conditions such as atheroclerosis where we do think we can change the natural hx with risk reducing meds)    Therefore stopping smoking and maintaining abstinence are  the most important aspects of care, not choice of inhalers or for that matter, doctors.   Treatment other than smoking cessation  is entirely directed by severity of symptoms and focused also on reducing exacerbations, not attempting to change the natural history of the disease.   Since no convincing symptoms nor exac hx no need for maint rx.  I spent extra time with pt today reviewing appropriate use of albuterol for prn use on exertion with the following points: 1) saba is for relief of sob that does not improve by walking a slower pace or resting but rather if the pt does not improve after trying this first. 2) If the pt is convinced, as many are, that saba helps recover from activity faster then it's easy to tell if this is the case by re-challenging : ie stop, take the inhaler, then p 5 minutes try the exact same activity (intensity of workload) that just caused the symptoms and see if they are substantially diminished or not after saba 3) if there is an activity that reproducibly causes the symptoms, try the saba 15 min  before the activity on alternate days   If in fact the saba really does help, then fine to continue to use it prn but advised may need to look closer at the maintenance regimen being used to achieve better control of airways disease with exertion.

## 2020-06-22 NOTE — Assessment & Plan Note (Signed)
Dr Benjamine Mola prior eval included  neg CT neck 02/14/20   Nearly classic f/ v loop and exam strongly support this dx   Consider referral to [place] [facility] or   Victoria Townsend, Victoria Townsend  [address]  [phone]    No pulmonary f/u needed          Each maintenance medication was reviewed in detail including emphasizing most importantly the difference between maintenance and prns and under what circumstances the prns are to be triggered using an action plan format where appropriate.  Total time for H and P, chart review, counseling, teaching device and generating customized AVS unique to this office visit / charting = 45 min

## 2020-07-18 ENCOUNTER — Other Ambulatory Visit: Payer: Self-pay

## 2020-07-18 ENCOUNTER — Ambulatory Visit (HOSPITAL_COMMUNITY)
Admission: RE | Admit: 2020-07-18 | Discharge: 2020-07-18 | Disposition: A | Discharge status: Home / Self Care | Payer: Medicaid Other | Source: Ambulatory Visit | Attending: Internal Medicine | Admitting: Internal Medicine

## 2020-07-18 DIAGNOSIS — J449 Chronic obstructive pulmonary disease, unspecified: Secondary | ICD-10-CM | POA: Insufficient documentation

## 2020-07-20 NOTE — Progress Notes (Signed)
Spoke with pt and notified of results per Dr. Wert. Pt verbalized understanding and denied any questions. 

## 2020-09-22 ENCOUNTER — Other Ambulatory Visit (HOSPITAL_COMMUNITY): Payer: Self-pay

## 2020-09-22 DIAGNOSIS — Z122 Encounter for screening for malignant neoplasm of respiratory organs: Secondary | ICD-10-CM

## 2020-09-22 DIAGNOSIS — Z87891 Personal history of nicotine dependence: Secondary | ICD-10-CM

## 2020-09-22 NOTE — Progress Notes (Signed)
Patient scheduled for follow-up LDCT on 10/18/2020 at 11am.

## 2020-10-18 ENCOUNTER — Ambulatory Visit (HOSPITAL_COMMUNITY): Payer: Medicaid Other

## 2020-10-27 ENCOUNTER — Ambulatory Visit (HOSPITAL_COMMUNITY)
Admission: RE | Admit: 2020-10-27 | Discharge: 2020-10-27 | Disposition: A | Discharge status: Home / Self Care | Payer: Medicaid Other | Source: Ambulatory Visit | Attending: Oncology | Admitting: Oncology

## 2020-10-27 ENCOUNTER — Other Ambulatory Visit: Payer: Self-pay

## 2020-10-27 DIAGNOSIS — Z87891 Personal history of nicotine dependence: Secondary | ICD-10-CM | POA: Diagnosis present

## 2020-10-27 DIAGNOSIS — Z122 Encounter for screening for malignant neoplasm of respiratory organs: Secondary | ICD-10-CM | POA: Diagnosis present

## 2020-11-02 ENCOUNTER — Encounter (HOSPITAL_COMMUNITY): Payer: Self-pay

## 2020-11-02 NOTE — Progress Notes (Signed)
Patient notified of LDCT Lung Cancer Screening Results via mail with the recommendation to follow-up in 12 months. Patient's referring provider has been sent a copy of results. Results are as follows:  IMPRESSION: 1. Lung-RADS 2, benign appearance or behavior. Continue annual screening with low-dose chest CT without contrast in 12 months. 2. Aortic atherosclerosis (ICD10-I70.0). Coronary artery calcification. 3.  Emphysema (ICD10-J43.9).

## 2020-12-06 ENCOUNTER — Other Ambulatory Visit: Payer: Self-pay

## 2020-12-06 ENCOUNTER — Ambulatory Visit (INDEPENDENT_AMBULATORY_CARE_PROVIDER_SITE_OTHER): Payer: Medicaid Other | Admitting: Internal Medicine

## 2020-12-06 ENCOUNTER — Encounter: Payer: Self-pay | Admitting: Internal Medicine

## 2020-12-06 DIAGNOSIS — F17210 Nicotine dependence, cigarettes, uncomplicated: Secondary | ICD-10-CM

## 2020-12-06 DIAGNOSIS — J449 Chronic obstructive pulmonary disease, unspecified: Secondary | ICD-10-CM

## 2020-12-06 NOTE — Patient Instructions (Addendum)
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  The key is to stop smoking completely before smoking completely stops you!  The CT scan that you had shows the scarring of your lungs as mentioned above which does cause lung pain.   Only use your albuterol as a rescue medication to be used if you can't catch your breath by resting or doing a relaxed purse lip breathing pattern.  - The less you use it, the better it will work when you need it. - Ok to use up to 2 puffs  every 4 hours if you must but call for immediate appointment if use goes up over your usual need - Don't leave home without it !!  (think of it like the spare tire for your car)     Pulmonary follow up is not needed

## 2020-12-06 NOTE — Assessment & Plan Note (Signed)
Active smoker - PFTs  06/20/20  Nl except for subtle truncation  of insp > exp f/v loop and low erv  - 06/21/2020  After extensive coaching inhaler device,  effectiveness =   Near 0  - LDCT Chest 10/31/20  C/w emphysema - 12/06/2020 refused walking sats   > 3 min discussion I reviewed the Fletcher curve with the patient that basically indicates  if you quit smoking when your best day FEV1 is still well preserved (as is clearly  the case here)  it is highly unlikely you will progress to severe disease and informed the patient there was  no medication on the market that has proven to alter the curve/ its downward trajectory  or the likelihood of progression of their disease(unlike other chronic medical conditions such as atheroclerosis where we do think we can change the natural hx with risk reducing meds)    Therefore stopping smoking and maintaining abstinence are  the most important aspects of care, not choice of inhalers or for that matter, doctors.   Treatment other than smoking cessation  is entirely directed by severity of symptoms and focused also on reducing exacerbations, not attempting to change the natural history of the disease.   Since neither applies here, pulmonary f/u is prn

## 2020-12-06 NOTE — Assessment & Plan Note (Signed)
Counseled re importance of smoking cessation but did not meet time criteria for separate billing    Advised: Patient is unvaccinated and was informed of the seriousness of COVID 19 infection as a direct risk to lung health as well as safety to close contacts and should continue to wear a facemask in public and minimize exposure to public locations but especially avoid any area or activity where non-close contacts are not observing distancing or wearing an appropriate face mask.  I strongly recommended pt take either of the vaccines available through local drugstores based on updated information on millions of Americans treated with the [place] products  which have proven both safe and  effective even against the  delta and new omicron variant to prevent hospitalization and death.          Each maintenance medication was reviewed in detail including emphasizing most importantly the difference between maintenance and prns and under what circumstances the prns are to be triggered using an action plan format where appropriate.  Total time for H and P, chart review, counseling,  and generating customized AVS unique to this final summary  office visit / same day charting  > 30 min

## 2020-12-06 NOTE — Progress Notes (Signed)
Victoria Townsend, female    DOB: 10-18-1961, 60 y.o.   MRN: [id number]   Brief patient profile:  67 yowf active smoker with sob x 2011 with basically nl pfts while actively  sob 06/20/20 with saba > 4 h prior so referred to pulmonary clinic in [place]  06/21/2020 by Dr   Huel Cote p reported breathing  worse on symbicort   Dr Benjamine Mola prior eval included  neg CT neck 02/14/20     History of Present Illness  06/21/2020  Pulmonary/ 1st office eval/ Victoria Townsend / [place] [facility]  Chief Complaint  Patient presents with  . 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 The key is to stop smoking completely before smoking completely stops you! Only use your albuterol as a rescue medication   Try albuterol 15 min before an activity that you know would make you short of breath and see if it makes any difference and if makes none then don't take it after activity unless you can't catch your breath.  I very strongly recommend you get the moderna or pfizer vaccine    Regular pulmonary follow up is not needed    12/06/2020  f/u ov/[place] office/Victoria Townsend re: GOLD 0/ still smoking  Chief Complaint  Patient presents with  . Follow-up    Patient states that her breathing is the same since last visit. Has some shortness of breath with exertion. Sometimes has a cough  Dyspnea:  Limited by pain / not breathing but by hurting all over  Cough: none now,  "Lungs sting, they have spasm and toothaches"  - describes positional cp/ shoulder and back pain, latter better if rubs back against a bookcase  Sleeping: pain  interupts but not resp symptoms SABA use: 6 am  8 h prior to OV   02: none    No obvious day to day or daytime variability or assoc excess/ purulent sputum or mucus plugs or hemoptysis or   chest tightness, subjective wheeze or overt sinus or hb symptoms.   Sleeping as above  without nocturnal  or early am exacerbation  of respiratory  c/o's or need for noct saba. Also denies any obvious fluctuation of symptoms with weather or environmental changes or other aggravating or alleviating factors except as outlined above   No unusual exposure hx or h/o childhood pna/ asthma or knowledge of premature birth.  Current Allergies, Complete Past Medical History, Past Surgical History, Family History, and Social History were reviewed in [employer] record.  ROS  The following are not active complaints unless bolded Hoarseness, sore throat, dysphagia, dental problems, itching, sneezing,  nasal congestion or discharge of excess mucus or purulent secretions, ear ache,   fever, chills, sweats, unintended wt loss or wt gain, classically pleuritic or exertional cp,  orthopnea pnd or arm/hand swelling  or leg swelling, presyncope, palpitations, abdominal pain, anorexia, nausea, vomiting, diarrhea  or change in bowel habits or change in bladder habits, change in stools or change in urine,  dysuria, hematuria,  rash, arthralgias, visual complaints, headache, numbness, weakness or ataxia or problems with walking or coordination,  change in mood or  memory.        Current Meds  Medication Sig  . acetaminophen (TYLENOL) 500 MG tablet Take 1,000 mg by mouth every 8 (eight) hours as needed for moderate pain.  Marland Kitchen albuterol (VENTOLIN HFA) 108 (90 Base) MCG/ACT inhaler Inhale 2 puffs into the lungs every 6 (six) hours as needed for shortness of breath.  . ALPRAZolam (XANAX) 1 MG tablet Take 1 mg by mouth 4 (four) times daily.   Marland Kitchen aspirin 81 MG EC tablet Take 81 mg by mouth daily as needed (for chest  pain).  Marland Kitchen atorvastatin (LIPITOR) 10 MG tablet Take 1 tablet by mouth at bedtime.  . gabapentin (NEURONTIN) 400 MG capsule Take 400 mg by mouth 3 (three) times daily.  . hydrocortisone 2.5 % ointment APPLY AS DIRECTED DAILY AS NEEDED FOR ITCHING.  . lidocaine (LIDODERM) 5 % Place 1 patch onto the skin every 12 (twelve) hours.  Marland Kitchen oxybutynin (DITROPAN) 5 MG tablet Take 2.5 mg by mouth as needed.   . pantoprazole (PROTONIX) 20 MG tablet Take 20 mg by mouth daily.  . RESTASIS 0.05 % ophthalmic emulsion Place 1 drop into both eyes 2 (two) times daily.  . valACYclovir (VALTREX) 1000 MG tablet Take 500 mg by mouth daily.  . verapamil (CALAN-SR) 240 MG CR tablet Take 240 mg by mouth daily.                          Past Medical History:  Diagnosis Date  . Agoraphobia   . Anxiety disorder   . Asthma   . Bladder polyps   . CAD (coronary artery disease)    Cardiac CTA June 2020  . Essential hypertension   . GERD (gastroesophageal reflux disease)   . Herpes   . Irregular heartbeat   . Nephrolithiasis   . Polysubstance abuse ([place])    History of cocaine, alcohol, marijuana in the past  . PTSD (post-traumatic stress disorder)   . Shoulder pain    Shoulder has been injected      Objective:     Wt Readings from Last 3 Encounters:  12/06/20 137 lb 6.4 oz (62.3 kg)  06/21/20 142 lb 6.4 oz (64.6 kg)  06/13/20 145 lb 12.8 oz (66.1 kg)      Vital signs reviewed  12/06/2020  - Note at rest 02 sats  98% on RA  General appearance:   amb anxious wf nad    HEENT : pt wearing mask not removed for exam due to covid -19 concerns.    NECK :  without JVD/Nodes/TM/ nl carotid upstrokes bilaterally   LUNGS: no acc muscle use,  Nl contour chest which is clear to A and P bilaterally without cough on insp or exp maneuvers   CV:  RRR  no s3 or murmur or increase in P2, and no edema   ABD:  soft and nontender with nl inspiratory excursion in the supine position. No bruits or organomegaly  appreciated, bowel sounds nl  MS:  Nl gait/ ext warm without deformities, calf tenderness, cyanosis or clubbing No obvious joint restrictions   SKIN: warm and dry without lesions    NEURO:  alert, approp, nl sensorium with  no motor or cerebellar deficits apparent.        I personally reviewed images and agree with radiology impression as follows:  Chest CT low dose screening 10/27/20 1. Lung-RADS 2, benign appearance or behavior. Continue annual screening with low-dose chest CT without contrast in 12 months. 2. Aortic atherosclerosis (ICD10-I70.0). Coronary artery calcification. 3.  Emphysema (ICD10-J43.9).    Assessment

## 2020-12-19 ENCOUNTER — Encounter: Payer: Self-pay | Admitting: Cardiology

## 2020-12-19 ENCOUNTER — Other Ambulatory Visit: Payer: Self-pay

## 2020-12-19 ENCOUNTER — Ambulatory Visit (INDEPENDENT_AMBULATORY_CARE_PROVIDER_SITE_OTHER): Payer: Medicaid Other | Admitting: Cardiology

## 2020-12-19 VITALS — BP 132/68 | HR 57 | Ht <= 58 in | Wt 136.0 lb

## 2020-12-19 DIAGNOSIS — I25119 Atherosclerotic heart disease of native coronary artery with unspecified angina pectoris: Secondary | ICD-10-CM | POA: Diagnosis not present

## 2020-12-19 DIAGNOSIS — E782 Mixed hyperlipidemia: Secondary | ICD-10-CM

## 2020-12-19 NOTE — Progress Notes (Signed)
Cardiology Office Note  Date: 12/19/2020   ID: Victoria Townsend, Victoria Townsend 1961/02/17, MRN [id number]  PCP:  Leeanne Rio, MD  Cardiologist:  Rozann Lesches, MD Electrophysiologist:  None   Chief Complaint  Patient presents with  . Cardiac follow-up    History of Present Illness: Victoria Townsend is a 60 y.o. female last seen in July 2021.  She presents for a routine visit.  Main complaint is of pain related to her left rotator cuff, states that she had an injury and is pending evaluation with MRI, also follow-up in the pain clinic.  She is not enthusiastic about having any surgery.  From a cardiac perspective she does report intermittent chest pain, typical and atypical.  She uses sublingual nitroglycerin as we have discussed.  I reviewed the remainder of her medications which are outlined below, she states that she has been compliant with therapy including aspirin, Lipitor, and [name].  I personally reviewed her ECG today which shows normal sinus rhythm.  Past Medical History:  Diagnosis Date  . Agoraphobia   . Anxiety disorder   . Asthma   . Bladder polyps   . CAD (coronary artery disease)    Cardiac CTA June 2020  . Essential hypertension   . GERD (gastroesophageal reflux disease)   . Herpes   . Irregular heartbeat   . Nephrolithiasis   . Polysubstance abuse ([place])    History of cocaine, alcohol, marijuana in the past  . PTSD (post-traumatic stress disorder)   . Shoulder pain    Shoulder has been injected    Past Surgical History:  Procedure Laterality Date  . APPENDECTOMY    . Bladder polyps    . CESAREAN SECTION WITH BILATERAL TUBAL LIGATION    . CHOLECYSTECTOMY    . COLONOSCOPY  May 2012   Dr. Posey Pronto: scattered diverticula pancolonic  . COLONOSCOPY WITH PROPOFOL N/A 01/29/2018   Procedure: COLONOSCOPY WITH PROPOFOL;  Surgeon: Daneil Dolin, MD;  [facility]: [facility];  Service: Endoscopy;  Laterality: N/A;  . ESOPHAGOGASTRODUODENOSCOPY  April 2012   Dr.  Posey Pronto: gastritis  . ESOPHAGOGASTRODUODENOSCOPY N/A 08/09/2013   JAS:[id number] polyps/abnormal gastric because of uncertain significance-status post biopsy. Small hiatal PrismBlog.es chronic inflammation on bx. hyperplastic polyp  . ESOPHAGOGASTRODUODENOSCOPY (EGD) WITH PROPOFOL N/A 01/29/2018   Procedure: ESOPHAGOGASTRODUODENOSCOPY (EGD) WITH PROPOFOL;  Surgeon: Daneil Dolin, MD;  Location: [facility];  Service: Endoscopy;  Laterality: N/A;  1:30  . MALONEY DILATION N/A 01/29/2018   Procedure: Venia Minks DILATION;  Surgeon: Daneil Dolin, MD;  Location: [facility];  Service: Endoscopy;  Laterality: N/A;  . URETHRAL STRICTURE DILATATION    . WRIST FRACTURE SURGERY Right     Current Outpatient Medications  Medication Sig Dispense Refill  . acetaminophen (TYLENOL) 500 MG tablet Take 1,000 mg by mouth every 8 (eight) hours as needed for moderate pain.    Marland Kitchen albuterol (VENTOLIN HFA) 108 (90 Base) MCG/ACT inhaler Inhale 2 puffs into the lungs every 6 (six) hours as needed for shortness of breath.    . ALPRAZolam (XANAX) 1 MG tablet Take 1 mg by mouth 4 (four) times daily.     Marland Kitchen aspirin 81 MG EC tablet Take 81 mg by mouth daily as needed (for chest pain).    Marland Kitchen atorvastatin (LIPITOR) 10 MG tablet Take 1 tablet by mouth at bedtime.    . cholecalciferol (VITAMIN D3) 25 MCG (1000 UNIT) tablet Take 1,000 Units by mouth daily.    Marland Kitchen  gabapentin (NEURONTIN) 400 MG capsule Take 400 mg by mouth 3 (three) times daily.    . hydrocortisone 2.5 % ointment APPLY AS DIRECTED DAILY AS NEEDED FOR ITCHING. 28.35 g 2  . lidocaine (LIDODERM) 5 % Place 1 patch onto the skin every 12 (twelve) hours.    . Multiple Vitamin (MULTI-DAY PO) Take by mouth.    . nitroGLYCERIN (NITROSTAT) 0.4 MG SL tablet Place 1 tablet (0.4 mg total) under the tongue every 5 (five) minutes as needed for chest pain. 25 tablet 3  . oxybutynin (DITROPAN) 5 MG tablet Take 2.5 mg by mouth as needed.   3  . pantoprazole (PROTONIX) 20 MG tablet Take 20  mg by mouth daily.    . RESTASIS 0.05 % ophthalmic emulsion Place 1 drop into both eyes 2 (two) times daily.    . valACYclovir (VALTREX) 1000 MG tablet Take 500 mg by mouth daily.    . verapamil (CALAN-SR) 240 MG CR tablet Take 240 mg by mouth daily.    . vitamin C (ASCORBIC ACID) 500 MG tablet Take 500 mg by mouth daily.     No current facility-administered medications for this visit.   Allergies:  Ciprofloxacin, Codeine, Ibuprofen, Imdur [isosorbide nitrate], Mucinex [guaifenesin er], Roxicodone [oxycodone], Symbicort [budesonide-formoterol fumarate], Erythromycin, and Shellfish allergy   ROS: No palpitations or syncope.  Physical Exam: VS:  BP 132/68   Pulse (!) 57   Ht 4\' 10"  (1.473 m)   Wt 136 lb (61.7 kg)   SpO2 97%   BMI 28.42 kg/m , BMI Body mass index is 28.42 kg/m.  Wt Readings from Last 3 Encounters:  12/19/20 136 lb (61.7 kg)  12/06/20 137 lb 6.4 oz (62.3 kg)  06/21/20 142 lb 6.4 oz (64.6 kg)    General: Patient appears comfortable at rest. HEENT: Conjunctiva and lids normal, wearing a mask. Neck: Supple, no elevated JVP or carotid bruits, no thyromegaly. Lungs: Clear to auscultation, nonlabored breathing at rest. Cardiac: Regular rate and rhythm, no S3 or significant systolic murmur. Extremities: No pitting edema.  ECG:  An ECG dated 11/05/2019 was personally reviewed today and demonstrated:  Sinus rhythm with R' in lead V1.  Recent Labwork: 02/14/2020: Creatinine, Ser 0.80  December 2020: AST 19, ALT 9, cholesterol 178, HDL 71, LDL 76, triglycerides 159  Other Studies Reviewed Today:  Cardiac CTA 05/20/2019: FINDINGS: Coronary calcium score: The patient's coronary artery calcium score is 443, which places the patient in the 99 percentile.  Coronary arteries: Normal coronary origins. Right dominance.  Right Coronary Artery: Mild mixed atherosclerotic plaque in the proximal RCA, 25-49% stenosis. Moderate mixed atherosclerotic plaque in the mid RCA,  50-69%. Severe mixed atherosclerotic plaque in the distal RCA, 70-99% stenosis. Patent PDA and PL with diffuse minimal plaque.  Left Main Coronary Artery: No detectable plaque or stenosis.  Left Anterior Descending Coronary Artery: Mild mixed atherosclerotic plaque in the proximal LAD, 25-49% stenosis. Moderate mixed atherosclerotic plaque in the mid LAD, 50-69%. Moderate mixed atherosclerotic plaque in the distal LAD 50-69%. Patent diagonal branches, mild mixed atherosclerotic plaque in the mid LAD at the takeoff of the first diagonal branch.  Left Circumflex Artery: Moderate mixed atherosclerotic plaque in the proximal L circumflex 50-69%. Possible severe atherosclerotic plaque in the proximal-distal L circumflex, 70-99%, at the bifurcation of medium caliber OM2. OM1 is small caliber but patent.  Aorta: Normal size, 26 mm at the mid ascending aorta (level of the PA bifurcation) measured double oblique. No calcifications. No dissection.  Aortic Valve: No  calcifications.  Other findings:  Normal pulmonary vein drainage into the left atrium.  Normal left atrial appendage without a thrombus.  Normal size of the pulmonary artery.  IMPRESSION: 1. Severe CAD in the distal RCA and proximal-distal circumflex artery, CADRADS = 4. CT FFR analysis will be performed and reported separately.  2. The patient's coronary artery calcium score is 443, which places the patient in the 37 percentile for age and sex matched control.  3. Normal coronary origin with right dominance.  Cardiac CT FFR 05/20/2019: 1. Left Main: No significant stenosis. FFR = 0.97  2. LAD: Proximal FFR = 0.95, Mid FFR = 0.81, Distal FFR = 0.73 3. LCX: Proximal FFR = 0.94, Distal FFR = 0.85, OM 2 = 0.84 4. RCA: Proximal FFR = 0.96, Mid FFR =0.87, Distal FFR = 0.84, PDA not mapped  IMPRESSION: 1. CT FFR analysis demonstrates possible hemodynamically significant lesion in the distal LAD. Vessel  diameter just proximal to this lesion is 2.65 mm. Consider aggressive medical management for distal CAD.  Assessment and Plan:  1.  Multivessel coronary artery disease by cardiac CTA, most significant disease involving the distal LAD in small caliber vessel that is best managed medically.  Plan is to continue observation, ECG reviewed and normal today.  Continue aspirin, Lipitor, and [name].  She has as needed nitroglycerin available.  2.  Mixed hyperlipidemia, reports compliance with Lipitor.  Last LDL was 76.  Keep follow-up with PCP.  Medication Adjustments/Labs and Tests Ordered: Current medicines are reviewed at length with the patient today.  Concerns regarding medicines are outlined above.   Tests Ordered: Orders Placed This Encounter  Procedures  . EKG 12-Lead    Medication Changes: No orders of the defined types were placed in this encounter.   Disposition:  Follow up 6 months in the [place] office.  Signed, Satira Sark, MD, [facility] 12/19/2020 11:46 AM    [facility] [place], [place], [place] [address] Phone: [phone]; Fax: [phone]

## 2020-12-19 NOTE — Patient Instructions (Addendum)

## 2021-03-13 ENCOUNTER — Encounter: Payer: Self-pay | Admitting: Neurology

## 2021-04-25 ENCOUNTER — Other Ambulatory Visit: Payer: Self-pay | Admitting: Cardiology

## 2021-06-04 ENCOUNTER — Ambulatory Visit: Payer: Medicaid Other | Admitting: Neurology

## 2021-06-07 ENCOUNTER — Encounter: Payer: Self-pay | Admitting: Neurology

## 2021-06-19 ENCOUNTER — Ambulatory Visit: Payer: Medicaid Other | Admitting: Cardiology

## 2021-06-19 NOTE — Progress Notes (Deleted)
Cardiology Office Note  Date: 06/19/2021   ID: Victoria, Townsend December 04, 1960, MRN [id number]  PCP:  Leeanne Rio, MD  Cardiologist:  Rozann Lesches, MD Electrophysiologist:  None   No chief complaint on file.   History of Present Illness: Victoria Townsend is a 60 y.o. female last seen in January.  Past Medical History:  Diagnosis Date   Agoraphobia    Anxiety disorder    Asthma    Bladder polyps    CAD (coronary artery disease)    Cardiac CTA June 2020   Essential hypertension    GERD (gastroesophageal reflux disease)    Herpes    Irregular heartbeat    Nephrolithiasis    Polysubstance abuse (HCC)    History of cocaine, alcohol, marijuana in the past   PTSD (post-traumatic stress disorder)    Shoulder pain    Shoulder has been injected    Past Surgical History:  Procedure Laterality Date   APPENDECTOMY     Bladder polyps     CESAREAN SECTION WITH BILATERAL TUBAL LIGATION     CHOLECYSTECTOMY     COLONOSCOPY  May 2012   Dr. Posey Pronto: scattered diverticula pancolonic   COLONOSCOPY WITH PROPOFOL N/A 01/29/2018   Procedure: COLONOSCOPY WITH PROPOFOL;  Surgeon: Daneil Dolin, MD;  [facility];  Service: Endoscopy;  Laterality: N/A;   ESOPHAGOGASTRODUODENOSCOPY  April 2012   Dr. Posey Pronto: gastritis   ESOPHAGOGASTRODUODENOSCOPY N/A 08/09/2013   [id number] polyps/abnormal gastric because of uncertain significance-status post biopsy. Small hiatal [email or web address] chronic inflammation on bx. hyperplastic polyp   ESOPHAGOGASTRODUODENOSCOPY (EGD) WITH PROPOFOL N/A 01/29/2018   Procedure: ESOPHAGOGASTRODUODENOSCOPY (EGD) WITH PROPOFOL;  Surgeon: Daneil Dolin, MD;  [facility];  Service: Endoscopy;  Laterality: N/A;  1:30   MALONEY DILATION N/A 01/29/2018   Procedure: Venia Minks DILATION;  Surgeon: Daneil Dolin, MD;  [facility];  Service: Endoscopy;  Laterality: N/A;   URETHRAL STRICTURE DILATATION     WRIST FRACTURE SURGERY Right     Current  Outpatient Medications  Medication Sig Dispense Refill   acetaminophen (TYLENOL) 500 MG tablet Take 1,000 mg by mouth every 8 (eight) hours as needed for moderate pain.     albuterol (VENTOLIN HFA) 108 (90 Base) MCG/ACT inhaler Inhale 2 puffs into the lungs every 6 (six) hours as needed for shortness of breath.     ALPRAZolam (XANAX) 1 MG tablet Take 1 mg by mouth 4 (four) times daily.      aspirin 81 MG EC tablet Take 81 mg by mouth daily as needed (for chest pain).     atorvastatin (LIPITOR) 10 MG tablet Take 1 tablet by mouth at bedtime.     cholecalciferol (VITAMIN D3) 25 MCG (1000 UNIT) tablet Take 1,000 Units by mouth daily.     gabapentin (NEURONTIN) 400 MG capsule Take 400 mg by mouth 3 (three) times daily.     hydrocortisone 2.5 % ointment APPLY AS DIRECTED DAILY AS NEEDED FOR ITCHING. 28.35 g 2   lidocaine (LIDODERM) 5 % Place 1 patch onto the skin every 12 (twelve) hours.     Multiple Vitamin (MULTI-DAY PO) Take by mouth.     nitroGLYCERIN (NITROSTAT) 0.4 MG SL tablet DISSOLVE 1 TABLET UNDER THE TONGUE EVERY 5 MINUTES AS NEEDED FOR CHEST PAIN. DO NOT EXCEED A TOTAL OF 3 DOSES IN 15 MINUTES. 25 tablet 1   oxybutynin (DITROPAN) 5 MG tablet Take 2.5 mg by mouth as needed.   3  pantoprazole (PROTONIX) 20 MG tablet Take 20 mg by mouth daily.     RESTASIS 0.05 % ophthalmic emulsion Place 1 drop into both eyes 2 (two) times daily.     valACYclovir (VALTREX) 1000 MG tablet Take 500 mg by mouth daily.     verapamil (CALAN-SR) 240 MG CR tablet Take 240 mg by mouth daily.     vitamin C (ASCORBIC ACID) 500 MG tablet Take 500 mg by mouth daily.     No current facility-administered medications for this visit.   Allergies:  Ciprofloxacin, Codeine, Ibuprofen, Imdur [isosorbide nitrate], Mucinex [guaifenesin er], Roxicodone [oxycodone], Symbicort [budesonide-formoterol fumarate], Erythromycin, and Shellfish allergy   Social History: The patient  reports that she has been smoking cigarettes. She  started smoking about 47 years ago. She has a 22.50 pack-year smoking history. She has never used smokeless tobacco. She reports previous drug use. Drugs: Marijuana and Cocaine. She reports that she does not drink alcohol.   Family History: The patient's family history includes Breast cancer in her paternal aunt; Colon cancer (age of onset: 70) in her paternal uncle; Coronary artery disease in her mother; Liver cancer in her sister; Lung cancer in her sister; Ovarian cancer in her maternal aunt; Stomach cancer (age of onset: 11) in her maternal aunt; Stomach cancer (age of onset: 27) in her paternal grandmother.   ROS:  Please see the history of present illness. Otherwise, complete review of systems is positive for {NONE DEFAULTED:18576}.  All other systems are reviewed and negative.   Physical Exam: VS:  There were no vitals taken for this visit., BMI There is no height or weight on file to calculate BMI.  Wt Readings from Last 3 Encounters:  12/19/20 136 lb (61.7 kg)  12/06/20 137 lb 6.4 oz (62.3 kg)  06/21/20 142 lb 6.4 oz (64.6 kg)    General: Patient appears comfortable at rest. HEENT: Conjunctiva and lids normal, oropharynx clear with moist mucosa. Neck: Supple, no elevated JVP or carotid bruits, no thyromegaly. Lungs: Clear to auscultation, nonlabored breathing at rest. Cardiac: Regular rate and rhythm, no S3 or significant systolic murmur, no pericardial rub. Abdomen: Soft, nontender, no hepatomegaly, bowel sounds present, no guarding or rebound. Extremities: No pitting edema, distal pulses 2+. Skin: Warm and dry. Musculoskeletal: No kyphosis. Neuropsychiatric: Alert and oriented x3, affect grossly appropriate.  ECG:  An ECG dated 12/19/2020 was personally reviewed today and demonstrated:  Sinus rhythm.  Recent Labwork:  April 2022: Hemoglobin 13.7, platelets 257, potassium 4.2, BUN 10, creatinine 0.74, AST 18, ALT 16, high-sensitivity troponin I levels negative June 2022: BUN  13, creatinine 0.73  Other Studies Reviewed Today:  Cardiac CTA 05/20/2019: FINDINGS: Coronary calcium score: The patient's coronary artery calcium score is 443, which places the patient in the 99 percentile.   Coronary arteries: Normal coronary origins.  Right dominance.   Right Coronary Artery: Mild mixed atherosclerotic plaque in the proximal RCA, 25-49% stenosis. Moderate mixed atherosclerotic plaque in the mid RCA, 50-69%. Severe mixed atherosclerotic plaque in the distal RCA, 70-99% stenosis. Patent PDA and PL with diffuse minimal plaque.   Left Main Coronary Artery: No detectable plaque or stenosis.   Left Anterior Descending Coronary Artery: Mild mixed atherosclerotic plaque in the proximal LAD, 25-49% stenosis. Moderate mixed atherosclerotic plaque in the mid LAD, 50-69%. Moderate mixed atherosclerotic plaque in the distal LAD 50-69%. Patent diagonal branches, mild mixed atherosclerotic plaque in the mid LAD at the takeoff of the first diagonal branch.   Left Circumflex Artery: Moderate mixed  atherosclerotic plaque in the proximal L circumflex 50-69%. Possible severe atherosclerotic plaque in the proximal-distal L circumflex, 70-99%, at the bifurcation of medium caliber OM2. OM1 is small caliber but patent.   Aorta: Normal size, 26 mm at the mid ascending aorta (level of the PA bifurcation) measured double oblique. No calcifications. No dissection.   Aortic Valve: No calcifications.   Other findings:   Normal pulmonary vein drainage into the left atrium.   Normal left atrial appendage without a thrombus.   Normal size of the pulmonary artery.   IMPRESSION: 1. Severe CAD in the distal RCA and proximal-distal circumflex artery, CADRADS = 4. CT FFR analysis will be performed and reported separately.   2. The patient's coronary artery calcium score is 443, which places the patient in the 30 percentile for age and sex matched control.   3. Normal coronary  origin with right dominance.   Cardiac CT FFR 05/20/2019: 1. Left Main:  No significant stenosis. FFR = 0.97   2. LAD: Proximal FFR = 0.95, Mid FFR = 0.81, Distal FFR = 0.73 3. LCX: Proximal FFR = 0.94, Distal FFR = 0.85, OM 2 = 0.84 4. RCA: Proximal FFR = 0.96, Mid FFR =0.87, Distal FFR = 0.84, PDA not mapped   IMPRESSION: 1. CT FFR analysis demonstrates possible hemodynamically significant lesion in the distal LAD. Vessel diameter just proximal to this lesion is 2.65 mm. Consider aggressive medical management for distal CAD.  Assessment and Plan:    Medication Adjustments/Labs and Tests Ordered: Current medicines are reviewed at length with the patient today.  Concerns regarding medicines are outlined above.   Tests Ordered: No orders of the defined types were placed in this encounter.   Medication Changes: No orders of the defined types were placed in this encounter.   Disposition:  Follow up {follow up:15908}  Signed, Satira Sark, MD, [facility] 06/19/2021 8:07 AM    [place] at [place], [place], [place] [address] Phone: [phone]; Fax: [phone]

## 2021-07-11 ENCOUNTER — Other Ambulatory Visit: Payer: Self-pay | Admitting: Cardiology

## 2021-07-23 ENCOUNTER — Encounter: Payer: Self-pay | Admitting: Nurse Practitioner

## 2021-08-03 ENCOUNTER — Other Ambulatory Visit: Payer: Self-pay

## 2021-08-03 ENCOUNTER — Ambulatory Visit: Payer: Medicaid Other | Admitting: Cardiology

## 2021-08-03 ENCOUNTER — Encounter: Payer: Self-pay | Admitting: Cardiology

## 2021-08-03 ENCOUNTER — Telehealth: Payer: Self-pay | Admitting: Cardiology

## 2021-08-03 VITALS — BP 144/72 | HR 71 | Ht <= 58 in | Wt 135.0 lb

## 2021-08-03 DIAGNOSIS — I25119 Atherosclerotic heart disease of native coronary artery with unspecified angina pectoris: Secondary | ICD-10-CM | POA: Diagnosis not present

## 2021-08-03 DIAGNOSIS — E782 Mixed hyperlipidemia: Secondary | ICD-10-CM | POA: Diagnosis not present

## 2021-08-03 DIAGNOSIS — I6523 Occlusion and stenosis of bilateral carotid arteries: Secondary | ICD-10-CM

## 2021-08-03 NOTE — Telephone Encounter (Signed)
Checking percert on the following patient for testing scheduled at [facility].    CARTOID   08/30/2021

## 2021-08-03 NOTE — Patient Instructions (Signed)
Medication Instructions:  Your physician recommends that you continue on your current medications as directed. Please refer to the Current Medication list given to you today.  *If you need a refill on your cardiac medications before your next appointment, please call your pharmacy*   Lab Work: Fasting Lipid Panel If you have labs (blood work) drawn today and your tests are completely normal, you will receive your results only by: [place] (if you have MyChart) OR A paper copy in the mail If you have any lab test that is abnormal or we need to change your treatment, we will call you to review the results.   Testing/Procedures: Your physician has requested that you have a carotid duplex. This test is an ultrasound of the carotid arteries in your neck. It looks at blood flow through these arteries that supply the brain with blood. Allow one hour for this exam. There are no restrictions or special instructions.    Follow-Up: At [facility], you and your health needs are our priority.  As part of our continuing mission to provide you with exceptional heart care, we have created designated Provider Care Teams.  These Care Teams include your primary Cardiologist (physician) and Advanced Practice Providers (APPs -  Physician Assistants and Nurse Practitioners) who all work together to provide you with the care you need, when you need it.  We recommend signing up for the patient portal called "MyChart".  Sign up information is provided on this After Visit Summary.  MyChart is used to connect with patients for Virtual Visits (Telemedicine).  Patients are able to view lab/test results, encounter notes, upcoming appointments, etc.  Non-urgent messages can be sent to your provider as well.   To learn more about what you can do with MyChart, go to [email or web address].    Your next appointment:   6 month(s)  The format for your next appointment:   In Person  Provider:   Rozann Lesches, MD   Other Instructions

## 2021-08-03 NOTE — Progress Notes (Signed)
Cardiology Office Note  Date: 08/03/2021   ID: Victoria, Townsend Dec 31, 1960, MRN [id number]  PCP:  Leeanne Rio, MD  Cardiologist:  Rozann Lesches, MD Electrophysiologist:  None   Chief Complaint  Patient presents with   Cardiac follow-up    History of Present Illness: Victoria Townsend is a 60 y.o. female last seen in January.  She presents for a follow-up visit.  Reports multiple concerns today.  She states that she has been experiencing chronic pain all over but also specifically complaining of left shoulder pain with movement.  Also very anxious.  She does have intermittent chest pain and takes nitroglycerin occasionally for this.  She states that she feels as if her COPD is also acting up and requesting steroids.  She is worried about the status of her carotid arteries and also whether her cholesterol has increased.  I reviewed her medications with are noted below.  Cardiac regimen includes aspirin, Lipitor, and [name].  She did not tolerate Imdur previously due to headaches. Last LDL was 79 January 2021.  She had evidence of mild carotid atherosclerosis by Dopplers in July 2020.  I personally reviewed her ECG today which shows sinus bradycardia, no significant ST-T changes.  Past Medical History:  Diagnosis Date   Agoraphobia    Anxiety disorder    Asthma    Bladder polyps    CAD (coronary artery disease)    Cardiac CTA June 2020   Essential hypertension    GERD (gastroesophageal reflux disease)    Herpes    Irregular heartbeat    Nephrolithiasis    Polysubstance abuse (HCC)    History of cocaine, alcohol, marijuana in the past   PTSD (post-traumatic stress disorder)    Shoulder pain    Shoulder has been injected    Past Surgical History:  Procedure Laterality Date   APPENDECTOMY     Bladder polyps     CESAREAN SECTION WITH BILATERAL TUBAL LIGATION     CHOLECYSTECTOMY     COLONOSCOPY  May 2012   Dr. Posey Pronto: scattered diverticula pancolonic   COLONOSCOPY  WITH PROPOFOL N/A 01/29/2018   Procedure: COLONOSCOPY WITH PROPOFOL;  Surgeon: Daneil Dolin, MD;  [facility];  Service: Endoscopy;  Laterality: N/A;   ESOPHAGOGASTRODUODENOSCOPY  April 2012   Dr. Posey Pronto: gastritis   ESOPHAGOGASTRODUODENOSCOPY N/A 08/09/2013   [id number] polyps/abnormal gastric because of uncertain significance-status post biopsy. Small hiatal PrismBlog.es chronic inflammation on bx. hyperplastic polyp   ESOPHAGOGASTRODUODENOSCOPY (EGD) WITH PROPOFOL N/A 01/29/2018   Procedure: ESOPHAGOGASTRODUODENOSCOPY (EGD) WITH PROPOFOL;  Surgeon: Daneil Dolin, MD;  [facility];  Service: Endoscopy;  Laterality: N/A;  1:30   MALONEY DILATION N/A 01/29/2018   Procedure: Venia Minks DILATION;  Surgeon: Daneil Dolin, MD;  [facility];  Service: Endoscopy;  Laterality: N/A;   URETHRAL STRICTURE DILATATION     WRIST FRACTURE SURGERY Right     Current Outpatient Medications  Medication Sig Dispense Refill   acetaminophen (TYLENOL) 500 MG tablet Take 1,000 mg by mouth every 8 (eight) hours as needed for moderate pain.     albuterol (VENTOLIN HFA) 108 (90 Base) MCG/ACT inhaler Inhale 2 puffs into the lungs every 6 (six) hours as needed for shortness of breath.     aspirin 81 MG EC tablet Take 81 mg by mouth daily as needed (for chest pain).     atorvastatin (LIPITOR) 10 MG tablet Take 1 tablet by mouth at bedtime.  cholecalciferol (VITAMIN D3) 25 MCG (1000 UNIT) tablet Take 1,000 Units by mouth daily.     clonazePAM (KLONOPIN) 1 MG tablet Take 1 mg by mouth 2 (two) times daily as needed.     gabapentin (NEURONTIN) 400 MG capsule Take 400 mg by mouth 3 (three) times daily.     hydrocortisone 2.5 % ointment APPLY AS DIRECTED DAILY AS NEEDED FOR ITCHING. 28.35 g 2   lidocaine (LIDODERM) 5 % Place 1 patch onto the skin every 12 (twelve) hours.     Multiple Vitamin (MULTI-DAY PO) Take by mouth.     nitroGLYCERIN (NITROSTAT) 0.4 MG SL tablet DISSOLVE 1 TABLET UNDER  THE TONGUE EVERY 5 MINUTES AS NEEDED FOR CHEST PAIN. DO NOT EXCEED A TOTAL OF 3 DOSES IN 15 MINUTES. 25 tablet 1   oxybutynin (DITROPAN) 5 MG tablet Take 2.5 mg by mouth as needed.   3   pantoprazole (PROTONIX) 20 MG tablet Take 20 mg by mouth daily.     RESTASIS 0.05 % ophthalmic emulsion Place 1 drop into both eyes 2 (two) times daily.     valACYclovir (VALTREX) 1000 MG tablet Take 500 mg by mouth daily.     verapamil (CALAN-SR) 240 MG CR tablet Take 240 mg by mouth daily.     vitamin C (ASCORBIC ACID) 500 MG tablet Take 500 mg by mouth daily.     ALPRAZolam (XANAX) 1 MG tablet Take 1 mg by mouth 4 (four) times daily.  (Patient not taking: Reported on 08/03/2021)     No current facility-administered medications for this visit.   Allergies:  Ciprofloxacin, Codeine, Ibuprofen, Imdur [isosorbide nitrate], Mucinex [guaifenesin er], Roxicodone [oxycodone], Symbicort [budesonide-formoterol fumarate], Erythromycin, and Shellfish allergy   ROS: No syncope.  Physical Exam: VS:  BP (!) 144/72   Pulse 71   Ht '4\' 10"'$  (1.473 m)   Wt 135 lb (61.2 kg)   SpO2 98%   BMI 28.22 kg/m , BMI Body mass index is 28.22 kg/m.  Wt Readings from Last 3 Encounters:  08/03/21 135 lb (61.2 kg)  12/19/20 136 lb (61.7 kg)  12/06/20 137 lb 6.4 oz (62.3 kg)    General: Patient appears comfortable at rest. HEENT: Conjunctiva and lids normal, wearing a mask. Neck: Supple, no elevated JVP or carotid bruits, no thyromegaly. Lungs: No active wheezing, nonlabored breathing at rest. Cardiac: Regular rate and rhythm, no S3 or significant systolic murmur. Extremities: No pitting edema.  ECG:  An ECG dated 12/19/2020 was personally reviewed today and demonstrated:  Sinus rhythm.  Recent Labwork:  April 2022: High-sensitivity troponin I 7, hemoglobin 13.7, platelets 257, potassium 4.2, BUN 10, creatinine 0.74, AST 18, ALT 16, INR 0.79, pro-BNP 166 June 2022: BUN 13, creatinine 0.73  Other Studies Reviewed  Today:  Cardiac CTA 05/20/2019: FINDINGS: Coronary calcium score: The patient's coronary artery calcium score is 443, which places the patient in the 99 percentile.   Coronary arteries: Normal coronary origins.  Right dominance.   Right Coronary Artery: Mild mixed atherosclerotic plaque in the proximal RCA, 25-49% stenosis. Moderate mixed atherosclerotic plaque in the mid RCA, 50-69%. Severe mixed atherosclerotic plaque in the distal RCA, 70-99% stenosis. Patent PDA and PL with diffuse minimal plaque.   Left Main Coronary Artery: No detectable plaque or stenosis.   Left Anterior Descending Coronary Artery: Mild mixed atherosclerotic plaque in the proximal LAD, 25-49% stenosis. Moderate mixed atherosclerotic plaque in the mid LAD, 50-69%. Moderate mixed atherosclerotic plaque in the distal LAD 50-69%. Patent diagonal branches, mild mixed  atherosclerotic plaque in the mid LAD at the takeoff of the first diagonal branch.   Left Circumflex Artery: Moderate mixed atherosclerotic plaque in the proximal L circumflex 50-69%. Possible severe atherosclerotic plaque in the proximal-distal L circumflex, 70-99%, at the bifurcation of medium caliber OM2. OM1 is small caliber but patent.   Aorta: Normal size, 26 mm at the mid ascending aorta (level of the PA bifurcation) measured double oblique. No calcifications. No dissection.   Aortic Valve: No calcifications.   Other findings:   Normal pulmonary vein drainage into the left atrium.   Normal left atrial appendage without a thrombus.   Normal size of the pulmonary artery.   IMPRESSION: 1. Severe CAD in the distal RCA and proximal-distal circumflex artery, CADRADS = 4. CT FFR analysis will be performed and reported separately.   2. The patient's coronary artery calcium score is 443, which places the patient in the 69 percentile for age and sex matched control.   3. Normal coronary origin with right dominance.   Cardiac CT FFR  05/20/2019: 1. Left Main:  No significant stenosis. FFR = 0.97   2. LAD: Proximal FFR = 0.95, Mid FFR = 0.81, Distal FFR = 0.73 3. LCX: Proximal FFR = 0.94, Distal FFR = 0.85, OM 2 = 0.84 4. RCA: Proximal FFR = 0.96, Mid FFR =0.87, Distal FFR = 0.84, PDA not mapped   IMPRESSION: 1. CT FFR analysis demonstrates possible hemodynamically significant lesion in the distal LAD. Vessel diameter just proximal to this lesion is 2.65 mm. Consider aggressive medical management for distal CAD.  Carotid Dopplers 06/02/2019: Summary:  Right Carotid: Velocities in the right ICA are consistent with a 1-39%  stenosis.   Left Carotid: Velocities in the left ICA are consistent with a 1-39%  stenosis.   Vertebrals:  Bilateral vertebral arteries demonstrate antegrade flow.  Subclavians: Normal flow hemodynamics were seen in bilateral subclavian               arteries.   Assessment and Plan:  1.  Multivessel CAD by cardiac CTA with most significant disease involving the distal LAD in small caliber segment best managed medically.  She does report intermittent angina symptoms or nitroglycerin use.  Was not able to tolerate Imdur due to headaches.  Currently on aspirin, Lipitor, and [name].  Could potentially consider Ranexa if needed, but she would need to come off [name].  This could potentially be replaced with bisoprolol.  For now no changes.  2.  Carotid artery disease, we will obtain follow-up carotid Dopplers for reevaluation.  Continue aspirin and statin.  3.  Mixed hyperlipidemia, due for follow-up fasting lipid profile.  Her last LDL was 79 in January 2021.  Continue Lipitor.  Medication Adjustments/Labs and Tests Ordered: Current medicines are reviewed at length with the patient today.  Concerns regarding medicines are outlined above.   Tests Ordered: Orders Placed This Encounter  Procedures   US Carotid Duplex Bilateral   Lipid panel   EKG 12-Lead     Medication Changes: No orders  of the defined types were placed in this encounter.   Disposition:  Follow up  6 months.  Signed, Satira Sark, MD, [facility] 08/03/2021 1:46 PM    [place] at [place], [place], [place] [address] Phone: [phone]; Fax: [phone]

## 2021-08-06 ENCOUNTER — Ambulatory Visit: Payer: Medicaid Other | Admitting: Neurology

## 2021-08-16 ENCOUNTER — Ambulatory Visit: Payer: Medicaid Other | Admitting: Neurology

## 2021-08-23 ENCOUNTER — Ambulatory Visit: Payer: Medicaid Other | Admitting: Nurse Practitioner

## 2021-08-28 ENCOUNTER — Ambulatory Visit (INDEPENDENT_AMBULATORY_CARE_PROVIDER_SITE_OTHER): Payer: Medicaid Other | Admitting: Physician Assistant

## 2021-08-28 ENCOUNTER — Encounter: Payer: Self-pay | Admitting: Physician Assistant

## 2021-08-28 VITALS — BP 140/70 | HR 76 | Ht 58.5 in | Wt 137.4 lb

## 2021-08-28 DIAGNOSIS — K219 Gastro-esophageal reflux disease without esophagitis: Secondary | ICD-10-CM | POA: Diagnosis not present

## 2021-08-28 DIAGNOSIS — R1011 Right upper quadrant pain: Secondary | ICD-10-CM | POA: Diagnosis not present

## 2021-08-28 DIAGNOSIS — G8929 Other chronic pain: Secondary | ICD-10-CM

## 2021-08-28 NOTE — Patient Instructions (Signed)
You have been scheduled for an endoscopy. Please follow written instructions given to you at your visit today. If you use inhalers (even only as needed), please bring them with you on the day of your procedure.  If you are age 61 or older, your body mass index should be between 23-30. Your Body mass index is 28.22 kg/m. If this is out of the aforementioned range listed, please consider follow up with your Primary Care Provider.  If you are age 23 or younger, your body mass index should be between 19-25. Your Body mass index is 28.22 kg/m. If this is out of the aformentioned range listed, please consider follow up with your Primary Care Provider.   __________________________________________________________  The [facility] providers would like to encourage you to use [facility] to communicate with providers for non-urgent requests or questions.  Due to long hold times on the telephone, sending your provider a message by [facility] may be a faster and more efficient way to get a response.  Please allow 48 business hours for a response.  Please remember that this is for non-urgent requests.

## 2021-08-28 NOTE — Progress Notes (Signed)
Reviewed.  Yuktha Kerchner L. Augusta Hilbert, MD, MPH  

## 2021-08-28 NOTE — Progress Notes (Signed)
Chief Complaint: "Stomach burning"  HPI:    Victoria Townsend is a 60 year old female, previously followed with Dr. Laural Golden in [place], with a past medical history of anxiety, CAD, GERD, PTSD and polysubstance abuse, who was referred to me by Leeanne Rio, MD for a complaint of "stomach burning".    09/02/2019 patient seen in clinic by Carl Best, NP for follow-up with right upper quadrant abdominal pain.  She had not had any pain since her last visit.  That time was noted she had chronic right upper quadrant pain with a normal EGD 3/19 as well as CT of the abdomen pelvis without identifying cause, abdominal Doppler/duplex normal, status postcholecystectomy.  Is felt possibly this pain is from adhesions from prior surgery or internal hernia.  This had improved at that visit.  She was continued on Dicyclomine 10 mg twice daily.  Her reflux was stable.  Her colonoscopy was recommended to be repeated in March 2024 given a family history of colon cancer.    10/19/2019 MRI of the abdomen with and without was image degraded by motion artifact, no mass or other significant abnormality.    02/07/2020 patient seen by digestive health for upper abdominal pain.  She described pain for 8 years which she thought may be related to "scar tissue on the inside", described intermittent pain that felt like a "knot", this is exacerbated by bending over and with movement in general.  Patient had previously being given a trial of Amitriptyline and a referral to pain management was recommended however she had not tried either of these as she had not seen help from Elavil in the past and had previously followed with pain management.  At that time was recommended the patient trial Voltaren gel has a large component of her symptoms was musculoskeletal.  She was referred to pain management for consideration of trigger point injections.    Today, the patient presents to clinic and has the same complaint of chronic right  upper quadrant pain which seems to radiate over to her epigastrium, believes this has worsened over the past 9 years and "no one can figure out what is wrong".  When touching this on her abdomen it is right over her surgical scar from previous cholecystectomy.  Tells me that she takes Pantoprazole 40 mg twice a day which seems to take care of her reflux symptoms.  Normal bowel habits.  Tells me she never got any abdominal wall injections but has tried the Elavil in the past and it did not work.  She tells me "nothing they have ever given me has worked".  Describes his pain as sometimes a 9-10/10, sharp and burning, she is not exactly sure what makes it worse but sometimes it is movement and other times it is food.  She feels it on most days.  Tells me "if I have that much pain someone should be other figure out what is going on".  Describes all of her previous imaging which never found any etiology.    Patient also relays a a lot of family information in regards to multiple family members who have overdosed or had trouble with drug abuse/addiction.  Also her daughter who has essentially disowned her and went to live with her ex-husband who also has drug problems.  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 Tells me she reads her bible and doesn't mess with drugs or alcohol.    Denies fever, chills, weight loss, blood in  her stool or symptoms that awaken her from sleep.  Previous GI work-up: CT abdomen and pelvis with contrast 07/05/2019: Two nonobstructing bilateral renal calculi measuring up to 3 mm. No Hydronephrosis.  Status post cholecystectomy and appendectomy. No evidence of bowel obstruction.  No CT findings to account for the patient's chronic upper abdominal pain.   EGD 02/08/2018: - Normal esophagus. Dilated with a Maloney dilator with mild resistance at 26 Fr. - Normal stomach. - Normal duodenal bulb and second portion of the duodenum. - No specimens collected.   EGD  08/09/2013: small hiatal hernia and a few 1-54mm gastric hyperplastic polyps. No H. Pylori.   Colonoscopy 02/08/2018: Adequate prep - Internal hemorrhoids. - One 4 mm tubular adenomatous polyp at the splenic flexure removed. - Diverticulosis in the sigmoid colon and in the descending colon. -16yr recall.   Past Medical History:  Diagnosis Date   Agoraphobia    Anxiety disorder    Asthma    Bladder polyps    CAD (coronary artery disease)    Cardiac CTA June 2020   Essential hypertension    GERD (gastroesophageal reflux disease)    Herpes    Irregular heartbeat    Nephrolithiasis    Polysubstance abuse (HCC)    History of cocaine, alcohol, marijuana in the past   PTSD (post-traumatic stress disorder)    Shoulder pain    Shoulder has been injected    Past Surgical History:  Procedure Laterality Date   APPENDECTOMY     Bladder polyps     CESAREAN SECTION WITH BILATERAL TUBAL LIGATION     CHOLECYSTECTOMY     COLONOSCOPY  May 2012   Dr. Posey Pronto: scattered diverticula pancolonic   COLONOSCOPY WITH PROPOFOL N/A 01/29/2018   Procedure: COLONOSCOPY WITH PROPOFOL;  Surgeon: Daneil Dolin, MD;  Location: [facility];  Service: Endoscopy;  Laterality: N/A;   ESOPHAGOGASTRODUODENOSCOPY  April 2012   Dr. Posey Pronto: gastritis   ESOPHAGOGASTRODUODENOSCOPY N/A 08/09/2013   [facility]:ALPFXTK polyps/abnormal gastric because of uncertain significance-status post biopsy. Small hiatal [employer].es chronic inflammation on bx. hyperplastic polyp   ESOPHAGOGASTRODUODENOSCOPY (EGD) WITH PROPOFOL N/A 01/29/2018   Procedure: ESOPHAGOGASTRODUODENOSCOPY (EGD) WITH PROPOFOL;  Surgeon: Daneil Dolin, MD;  Location: [facility];  Service: Endoscopy;  Laterality: N/A;  1:30   MALONEY DILATION N/A 01/29/2018   Procedure: Venia Minks DILATION;  Surgeon: Daneil Dolin, MD;  Location: [facility];  Service: Endoscopy;  Laterality: N/A;   URETHRAL STRICTURE DILATATION     WRIST FRACTURE SURGERY Right     Current  Outpatient Medications  Medication Sig Dispense Refill   acetaminophen (TYLENOL) 500 MG tablet Take 1,000 mg by mouth every 8 (eight) hours as needed for moderate pain.     albuterol (VENTOLIN HFA) 108 (90 Base) MCG/ACT inhaler Inhale 2 puffs into the lungs every 6 (six) hours as needed for shortness of breath.     ALPRAZolam (XANAX) 1 MG tablet Take 1 mg by mouth 4 (four) times daily.  (Patient not taking: Reported on 08/03/2021)     aspirin 81 MG EC tablet Take 81 mg by mouth daily as needed (for chest pain).     atorvastatin (LIPITOR) 10 MG tablet Take 1 tablet by mouth at bedtime.     cholecalciferol (VITAMIN D3) 25 MCG (1000 UNIT) tablet Take 1,000 Units by mouth daily.     clonazePAM (KLONOPIN) 1 MG tablet Take 1 mg by mouth 2 (two) times daily as needed.     gabapentin (NEURONTIN) 400 MG  capsule Take 400 mg by mouth 3 (three) times daily.     hydrocortisone 2.5 % ointment APPLY AS DIRECTED DAILY AS NEEDED FOR ITCHING. 28.35 g 2   lidocaine (LIDODERM) 5 % Place 1 patch onto the skin every 12 (twelve) hours.     Multiple Vitamin (MULTI-DAY PO) Take by mouth.     nitroGLYCERIN (NITROSTAT) 0.4 MG SL tablet DISSOLVE 1 TABLET UNDER THE TONGUE EVERY 5 MINUTES AS NEEDED FOR CHEST PAIN. DO NOT EXCEED A TOTAL OF 3 DOSES IN 15 MINUTES. 25 tablet 1   oxybutynin (DITROPAN) 5 MG tablet Take 2.5 mg by mouth as needed.   3   pantoprazole (PROTONIX) 20 MG tablet Take 20 mg by mouth daily.     RESTASIS 0.05 % ophthalmic emulsion Place 1 drop into both eyes 2 (two) times daily.     valACYclovir (VALTREX) 1000 MG tablet Take 500 mg by mouth daily.     verapamil (CALAN-SR) 240 MG CR tablet Take 240 mg by mouth daily.     vitamin C (ASCORBIC ACID) 500 MG tablet Take 500 mg by mouth daily.     No current facility-administered medications for this visit.    Allergies as of 08/28/2021 - Review Complete 08/03/2021  Allergen Reaction Noted   Ciprofloxacin Swelling and Other (See Comments) 04/12/2013   Codeine  Nausea And Vomiting    Ibuprofen Other (See Comments)    Imdur [isosorbide nitrate] Other (See Comments) 06/22/2019   Mucinex [guaifenesin er] Nausea And Vomiting 01/21/2018   Roxicodone [oxycodone] Swelling 01/21/2018   Symbicort [budesonide-formoterol fumarate] Other (See Comments) 06/21/2020   Erythromycin Other (See Comments) 08/16/2014   Shellfish allergy Rash 04/12/2013    Family History  Problem Relation Age of Onset   Coronary artery disease Mother        CABG   Colon cancer Paternal Uncle 35   Stomach cancer Paternal Grandmother 86   Stomach cancer Maternal Aunt 60   Breast cancer Paternal Aunt    Ovarian cancer Maternal Aunt    Liver cancer Sister    Lung cancer Sister     Social History   Socioeconomic History   Marital status: Single    Spouse name: Not on file   Number of children: Not on file   Years of education: Not on file   Highest education level: Not on file  Occupational History   Not on file  Tobacco Use   Smoking status: Every Day    Packs/day: 0.50    Years: 45.00    Pack years: 22.50    Types: Cigarettes    Start date: 06/10/1974   Smokeless tobacco: Never  Vaping Use   Vaping Use: Never used  Substance and Sexual Activity   Alcohol use: No    Alcohol/week: 0.0 standard drinks    Comment: Former user, heavy etoh use prior to 1990s.   Drug use: Not Currently    Types: Marijuana, Cocaine    Comment: 04/13/19-no cocaine; denies marijuana   Sexual activity: Not on file  Other Topics Concern   Not on file  Social History Narrative   ** Merged History Encounter **       Social Determinants of Health   Financial Resource Strain: Not on file  Food Insecurity: Not on file  Transportation Needs: Not on file  Physical Activity: Not on file  Stress: Not on file  Social Connections: Not on file  Intimate Partner Violence: Not on file    Review of Systems:  Constitutional: No weight loss, fever or chills Skin: No rash Cardiovascular:  No chest pain Respiratory: No SOB  Gastrointestinal: See HPI and otherwise negative Genitourinary: No dysuria  Neurological: No headache, dizziness or syncope Musculoskeletal: No new muscle or joint pain Hematologic: No bleeding or bruising Psychiatric: +depression and anxiety   Physical Exam:  Vital signs: BP 140/70 (BP Location: Left Arm, Patient Position: Sitting, Cuff Size: Normal)   Pulse 76   Ht 4' 10.5" (1.486 m) Comment: height measured without shoes  Wt 137 lb 6 oz (62.3 kg)   BMI 28.22 kg/m    Constitutional:   Caucasian female appears to be in NAD, Well developed, Well nourished, alert and cooperative Head:  Normocephalic and atraumatic. Eyes:   PEERL, EOMI. No icterus. Conjunctiva pink. Ears:  Normal auditory acuity. Neck:  Supple Throat: Oral cavity and pharynx without inflammation, swelling or lesion.  Respiratory: Respirations even and unlabored. Lungs clear to auscultation bilaterally.   No wheezes, crackles, or rhonchi.  Cardiovascular: Normal S1, S2. No MRG. Regular rate and rhythm. No peripheral edema, cyanosis or pallor.  Gastrointestinal:  Soft, nondistended, + mild TTP over right upper quadrant/epigastrium with deep palpation. No rebound or guarding. Normal bowel sounds. No appreciable masses or hepatomegaly. Rectal:  Not performed.  Msk:  Symmetrical without gross deformities. Without edema, no deformity or joint abnormality.  Neurologic:  Alert and  oriented x4;  grossly normal neurologically.  Skin:   Dry and intact without significant lesions or rashes. Psychiatric: + Anxious, tearful  RELEVANT LABS AND IMAGING: CBC    Component Value Date/Time   WBC 7.0 06/18/2019 1333   RBC 4.19 06/18/2019 1333   HGB 13.1 06/18/2019 1333   HCT 40.4 06/18/2019 1333   PLT 251 06/18/2019 1333   MCV 96.4 06/18/2019 1333   MCH 31.3 06/18/2019 1333   MCHC 32.4 06/18/2019 1333   RDW 12.3 06/18/2019 1333   LYMPHSABS 2.9 06/18/2019 1333   MONOABS 0.5 06/18/2019 1333    EOSABS 0.2 06/18/2019 1333   BASOSABS 0.0 06/18/2019 1333    CMP     Component Value Date/Time   NA 138 01/27/2018 1105   K 3.1 (L) 01/27/2018 1105   CL 102 01/27/2018 1105   CO2 22 01/27/2018 1105   GLUCOSE 78 01/27/2018 1105   BUN 7 01/27/2018 1105   CREATININE 0.80 02/14/2020 1210   CALCIUM 9.6 01/27/2018 1105   PROT 7.6 06/18/2019 1333   ALBUMIN 4.3 06/18/2019 1333   AST 27 06/18/2019 1333   ALT 14 06/18/2019 1333   ALKPHOS 61 06/18/2019 1333   BILITOT 1.3 (H) 06/18/2019 1333   GFRNONAA >60 01/27/2018 1105   GFRAA >60 01/27/2018 1105    Assessment: 1.  Chronic right upper quadrant/epigastric pain: See HPI, large work-up for this in the past, last around 2019/2020, normal imaging multiple times, normal EGD and colonoscopy as well as ultrasound, CTs and MRIs, she has seen 3 prior GI physicians with the last telling her to try abdominal wall injections for pain; consider relation to adhesions and fibromyalgia most likely versus worsening gastritis at this point? 2.  GERD: Controlled with Pantoprazole 40 mg twice daily  Plan: 1.  Discussed with patient that we can repeat her EGD to see if anything has changed as as it has been 3 years since her last.  Scheduled her with Dr. Tarri Glenn in the [facility].  Did provide the patient a detailed list of risks for the procedure and she agrees to proceed. Patient is appropriate for  endoscopic procedure(s) in the ambulatory ([place]) setting.  2.  Discussed with patient that if we do not find anything on EGD would recommend referral again for pain injections in this area.  If it is related to adhesions/firbormyalgia this may help.  I think this pain most likely is psychosomatic/IBS/functional. 3.  Continue Pantoprazole 40 twice daily 4.  Patient to follow in clinic per recommendations from Dr. Tarri Glenn after time of procedure  Ellouise Newer, PA-C [facility] 08/28/2021, 2:14 PM  Cc: Leeanne Rio, MD

## 2021-08-30 ENCOUNTER — Other Ambulatory Visit: Payer: Self-pay

## 2021-08-30 ENCOUNTER — Ambulatory Visit (HOSPITAL_COMMUNITY)
Admission: RE | Admit: 2021-08-30 | Discharge: 2021-08-30 | Disposition: A | Discharge status: Home / Self Care | Payer: Medicaid Other | Source: Ambulatory Visit | Attending: Cardiology | Admitting: Cardiology

## 2021-08-30 DIAGNOSIS — I25119 Atherosclerotic heart disease of native coronary artery with unspecified angina pectoris: Secondary | ICD-10-CM | POA: Diagnosis not present

## 2021-09-03 ENCOUNTER — Telehealth: Payer: Self-pay | Admitting: *Deleted

## 2021-09-03 ENCOUNTER — Ambulatory Visit: Payer: Medicaid Other | Admitting: Neurology

## 2021-09-03 NOTE — Telephone Encounter (Signed)
Patient informed. Copy sent to PCP °

## 2021-09-03 NOTE — Telephone Encounter (Signed)
-----   Message from Satira Sark, MD sent at 08/30/2021  2:34 PM EDT ----- Results reviewed.  Please let her know that the carotid Doppler showed only a mild degree of carotid atherosclerosis with no significant blockages.  Would continue medical therapy and observation.

## 2021-10-10 ENCOUNTER — Other Ambulatory Visit: Payer: Self-pay

## 2021-10-10 ENCOUNTER — Encounter: Payer: Self-pay | Admitting: Gastroenterology

## 2021-10-10 ENCOUNTER — Ambulatory Visit (AMBULATORY_SURGERY_CENTER): Payer: Medicaid Other | Admitting: Gastroenterology

## 2021-10-10 VITALS — BP 142/69 | HR 60 | Temp 97.5°F | Resp 13 | Ht <= 58 in | Wt 137.0 lb

## 2021-10-10 DIAGNOSIS — K317 Polyp of stomach and duodenum: Secondary | ICD-10-CM

## 2021-10-10 DIAGNOSIS — K449 Diaphragmatic hernia without obstruction or gangrene: Secondary | ICD-10-CM

## 2021-10-10 DIAGNOSIS — K2990 Gastroduodenitis, unspecified, without bleeding: Secondary | ICD-10-CM

## 2021-10-10 DIAGNOSIS — K3189 Other diseases of stomach and duodenum: Secondary | ICD-10-CM

## 2021-10-10 DIAGNOSIS — G8929 Other chronic pain: Secondary | ICD-10-CM

## 2021-10-10 DIAGNOSIS — K2970 Gastritis, unspecified, without bleeding: Secondary | ICD-10-CM | POA: Diagnosis not present

## 2021-10-10 DIAGNOSIS — R1011 Right upper quadrant pain: Secondary | ICD-10-CM | POA: Diagnosis not present

## 2021-10-10 DIAGNOSIS — K219 Gastro-esophageal reflux disease without esophagitis: Secondary | ICD-10-CM

## 2021-10-10 MED ORDER — SODIUM CHLORIDE 0.9 % IV SOLN: 500.0000 mL | Freq: Once | INTRAVENOUS | Status: DC

## 2021-10-10 NOTE — Op Note (Addendum)
[place] Patient Name: Victoria Townsend Procedure Date: 10/10/2021 1:04 PM MRN: [id number] Endoscopist: Thornton Park MD, MD Age: 60 Referring MD:  Date of Birth: 1961-07-26 Gender: Female Account #: [id number] Procedure:                Upper GI endoscopy Indications:              Abdominal pain in the right upper quadrant x 10                            years                           Patient concerns about undiagnosed gastric cancer Medicines:                Monitored Anesthesia Care Procedure:                Pre-Anesthesia Assessment:                           - Prior to the procedure, a History and Physical                            was performed, and patient medications and                            allergies were reviewed. The patient's tolerance of                            previous anesthesia was also reviewed. The risks                            and benefits of the procedure and the sedation                            options and risks were discussed with the patient.                            All questions were answered, and informed consent                            was obtained. Prior Anticoagulants: The patient has                            taken no previous anticoagulant or antiplatelet                            agents. ASA Grade Assessment: II - A patient with                            mild systemic disease. After reviewing the risks                            and benefits, the patient was deemed in  satisfactory condition to undergo the procedure.                           After obtaining informed consent, the endoscope was                            passed under direct vision. Throughout the                            procedure, the patient's blood pressure, pulse, and                            oxygen saturations were monitored continuously. The                            Endoscope was introduced through the mouth, and                             advanced to the third part of duodenum. The upper                            GI endoscopy was accomplished without difficulty.                            The patient tolerated the procedure well. Scope In: Scope Out: Findings:                 The Z-line was irregular and was found 36 cm from                            the incisors. Biopsies were taken from the distal                            esophagus with a cold forceps for histology.                            Estimated blood loss was minimal.                           Patchy mild inflammation characterized by erythema,                            friability and granularity was found in the gastric                            body. Biopsies were taken from the antrum, body,                            and fundus with a cold forceps for histology.                            Estimated blood loss was minimal.  A small hiatal hernia was present.                           A few small sessile polyps were found in the                            gastric body. These have the appearance of fundic                            gland polyps. Biopsies were taken with a cold                            forceps for histology. Estimated blood loss was                            minimal.                           The examined duodenum was normal. Biopsies were                            taken with a cold forceps for histology. Estimated                            blood loss was minimal.                           The exam was otherwise without abnormality. Complications:            No immediate complications. Estimated blood loss:                            Minimal. Estimated Blood Loss:     Estimated blood loss was minimal. Impression:               - Z-line irregular, 36 cm from the incisors.                            Biopsied.                           - Gastritis. Biopsied.                            - Small hiatal hernia.                           - Normal examined duodenum. Biopsied.                           - The examination was otherwise normal. Recommendation:           - Patient has a contact number available for                            emergencies. The signs and symptoms of potential  delayed complications were discussed with the                            patient. Return to normal activities tomorrow.                            Written discharge instructions were provided to the                            patient.                           - Resume previous diet.                           - Continue present medications.                           - Await pathology results.                           - No aspirin, ibuprofen, naproxen, or other                            non-steroidal anti-inflammatory drugs.                           - Reassurance provided to the patient that her 10                            year history of abdominal pain with multiple                            imaging studies and endoscopic evaluation is not                            thought to be cancer. Thornton Park MD, MD 10/10/2021 1:32:30 PM This report has been signed electronically.

## 2021-10-10 NOTE — Progress Notes (Signed)
VS completed by CW.  Medical history reviewed and updated.

## 2021-10-10 NOTE — Progress Notes (Signed)
Referring Provider: Leeanne Rio, MD Primary Care Physician:  Leeanne Rio, MD  Reason for Procedure:  RUQ pain  IMPRESSION:  Chronic abdominal pain -  RUQ>LUQ pain Appropriate candidate for monitored anesthesia care  PLAN: EGD in the [place] today   HPI: Victoria Townsend is a 60 y.o. female presents for endoscopic evaluation of abdominal pain. Please see Candie Mile office note from 08/28/21 for full details.    Past Medical History:  Diagnosis Date   Agoraphobia    Anxiety disorder    Arthritis    Asthma    Bladder polyps    CAD (coronary artery disease)    Cardiac CTA June 2020   Colon polyps    COPD (chronic obstructive pulmonary disease) (HCC)    Depression    Emphysema lung (HCC)    Essential hypertension    Fibromyalgia    Gallstones    GERD (gastroesophageal reflux disease)    Herpes    Irregular heartbeat    Nephrolithiasis    Polysubstance abuse (HCC)    History of cocaine, alcohol, marijuana in the past   PTSD (post-traumatic stress disorder)    Shoulder pain    Shoulder has been injected    Past Surgical History:  Procedure Laterality Date   APPENDECTOMY     Bladder polyps     CESAREAN SECTION WITH BILATERAL TUBAL LIGATION     CHOLECYSTECTOMY     COLONOSCOPY  May 2012   Dr. Posey Pronto: scattered diverticula pancolonic   COLONOSCOPY WITH PROPOFOL N/A 01/29/2018   Procedure: COLONOSCOPY WITH PROPOFOL;  Surgeon: Daneil Dolin, MD;  [facility];  Service: Endoscopy;  Laterality: N/A;   ESOPHAGOGASTRODUODENOSCOPY  April 2012   Dr. Posey Pronto: gastritis   ESOPHAGOGASTRODUODENOSCOPY N/A 08/09/2013   RDE:[id number] polyps/abnormal gastric because of uncertain significance-status post biopsy. Small hiatal PrismBlog.es chronic inflammation on bx. hyperplastic polyp   ESOPHAGOGASTRODUODENOSCOPY (EGD) WITH PROPOFOL N/A 01/29/2018   Procedure: ESOPHAGOGASTRODUODENOSCOPY (EGD) WITH PROPOFOL;  Surgeon: Daneil Dolin, MD;  [facility];   Service: Endoscopy;  Laterality: N/A;  1:30   MALONEY DILATION N/A 01/29/2018   Procedure: Venia Minks DILATION;  Surgeon: Daneil Dolin, MD;  [facility]: [facility];  Service: Endoscopy;  Laterality: N/A;   URETHRAL STRICTURE DILATATION     WRIST FRACTURE SURGERY Right     Current Outpatient Medications  Medication Sig Dispense Refill   acetaminophen (TYLENOL) 500 MG tablet Take 1,000 mg by mouth every 8 (eight) hours as needed for moderate pain.     albuterol (VENTOLIN HFA) 108 (90 Base) MCG/ACT inhaler Inhale 2 puffs into the lungs every 6 (six) hours as needed for shortness of breath.     ALPRAZolam (XANAX) 1 MG tablet Take 1 mg by mouth 4 (four) times daily.     aspirin 81 MG EC tablet Take 81 mg by mouth daily as needed (for chest pain).     atorvastatin (LIPITOR) 10 MG tablet Take 1 tablet by mouth at bedtime.     cholecalciferol (VITAMIN D3) 25 MCG (1000 UNIT) tablet Take 1,000 Units by mouth daily.     gabapentin (NEURONTIN) 400 MG capsule Take 400 mg by mouth 3 (three) times daily.     hydrocortisone 2.5 % ointment APPLY AS DIRECTED DAILY AS NEEDED FOR ITCHING. 28.35 g 2   lidocaine (LIDODERM) 5 % Place 1 patch onto the skin every 12 (twelve) hours.     Multiple Vitamin (MULTI-DAY PO) Take by mouth.  oxyCODONE-acetaminophen (PERCOCET/ROXICET) 5-325 MG tablet Take 1 tablet by mouth 3 (three) times daily as needed.     pantoprazole (PROTONIX) 20 MG tablet Take 20 mg by mouth daily.     RESTASIS 0.05 % ophthalmic emulsion Place 1 drop into both eyes 2 (two) times daily.     valACYclovir (VALTREX) 500 MG tablet Take 1 tablet by mouth daily.     verapamil (CALAN-SR) 240 MG CR tablet Take 240 mg by mouth daily.     vitamin C (ASCORBIC ACID) 500 MG tablet Take 500 mg by mouth daily.     nitroGLYCERIN (NITROSTAT) 0.4 MG SL tablet DISSOLVE 1 TABLET UNDER THE TONGUE EVERY 5 MINUTES AS NEEDED FOR CHEST PAIN. DO NOT EXCEED A TOTAL OF 3 DOSES IN 15 MINUTES. (Patient not taking: No sig reported)  25 tablet 1   oxybutynin (DITROPAN) 5 MG tablet Take 2.5 mg by mouth as needed.   3   Current Facility-Administered Medications  Medication Dose Route Frequency Provider Last Rate Last Admin   0.9 %  sodium chloride infusion  500 mL Intravenous Once [place], MD        Allergies as of 10/10/2021 - Review Complete 10/10/2021  Allergen Reaction Noted   Ciprofloxacin Swelling and Other (See Comments) 04/12/2013   Codeine Nausea And Vomiting    Ibuprofen Other (See Comments)    Imdur [isosorbide nitrate] Other (See Comments) 06/22/2019   Mucinex [guaifenesin er] Nausea And Vomiting 01/21/2018   Roxicodone [oxycodone] Swelling 01/21/2018   Symbicort [budesonide-formoterol fumarate] Other (See Comments) 06/21/2020   Erythromycin Other (See Comments) 08/16/2014   Shellfish allergy Rash 04/12/2013    Family History  Problem Relation Age of Onset   Coronary artery disease Mother        CABG   Diabetes Mother    Heart attack Mother    Liver cancer Sister    Alcohol abuse Sister    Drug abuse Sister    Clotting disorder Sister    Alcohol abuse Brother    Drug abuse Brother    Diabetes Brother    Diabetes Brother    Heart disease Brother    Obesity Brother    Alcohol abuse Brother    Drug abuse Brother    Stomach cancer Paternal Grandmother 11   Stomach cancer Maternal Aunt 60   Ovarian cancer Maternal Aunt    Breast cancer Paternal Aunt    Colon cancer Paternal Uncle 29   Fibromyalgia Daughter    Thyroid disease Daughter    Anxiety disorder Daughter      Physical Exam: General:   Alert,  well-nourished, pleasant and cooperative in NAD Head:  Normocephalic and atraumatic. Eyes:  Sclera clear, no icterus.   Conjunctiva pink. Mouth:  No deformity or lesions.   Neck:  Supple; no masses or thyromegaly. Lungs:  Clear throughout to auscultation.   No wheezes. Heart:  Regular rate and rhythm; no murmurs. Abdomen:  Soft, non-tender, nondistended, normal bowel sounds, no  rebound or guarding.  Msk:  Symmetrical. No boney deformities LAD: No inguinal or umbilical LAD Extremities:  No clubbing or edema. Neurologic:  Alert and  oriented x4;  grossly nonfocal Skin:  No obvious rash or bruise. Psych:  Alert and cooperative. Normal mood and affect.    [place] Chatmon L. Tarri Glenn, MD, MPH 10/10/2021, 1:11 PM

## 2021-10-10 NOTE — Progress Notes (Signed)
Report to PACU, RN, vss, BBS= Clear.  

## 2021-10-10 NOTE — Patient Instructions (Signed)
No NSAIDS" aspirin, aleve, ibuprofen.  Read all of the handouts given to you by your recovery room nurse.  YOU HAD AN ENDOSCOPIC PROCEDURE TODAY AT [facility]:   Refer to the procedure report that was given to you for any specific questions about what was found during the examination.  If the procedure report does not answer your questions, please call your gastroenterologist to clarify.  If you requested that your care partner not be given the details of your procedure findings, then the procedure report has been included in a sealed envelope for you to review at your convenience later.  YOU SHOULD EXPECT: Some feelings of bloating in the abdomen. Passage of more gas than usual.  Walking can help get rid of the air that was put into your GI tract during the procedure and reduce the bloating.   Please Note:  You might notice some irritation and congestion in your nose or some drainage.  This is from the oxygen used during your procedure.  There is no need for concern and it should clear up in a day or so.  SYMPTOMS TO REPORT IMMEDIATELY:   Following upper endoscopy (EGD)  Vomiting of blood or coffee ground material  New chest pain or pain under the shoulder blades  Painful or persistently difficult swallowing  New shortness of breath  Fever of 100F or higher  Black, tarry-looking stools  For urgent or emergent issues, a gastroenterologist can be reached at any hour by calling [phone]. Do not use MyChart messaging for urgent concerns.    DIET:  We do recommend a small meal at first, but then you may proceed to your regular diet.  Drink plenty of fluids but you should avoid alcoholic beverages for 24 hours.  ACTIVITY:  You should plan to take it easy for the rest of today and you should NOT DRIVE or use heavy machinery until tomorrow (because of the sedation medicines used during the test).    FOLLOW UP: Our staff will call the number listed on your records 48-72  hours following your procedure to check on you and address any questions or concerns that you may have regarding the information given to you following your procedure. If we do not reach you, we will leave a message.  We will attempt to reach you two times.  During this call, we will ask if you have developed any symptoms of COVID 19. If you develop any symptoms (ie: fever, flu-like symptoms, shortness of breath, cough etc.) before then, please call [phone].  If you test positive for Covid 19 in the 2 weeks post procedure, please call and report this information to [place].    If any biopsies were taken you will be contacted by phone or by letter within the next 1-3 weeks.  Please call us at [phone] if you have not heard about the biopsies in 3 weeks.    SIGNATURES/CONFIDENTIALITY: You and/or your care partner have signed paperwork which will be entered into your electronic medical record.  These signatures attest to the fact that that the information above on your After Visit Summary has been reviewed and is understood.  Full responsibility of the confidentiality of this discharge information lies with you and/or your care-partner.

## 2021-10-10 NOTE — Progress Notes (Signed)
Called to room to assist during endoscopic procedure.  Patient ID and intended procedure confirmed with present staff. Received instructions for my participation in the procedure from the performing physician.  

## 2021-10-12 ENCOUNTER — Telehealth: Payer: Self-pay

## 2021-10-12 NOTE — Telephone Encounter (Signed)
First post procedure follow up call, no answer 

## 2021-10-12 NOTE — Telephone Encounter (Signed)
Returned patient call.  Patient c/o spasms intermittently throughout the night and some during the day.  Patient states "she wants to give it a couple of days to go away".  Encouraged her to call the office if symptoms worsen.

## 2021-10-12 NOTE — Telephone Encounter (Signed)
Patient returned call states the left of body is feeling like spasm and the right side is sore

## 2021-10-12 NOTE — Telephone Encounter (Signed)
Second post procedure follow up call, no answer 

## 2021-10-18 ENCOUNTER — Encounter: Payer: Self-pay | Admitting: Gastroenterology

## 2021-11-05 ENCOUNTER — Telehealth: Payer: Self-pay | Admitting: Physician Assistant

## 2021-11-05 NOTE — Telephone Encounter (Signed)
Patient called states she received the letter with results and she is wanting to proceed with the referral for nerve block.

## 2021-11-06 NOTE — Telephone Encounter (Signed)
Called [facility] to inquire further about Dr. Tarri Glenn request to ensure this is something they would be able to see pt for and to treat. LVM with clinical dept. Will await returned call.

## 2021-11-07 ENCOUNTER — Other Ambulatory Visit: Payer: Self-pay

## 2021-11-07 DIAGNOSIS — R1084 Generalized abdominal pain: Secondary | ICD-10-CM

## 2021-11-07 NOTE — Telephone Encounter (Signed)
SECOND ATTEMPT:  LVM with clinical nursing staff at [facility] requesting returned call.

## 2021-11-07 NOTE — Telephone Encounter (Signed)
Received call from [place]. Inquired further to determine if this referral request is something their providers could do. States she will discuss with providers and return my call. Further adds, if this is something they are unable to do, then she will inquire whom they would recommend for this particular type of injection. Provided Yolanda with my direct extension. States she will call me back once she has an answer. Will await her call.

## 2021-11-07 NOTE — Telephone Encounter (Signed)
Referral, office notes, demographics, imaging reports, procedure report and pathology faxed to [facility], Dr. Posey Pronto # [phone]. Confirmation received.

## 2021-11-07 NOTE — Telephone Encounter (Signed)
Called Dr. Serita Grit office at [phone]. LVM with staff requesting returned call.

## 2021-11-07 NOTE — Telephone Encounter (Signed)
Received returned call from [facility] @ Dr. Serita Grit office. Informed her about Dr. Tarri Glenn request below. Placed me on hold to inquire further to determine if this is a service their office could provide. [place] returned to the line and states the provider confirmed this is a service they provide. Asked that the referral be sent to [phone].

## 2021-11-07 NOTE — Telephone Encounter (Signed)
Inbound call from [facility] with [facility] returning your call.  Can be reached directly to her at [phone]

## 2021-11-07 NOTE — Telephone Encounter (Signed)
Received call from [place]. States their providers are NOT able to do this type of pain management and advised I would need to call [facility], specifically Dr. Fredric Mare to inquire further.

## 2021-11-08 ENCOUNTER — Telehealth: Payer: Self-pay | Admitting: Cardiology

## 2021-11-08 NOTE — Telephone Encounter (Addendum)
Patient notified and verbalized understanding.  States she will hold off on changing medications for now.  Will see how she feels & if symptoms worsen, she will call EMS.  Also, reminded her of protocol for Nitroglycerin usage.

## 2021-11-08 NOTE — Telephone Encounter (Addendum)
Chest pain off/on x last week.  Some SOB which she says is new.  No c/o dizziness.  Does note some tiredness and fatigue.  Does feel more pressure in chest with activity.  No active chest pain currently.  States that she has been very stressed for the last week.  She does also c/o jaw pain on left side down into throat area x 5 days off/on.  158/86 BP done today at [facility].  Does not have anyway to go to the Emergency Room to be evaluated.  Has not taken any of her Nitroglycerin yet.  Rating pain at 5/10 during episodes.

## 2021-11-08 NOTE — Telephone Encounter (Signed)
Pt c/o of Chest Pain: STAT if CP now or developed within 24 hours  1. Are you having CP right now? No   2. Are you experiencing any other symptoms (ex. SOB, nausea, vomiting, sweating)? No   3. How long have you been experiencing CP? For the past week  4. Is your CP continuous or coming and going? Coming and going   5. Have you taken Nitroglycerin? No   ? Reports it is pain on her left side under breast is requesting a MRI

## 2021-11-08 NOTE — Telephone Encounter (Signed)
Mailbox is full.

## 2021-11-14 NOTE — Telephone Encounter (Signed)
Called to f/u on status of referral. States pt is scheduled to be seen by Dr. Burton Apley on 12/17/21 @ 1pm.

## 2021-11-30 ENCOUNTER — Telehealth: Payer: Self-pay | Admitting: Gastroenterology

## 2021-11-30 NOTE — Telephone Encounter (Signed)
Called pt to advise that we have previously spoken to Dr. Trena Platt office to determine if they could provide the following required service to treat abd pain:  Abdominal cutaneous nerve block - abdominal wall injection of local anesthetic and corticosteroid to relieve abd pain  Advised this is not a service that can be provided by Dr. Manuella Ghazi. Advised this is why she was scheduled with an alternate provider. Pt advised Dr. Trena Platt office called to advise she is not able to see a different provider as this would be a violation of her pain contract. Reminded pt that we spoke directly with Dr. Trena Platt office. Given the required service and their inability to perform service, this would be a conversation she would need to have directly with Dr. Manuella Ghazi. Verbalized acceptance and understanding.

## 2021-11-30 NOTE — Telephone Encounter (Signed)
Patient called and stated that she had been referred to a pain management spine specialist from Dr. Tarri Glenn. Patient stated that she already had a Dr that does that for her and his name is Dr. Sherlyn Lees  MD. Wanting to see if she can just continue care with him.    [address]  [place] [address]  [phone]

## 2021-12-06 ENCOUNTER — Telehealth: Payer: Self-pay | Admitting: Cardiology

## 2021-12-06 NOTE — Telephone Encounter (Signed)
Patient c/o tooth ache pain in left side of chest.  Recently under a lot of stress & c/o of multitude of problems.  See care everywhere for most recent ED visit.  BP also staying elevated - 160 - 180's / 90-100's. Last carotid done 08/2021 & CT Lung done 10/2020.

## 2021-12-06 NOTE — Telephone Encounter (Signed)
New Message:     Patient says she would like for Dr Domenic Polite to order an another Carotid Ultrasound and a CT of her lungs and chest.

## 2021-12-07 NOTE — Telephone Encounter (Signed)
Patient notified via detailed voice message.

## 2021-12-25 ENCOUNTER — Telehealth: Payer: Self-pay | Admitting: Cardiology

## 2021-12-25 NOTE — Telephone Encounter (Addendum)
Advised we did not refer  her to [facility]. Advised to call [facility] and request a different pulmonologist Verbalized understanding

## 2021-12-25 NOTE — Telephone Encounter (Signed)
New Message:   Patient says she needs Dr Victoria Townsend to refer her to a good Pulmonary Doctor.She no longer wants to see Dr Melvyn Novas.

## 2021-12-27 NOTE — Telephone Encounter (Signed)
After review of chart, pulmonology referral by our office not found. Appears that PCP Huel Cote has made referral to pulmonology. Will forward to [facility] patient's request for pulmonology referral.

## 2021-12-27 NOTE — Telephone Encounter (Signed)
° °  Pt is calling back, she said, she spoke with Silva Bandy she said that a supervisor at her pcp office, she was told to call back Dr. Domenic Polite and request to send a referral for her for pulmonologist, she said again, she doesn't want to see Dr. Melvyn Novas

## 2021-12-27 NOTE — Telephone Encounter (Signed)
Spoke with Mel Almond at [facility] and request a provider switch. Per Mel Almond, she would co-ordinate this with Dr. Melvyn Novas and then contact the patient with an appointment.

## 2021-12-31 ENCOUNTER — Telehealth: Payer: Self-pay | Admitting: Internal Medicine

## 2021-12-31 ENCOUNTER — Telehealth (INDEPENDENT_AMBULATORY_CARE_PROVIDER_SITE_OTHER): Payer: Medicaid Other | Admitting: Nurse Practitioner

## 2021-12-31 ENCOUNTER — Other Ambulatory Visit: Payer: Self-pay

## 2021-12-31 DIAGNOSIS — J449 Chronic obstructive pulmonary disease, unspecified: Secondary | ICD-10-CM

## 2021-12-31 NOTE — Telephone Encounter (Signed)
Fine with me

## 2021-12-31 NOTE — Telephone Encounter (Signed)
I am okay with switching. 

## 2021-12-31 NOTE — Telephone Encounter (Signed)
She wants to switch to another provider in [place]. Please advise if ok to change. Thanks

## 2021-12-31 NOTE — Telephone Encounter (Signed)
Called and spoke with patient. She is aware that both providers have signed off on her switching providers. She was scheduled for the soonest appt with Dr. Halford Chessman in [place]. I advised her to periodically call the office to see if anyone had cancelled any appt. She verbalized understanding. She is also aware that she will be seeing Dr. Halford Chessman in [place] and not [place].   Nothing further needed at time of call.

## 2022-01-01 ENCOUNTER — Other Ambulatory Visit (HOSPITAL_COMMUNITY): Payer: Self-pay

## 2022-01-01 DIAGNOSIS — Z122 Encounter for screening for malignant neoplasm of respiratory organs: Secondary | ICD-10-CM

## 2022-01-01 DIAGNOSIS — Z87891 Personal history of nicotine dependence: Secondary | ICD-10-CM

## 2022-01-01 NOTE — Progress Notes (Signed)
Order placed for LDCT per protocol. LDCT scheduled for 02/08/2022 at 1145.

## 2022-01-15 NOTE — Progress Notes (Signed)
Pt no show - canceled visit

## 2022-01-17 ENCOUNTER — Ambulatory Visit: Payer: Medicaid Other | Admitting: Urology

## 2022-01-25 ENCOUNTER — Ambulatory Visit: Payer: Medicaid Other | Admitting: Urology

## 2022-02-08 ENCOUNTER — Other Ambulatory Visit: Payer: Self-pay

## 2022-02-08 ENCOUNTER — Encounter (HOSPITAL_COMMUNITY): Payer: Self-pay

## 2022-02-08 ENCOUNTER — Ambulatory Visit (HOSPITAL_COMMUNITY)
Admission: RE | Admit: 2022-02-08 | Discharge: 2022-02-08 | Disposition: A | Discharge status: Home / Self Care | Payer: Medicaid Other | Source: Ambulatory Visit | Attending: Physician Assistant | Admitting: Physician Assistant

## 2022-02-08 DIAGNOSIS — Z122 Encounter for screening for malignant neoplasm of respiratory organs: Secondary | ICD-10-CM | POA: Diagnosis present

## 2022-02-08 DIAGNOSIS — Z87891 Personal history of nicotine dependence: Secondary | ICD-10-CM | POA: Diagnosis present

## 2022-02-19 ENCOUNTER — Ambulatory Visit: Payer: Medicaid Other | Admitting: Urology

## 2022-02-19 NOTE — Progress Notes (Deleted)
? ?Assessment: ?1. Nephrolithiasis   ? ? ? ?Plan: ?*** ? ?Chief Complaint: No chief complaint on file. ? ? ?History of Present Illness: ? ?Victoria Townsend is a 61 y.o. year old female who is seen in consultation from Leeanne Rio, MD for evaluation of ***. ? ? ?Past Medical History:  ?Past Medical History:  ?Diagnosis Date  ? Agoraphobia   ? Anxiety disorder   ? Arthritis   ? Asthma   ? Bladder polyps   ? CAD (coronary artery disease)   ? Cardiac CTA June 2020  ? Colon polyps   ? COPD (chronic obstructive pulmonary disease) ([place])   ? Depression   ? Emphysema lung ([place])   ? Essential hypertension   ? Fibromyalgia   ? Gallstones   ? GERD (gastroesophageal reflux disease)   ? Herpes   ? Irregular heartbeat   ? Nephrolithiasis   ? Polysubstance abuse ([place])   ? History of cocaine, alcohol, marijuana in the past  ? PTSD (post-traumatic stress disorder)   ? Shoulder pain   ? Shoulder has been injected  ? ? ?Past Surgical History:  ?Past Surgical History:  ?Procedure Laterality Date  ? APPENDECTOMY    ? Bladder polyps    ? CESAREAN SECTION WITH BILATERAL TUBAL LIGATION    ? CHOLECYSTECTOMY    ? COLONOSCOPY  May 2012  ? Dr. Posey Pronto: scattered diverticula pancolonic  ? COLONOSCOPY WITH PROPOFOL N/A 01/29/2018  ? Procedure: COLONOSCOPY WITH PROPOFOL;  Surgeon: Daneil Dolin, MD;  [facility];  Service: Endoscopy;  Laterality: N/A;  ? ESOPHAGOGASTRODUODENOSCOPY  April 2012  ? Dr. Posey Pronto: gastritis  ? ESOPHAGOGASTRODUODENOSCOPY N/A 08/09/2013  ? UKG:[id number] polyps/abnormal gastric because of uncertain significance-status post biopsy. Small hiatal PrismBlog.es chronic inflammation on bx. hyperplastic polyp  ? ESOPHAGOGASTRODUODENOSCOPY (EGD) WITH PROPOFOL N/A 01/29/2018  ? Procedure: ESOPHAGOGASTRODUODENOSCOPY (EGD) WITH PROPOFOL;  Surgeon: Daneil Dolin, MD;  [facility];  Service: Endoscopy;  Laterality: N/A;  1:30  ? MALONEY DILATION N/A 01/29/2018  ? Procedure: MALONEY DILATION;  Surgeon: Daneil Dolin, MD;  [facility];  Service: Endoscopy;  Laterality: N/A;  ? URETHRAL STRICTURE DILATATION    ? WRIST FRACTURE SURGERY Right   ? ? ?Allergies:  ?Allergies  ?Allergen Reactions  ? Ciprofloxacin Swelling and Other (See Comments)  ?  Facial swelling  ? Codeine Nausea And Vomiting  ? Ibuprofen Other (See Comments)  ?  Oral cavity burning sensations  ? Symbicort [Budesonide-Formoterol Fumarate] Other (See Comments)  ?  "Took breath away"  ? Erythromycin Other (See Comments)  ?  Facial swelling  ? Imdur [Isosorbide Nitrate] Other (See Comments)  ?  Headaches  ? Mucinex [Guaifenesin Er] Nausea And Vomiting  ? Nsaids Other (See Comments)  ?  She reports she does not take.   ? Roxicodone [Oxycodone] Swelling  ?  Mouth swelling  ? Shellfish Allergy Rash  ? ? ?Family History:  ?Family History  ?Problem Relation Age of Onset  ? Coronary artery disease Mother   ?     CABG  ? Diabetes Mother   ? Heart attack Mother   ? Liver cancer Sister   ? Alcohol abuse Sister   ? Drug abuse Sister   ? Clotting disorder Sister   ? Alcohol abuse Brother   ? Drug abuse Brother   ? Diabetes Brother   ? Diabetes Brother   ? Heart disease Brother   ? Obesity Brother   ? Alcohol  abuse Brother   ? Drug abuse Brother   ? Stomach cancer Paternal Grandmother 2  ? Stomach cancer Maternal Aunt 60  ? Ovarian cancer Maternal Aunt   ? Breast cancer Paternal Aunt   ? Colon cancer Paternal Uncle 54  ? Fibromyalgia Daughter   ? Thyroid disease Daughter   ? Anxiety disorder Daughter   ? ? ?Social History:  ?Social History  ? ?Tobacco Use  ? Smoking status: Every Day  ?  Packs/day: 0.50  ?  Years: 45.00  ?  Pack years: 22.50  ?  Types: Cigarettes  ?  Start date: 06/10/1974  ? Smokeless tobacco: Never  ?Vaping Use  ? Vaping Use: Never used  ?Substance Use Topics  ? Alcohol use: No  ?  Alcohol/week: 0.0 standard drinks  ?  Comment: Former user, heavy etoh use prior to 1990s.  ? Drug use: Not Currently  ?  Types: Marijuana, Cocaine  ?  Comment:  04/13/19-no cocaine; denies marijuana  ? ? ?Review of symptoms:  ?Constitutional:  Negative for unexplained weight loss, night sweats, fever, chills ?ENT:  Negative for nose bleeds, sinus pain, painful swallowing ?CV:  Negative for chest pain, shortness of breath, exercise intolerance, palpitations, loss of consciousness ?Resp:  Negative for cough, wheezing, shortness of breath ?GI:  Negative for nausea, vomiting, diarrhea, bloody stools ?GU:  Positives noted in HPI; otherwise negative for gross hematuria, dysuria, urinary incontinence ?Neuro:  Negative for seizures, poor balance, limb weakness, slurred speech ?Psych:  Negative for lack of energy, depression, anxiety ?Endocrine:  Negative for polydipsia, polyuria, symptoms of hypoglycemia (dizziness, hunger, sweating) ?Hematologic:  Negative for anemia, purpura, petechia, prolonged or excessive bleeding, use of anticoagulants  ?Allergic:  Negative for difficulty breathing or choking as a result of exposure to anything; no shellfish allergy; no allergic response (rash/itch) to materials, foods ? ?Physical exam: ?There were no vitals taken for this visit. ?GENERAL APPEARANCE:  Well appearing, well developed, well nourished, NAD ?HEENT: Atraumatic, Normocephalic, oropharynx clear. ?NECK: Supple without lymphadenopathy or thyromegaly. ?LUNGS: Clear to auscultation bilaterally. ?HEART: Regular Rate and Rhythm without murmurs, gallops, or rubs. ?ABDOMEN: Soft, non-tender, No Masses. ?EXTREMITIES: Moves all extremities well.  Without clubbing, cyanosis, or edema. ?NEUROLOGIC:  Alert and oriented x 3, normal gait, CN II-XII grossly intact.  ?MENTAL STATUS:  Appropriate. ?BACK:  Non-tender to palpation.  No CVAT ?SKIN:  Warm, dry and intact.   ? ?Results: ?No results found for this or any previous visit (from the past 24 hour(s)). ? ?

## 2022-02-20 ENCOUNTER — Ambulatory Visit: Payer: Medicaid Other | Admitting: Cardiology

## 2022-02-20 NOTE — Progress Notes (Deleted)
? ? ?Cardiology Office Note ? ?Date: 02/20/2022  ? ?ID: Victoria Townsend, DOB 1961-09-26, MRN [id number] ? ?PCP:  Victoria Rio, MD  ?Cardiologist:  Rozann Lesches, MD ?Electrophysiologist:  None  ? ?No chief complaint on file. ? ? ?History of Present Illness: ?Victoria Townsend is a 61 y.o. female last seen in September 2022. ? ?She is overdue for follow-up lipids. ? ?Past Medical History:  ?Diagnosis Date  ? Agoraphobia   ? Anxiety disorder   ? Arthritis   ? Asthma   ? Bladder polyps   ? CAD (coronary artery disease)   ? Cardiac CTA June 2020  ? Colon polyps   ? COPD (chronic obstructive pulmonary disease) ([place])   ? Depression   ? Emphysema lung ([place])   ? Essential hypertension   ? Fibromyalgia   ? Gallstones   ? GERD (gastroesophageal reflux disease)   ? Herpes   ? Irregular heartbeat   ? Nephrolithiasis   ? Polysubstance abuse ([place])   ? History of cocaine, alcohol, marijuana in the past  ? PTSD (post-traumatic stress disorder)   ? Shoulder pain   ? Shoulder has been injected  ? ? ?Past Surgical History:  ?Procedure Laterality Date  ? APPENDECTOMY    ? Bladder polyps    ? CESAREAN SECTION WITH BILATERAL TUBAL LIGATION    ? CHOLECYSTECTOMY    ? COLONOSCOPY  May 2012  ? Dr. Posey Pronto: scattered diverticula pancolonic  ? COLONOSCOPY WITH PROPOFOL N/A 01/29/2018  ? Procedure: COLONOSCOPY WITH PROPOFOL;  Surgeon: Daneil Dolin, MD;  [facility];  Service: Endoscopy;  Laterality: N/A;  ? ESOPHAGOGASTRODUODENOSCOPY  April 2012  ? Dr. Posey Pronto: gastritis  ? ESOPHAGOGASTRODUODENOSCOPY N/A 08/09/2013  ? ZSW:[id number] polyps/abnormal gastric because of uncertain significance-status post biopsy. Small hiatal PrismBlog.es chronic inflammation on bx. hyperplastic polyp  ? ESOPHAGOGASTRODUODENOSCOPY (EGD) WITH PROPOFOL N/A 01/29/2018  ? Procedure: ESOPHAGOGASTRODUODENOSCOPY (EGD) WITH PROPOFOL;  Surgeon: Daneil Dolin, MD;  [facility];  Service: Endoscopy;  Laterality: N/A;  1:30  ? MALONEY DILATION N/A 01/29/2018  ?  Procedure: MALONEY DILATION;  Surgeon: Daneil Dolin, MD;  [facility];  Service: Endoscopy;  Laterality: N/A;  ? URETHRAL STRICTURE DILATATION    ? WRIST FRACTURE SURGERY Right   ? ? ?Current Outpatient Medications  ?Medication Sig Dispense Refill  ? acetaminophen (TYLENOL) 500 MG tablet Take 1,000 mg by mouth every 8 (eight) hours as needed for moderate pain.    ? albuterol (VENTOLIN HFA) 108 (90 Base) MCG/ACT inhaler Inhale 2 puffs into the lungs every 6 (six) hours as needed for shortness of breath.    ? ALPRAZolam (XANAX) 1 MG tablet Take 1 mg by mouth 4 (four) times daily.    ? aspirin 81 MG EC tablet Take 81 mg by mouth daily as needed (for chest pain).    ? atorvastatin (LIPITOR) 10 MG tablet Take 1 tablet by mouth at bedtime.    ? cholecalciferol (VITAMIN D3) 25 MCG (1000 UNIT) tablet Take 1,000 Units by mouth daily.    ? gabapentin (NEURONTIN) 400 MG capsule Take 400 mg by mouth 3 (three) times daily.    ? hydrocortisone 2.5 % ointment APPLY AS DIRECTED DAILY AS NEEDED FOR ITCHING. 28.35 g 2  ? lidocaine (LIDODERM) 5 % Place 1 patch onto the skin every 12 (twelve) hours.    ? Multiple Vitamin (MULTI-DAY PO) Take by mouth.    ? nitroGLYCERIN (NITROSTAT) 0.4 MG SL tablet DISSOLVE 1  TABLET UNDER THE TONGUE EVERY 5 MINUTES AS NEEDED FOR CHEST PAIN. DO NOT EXCEED A TOTAL OF 3 DOSES IN 15 MINUTES. 25 tablet 1  ? oxybutynin (DITROPAN) 5 MG tablet Take 2.5 mg by mouth as needed.   3  ? oxyCODONE-acetaminophen (PERCOCET/ROXICET) 5-325 MG tablet Take 1 tablet by mouth 3 (three) times daily as needed.    ? pantoprazole (PROTONIX) 20 MG tablet Take 20 mg by mouth daily.    ? RESTASIS 0.05 % ophthalmic emulsion Place 1 drop into both eyes 2 (two) times daily.    ? valACYclovir (VALTREX) 500 MG tablet Take 1 tablet by mouth daily.    ? verapamil (CALAN-SR) 240 MG CR tablet Take 240 mg by mouth daily.    ? vitamin C (ASCORBIC ACID) 500 MG tablet Take 500 mg by mouth daily.    ? ?No current  facility-administered medications for this visit.  ? ?Allergies:  Ciprofloxacin, Codeine, Ibuprofen, Symbicort [budesonide-formoterol fumarate], Erythromycin, Imdur [isosorbide nitrate], Mucinex [guaifenesin er], Nsaids, Roxicodone [oxycodone], and Shellfish allergy  ? ?Social History: The patient  reports that she has been smoking cigarettes. She started smoking about 47 years ago. She has a 22.50 pack-year smoking history. She has never used smokeless tobacco. She reports that she does not currently use drugs after having used the following drugs: Marijuana and Cocaine. She reports that she does not drink alcohol.  ? ?Family History: The patient's family history includes Alcohol abuse in her brother, brother, and sister; Anxiety disorder in her daughter; Breast cancer in her paternal aunt; Clotting disorder in her sister; Colon cancer (age of onset: 36) in her paternal uncle; Coronary artery disease in her mother; Diabetes in her brother, brother, and mother; Drug abuse in her brother, brother, and sister; Fibromyalgia in her daughter; Heart attack in her mother; Heart disease in her brother; Liver cancer in her sister; Obesity in her brother; Ovarian cancer in her maternal aunt; Stomach cancer (age of onset: 68) in her maternal aunt; Stomach cancer (age of onset: 11) in her paternal grandmother; Thyroid disease in her daughter.  ? ?ROS:  Please see the history of present illness. Otherwise, complete review of systems is positive for {NONE DEFAULTED:18576}.  All other systems are reviewed and negative.  ? ?Physical Exam: ?VS:  There were no vitals taken for this visit., BMI There is no height or weight on file to calculate BMI. ? ?Wt Readings from Last 3 Encounters:  ?02/08/22 133 lb (60.3 kg)  ?10/10/21 137 lb (62.1 kg)  ?08/28/21 137 lb 6 oz (62.3 kg)  ?  ?General: Patient appears comfortable at rest. ?HEENT: Conjunctiva and lids normal, oropharynx clear with moist mucosa. ?Neck: Supple, no elevated JVP or  carotid bruits, no thyromegaly. ?Lungs: Clear to auscultation, nonlabored breathing at rest. ?Cardiac: Regular rate and rhythm, no S3 or significant systolic murmur, no pericardial rub. ?Abdomen: Soft, nontender, no hepatomegaly, bowel sounds present, no guarding or rebound. ?Extremities: No pitting edema, distal pulses 2+. ?Skin: Warm and dry. ?Musculoskeletal: No kyphosis. ?Neuropsychiatric: Alert and oriented x3, affect grossly appropriate. ? ?ECG:  An ECG dated 08/03/2021 was personally reviewed today and demonstrated:  Sinus bradycardia, no significant ST-T changes. ? ?Recent Labwork: ? ?October 2022: Potassium 4.4, BUN 10, creatinine 0.71, hemoglobin 13.5, platelets 297 ? ?Other Studies Reviewed Today: ? ?Carotid Dopplers 08/30/2021: ?IMPRESSION: ?Minimal amount of bilateral atherosclerotic plaque, right greater ?than left, not resulting in a hemodynamically significant stenosis ?within either internal carotid artery. ? ?Assessment and Plan: ? ? ? ?Medication  Adjustments/Labs and Tests Ordered: ?Current medicines are reviewed at length with the patient today.  Concerns regarding medicines are outlined above.  ? ?Tests Ordered: ?No orders of the defined types were placed in this encounter. ? ? ?Medication Changes: ?No orders of the defined types were placed in this encounter. ? ? ?Disposition:  Follow up {follow up:15908} ? ?Signed, ?Satira Sark, MD, [facility] ?02/20/2022 10:15 AM    ?[facility] ?[address], [place], [place] [address] ?Phone: [phone]; Fax: [phone]  ?

## 2022-03-18 ENCOUNTER — Telehealth: Payer: Self-pay | Admitting: Pharmacy Technician

## 2022-03-18 ENCOUNTER — Ambulatory Visit: Payer: Medicaid Other | Admitting: Pulmonary Disease

## 2022-03-18 ENCOUNTER — Other Ambulatory Visit (HOSPITAL_COMMUNITY): Payer: Self-pay

## 2022-03-18 ENCOUNTER — Encounter: Payer: Self-pay | Admitting: Pulmonary Disease

## 2022-03-18 VITALS — BP 130/88 | HR 64 | Temp 98.4°F | Ht 59.0 in | Wt 133.0 lb

## 2022-03-18 DIAGNOSIS — J432 Centrilobular emphysema: Secondary | ICD-10-CM | POA: Diagnosis not present

## 2022-03-18 MED ORDER — FLUTICASONE FUROATE-VILANTEROL 100-25 MCG/ACT IN AEPB: 1.0000 | INHALATION_SPRAY | Freq: Every day | RESPIRATORY_TRACT | 5 refills | Status: DC

## 2022-03-18 MED ORDER — FLUTICASONE FUROATE-VILANTEROL 100-25 MCG/ACT IN AEPB: 1.0000 | INHALATION_SPRAY | Freq: Every day | RESPIRATORY_TRACT | 0 refills | Status: DC

## 2022-03-18 NOTE — Progress Notes (Signed)
o ? ?[facility] ? ?Chief Complaint  ?Patient presents with  ? Follow-up  ?  States lungs are burning. Switching from Dr. Melvyn Novas  ?Would like Dr. Halford Chessman to look at CT done on 02/07/22   ? ? ?Past Surgical History:  ?She  has a past surgical history that includes Cesarean section with bilateral tubal ligation; Bladder polyps; Urethra dilation; Colonoscopy (May 2012); Esophagogastroduodenoscopy (April 2012); Esophagogastroduodenoscopy (N/A, 08/09/2013); Cholecystectomy; Appendectomy; Wrist fracture surgery (Right); Esophagogastroduodenoscopy (egd) with propofol (N/A, 01/29/2018); Colonoscopy with propofol (N/A, 01/29/2018); and maloney dilation (N/A, 01/29/2018). ? ?Past Medical History:  ?Agoraphobia, Anxiety, CAD, Colon polyps, Depression, HTN, Fibromyalgia, GERD, Nephrolithiasis, Cocaine abuse, Alcohol abuse, Marijuana abuse, PTSD ? ?Constitutional:  ?BP 130/88 (BP Location: Left Arm, Patient Position: Sitting)   Pulse 64   Temp 98.4 ?F (36.9 ?C) (Temporal)   Ht '4\' 11"'$  (1.499 m)   Wt 133 lb (60.3 kg)   SpO2 97% Comment: ra  BMI 26.86 kg/m?  ? ?Brief Summary:  ?Victoria Townsend is a 61 y.o. female smoker with chronic bronchitis and emphysema.   ?  ? ? ? ?Subjective:  ? ?Previously seen by Dr. Melvyn Novas. ? ?She feels like she has a burning in her airways and pins in her back when she breaths.  She is worried about exposures from her home.  She says there is mold, snakes, spiders, and other vermin.  She tried symbicort before and this "near about killed her".  She has been using albuterol hfa and this helps, but doesn't last.  Albuterol nebulizer caused burning in her lungs.  She is very worried about side effects from inhalers.  She still smoke cigarettes however.  She says that nicotine replacement causes her blood pressure to go high.  ? ?Physical Exam:  ? ?Appearance - well kempt  ? ?ENMT - no sinus tenderness, no oral exudate, no LAN, Mallampati 3 airway, no stridor,  overbite ? ?Respiratory - equal breath sounds bilaterally, no wheezing or rales ? ?CV - s1s2 regular rate and rhythm, no murmurs ? ?Ext - no clubbing, no edema ? ?Skin - no rashes ? ?Psych - normal mood and affect ?  ?Pulmonary testing:  ?PFT 06/20/20 >> FEV1 2.45 (116%), FEV1% 85, TLC 5.01 (116%), DLCO 112% ? ?Chest Imaging:  ?LDCT chest 02/12/22 >> small pleural based nodules LUL up to 5 mm, bronchial wall thickening with mild centrilobular and paraseptal emphysema, atherosclerosis ? ?Sleep Tests:  ? ? ?Cardiac Tests:  ? ? ?Social History:  ?She  reports that she has been smoking cigarettes. She started smoking about 47 years ago. She has a 22.50 pack-year smoking history. She has never used smokeless tobacco. She reports that she does not currently use drugs after having used the following drugs: Marijuana and Cocaine. She reports that she does not drink alcohol. ? ?Family History:  ?Her family history includes Alcohol abuse in her brother, brother, and sister; Anxiety disorder in her daughter; Breast cancer in her paternal aunt; Clotting disorder in her sister; Colon cancer (age of onset: 4) in her paternal uncle; Coronary artery disease in her mother; Diabetes in her brother, brother, and mother; Drug abuse in her brother, brother, and sister; Fibromyalgia in her daughter; Heart attack in her mother; Heart disease in her brother; Liver cancer in her sister; Obesity in her brother; Ovarian cancer in her maternal aunt; Stomach cancer (age of onset: 37) in her maternal aunt; Stomach cancer (age of onset: 42) in her paternal grandmother; Thyroid  disease in her daughter. ?  ? ? ?Assessment/Plan:  ? ?Chronic bronchitis with centrilobular emphysema. ?- she is intolerant of symbicort ?- will have her try breo; sample given and demonstrated technique ?- she will read through side effects in more detail and then determine if she feels comfortable trying breo ?- continue prn albuterol hfa ?- will arrange for overnight  oximetry on room air and then determine if she needs additional sleep assessment ?- discussed how environmental exposures can contribute to her respiratory symptoms also ? ?Tobacco abuse. ?- explained how continued cigarette smoking is contributing significantly to her ongoing respiratory issues ?- she reports elevated blood pressure from nicotine replacement therapy ?- she is concerned about side effects from medications used for smoking cessation ?- she will try quitting on her own  ?- she will need follow up low dose CT chest for lung cancer screening in March 2024 ? ?Coronary artery disease. ?- followed by Dr. Johnny Bridge with cardiology ? ?Time Spent Involved in Patient Care on Day of Examination:  ?51 minutes ? ?Follow up:  ? ?Patient Instructions  ?Breo one puff daily, and rinse your mouth after each use ? ?Will arrange for overnight oxygen test  ? ?Follow up in 3 months ? ?Medication List:  ? ?Allergies as of 03/18/2022   ? ?   Reactions  ? Ciprofloxacin Swelling, Other (See Comments)  ? Facial swelling  ? Codeine Nausea And Vomiting  ? Ibuprofen Other (See Comments)  ? Oral cavity burning sensations  ? Symbicort [budesonide-formoterol Fumarate] Other (See Comments)  ? "Took breath away"  ? Erythromycin Other (See Comments)  ? Facial swelling  ? Imdur [isosorbide Nitrate] Other (See Comments)  ? Headaches  ? Mucinex [guaifenesin Er] Nausea And Vomiting  ? Nsaids Other (See Comments)  ? She reports she does not take.   ? Roxicodone [oxycodone] Swelling  ? Mouth swelling  ? Shellfish Allergy Rash  ? ?  ? ?  ?Medication List  ?  ? ?  ? Accurate as of March 18, 2022 12:15 PM. If you have any questions, ask your nurse or doctor.  ?  ?  ? ?  ? ?STOP taking these medications   ? ?atorvastatin 10 MG tablet ?Commonly known as: LIPITOR ?Stopped by: Chesley Mires, MD ?  ? ?  ? ?TAKE these medications   ? ?acetaminophen 500 MG tablet ?Commonly known as: TYLENOL ?Take 1,000 mg by mouth every 8 (eight) hours as needed for  moderate pain. ?  ?albuterol 108 (90 Base) MCG/ACT inhaler ?Commonly known as: VENTOLIN HFA ?Inhale 2 puffs into the lungs every 6 (six) hours as needed for shortness of breath. ?  ?ALPRAZolam 1 MG tablet ?Commonly known as: Duanne Moron ?Take 1 mg by mouth 4 (four) times daily. ?  ?aspirin 81 MG EC tablet ?Take 81 mg by mouth daily as needed (for chest pain). ?  ?cholecalciferol 25 MCG (1000 UNIT) tablet ?Commonly known as: VITAMIN D3 ?Take 1,000 Units by mouth daily. ?  ?fluticasone furoate-vilanterol 100-25 MCG/ACT Aepb ?Commonly known as: Breo Ellipta ?Inhale 1 puff into the lungs daily. ?Started by: Chesley Mires, MD ?  ?gabapentin 400 MG capsule ?Commonly known as: NEURONTIN ?Take 400 mg by mouth 3 (three) times daily. ?  ?hydrocortisone 2.5 % ointment ?APPLY AS DIRECTED DAILY AS NEEDED FOR ITCHING. ?  ?lidocaine 5 % ?Commonly known as: LIDODERM ?Place 1 patch onto the skin every 12 (twelve) hours. ?  ?MULTI-DAY PO ?Take by mouth. ?  ?nitroGLYCERIN 0.4 MG  SL tablet ?Commonly known as: NITROSTAT ?DISSOLVE 1 TABLET UNDER THE TONGUE EVERY 5 MINUTES AS NEEDED FOR CHEST PAIN. DO NOT EXCEED A TOTAL OF 3 DOSES IN 15 MINUTES. ?  ?oxybutynin 5 MG tablet ?Commonly known as: DITROPAN ?Take 2.5 mg by mouth as needed. ?  ?oxyCODONE-acetaminophen 5-325 MG tablet ?Commonly known as: PERCOCET/ROXICET ?Take 1 tablet by mouth 3 (three) times daily as needed. ?  ?pantoprazole 20 MG tablet ?Commonly known as: PROTONIX ?Take 20 mg by mouth daily. ?  ?Restasis 0.05 % ophthalmic emulsion ?Generic drug: cycloSPORINE ?Place 1 drop into both eyes 2 (two) times daily. ?  ?valACYclovir 500 MG tablet ?Commonly known as: VALTREX ?Take 1 tablet by mouth daily. ?  ?verapamil 240 MG CR tablet ?Commonly known as: CALAN-SR ?Take 240 mg by mouth daily. ?  ?vitamin C 500 MG tablet ?Commonly known as: ASCORBIC ACID ?Take 500 mg by mouth daily. ?  ? ?  ? ? ?Signature:  ?Chesley Mires, MD ?[place] ?Pager - [phone] - [phone] ?03/18/2022,  12:15 PM ?  ? ? ? ? ? ? ? ? ?

## 2022-03-18 NOTE — Patient Instructions (Signed)
Breo one puff daily, and rinse your mouth after each use ? ?Will arrange for overnight oxygen test  ? ?Follow up in 3 months ?

## 2022-03-18 NOTE — Telephone Encounter (Signed)
Patient Advocate Encounter ? ?Received notification from [place] that prior authorization for BREO INH 100-25MCG is required. ?  ?PA submitted on 03/18/22 ?Key [id number] ?Status is pending ?  ?[facility] will continue to follow ? ?Victoria Townsend, CPhT ?Patient Advocate ?Phone: [phone] ?Fax:  [phone]  ? ?

## 2022-03-19 ENCOUNTER — Telehealth: Payer: Self-pay | Admitting: Gastroenterology

## 2022-03-19 NOTE — Telephone Encounter (Signed)
Received a fax regarding Prior Authorization from [place] for [place] 100. Authorization has been DENIED because PT HAS NOT TRIED AND FAILED TWO FORMULARY PREFERRED MEDICATIONS. ?FORMULARY MEDICATIONS ARE Advair Diskus, Advair HFA, Symbicort, and Dulera. ? ? ?

## 2022-03-19 NOTE — Telephone Encounter (Signed)
She tried symbicort before and didn't tolerate this. ? ?Please let her know her insurance won't cover breo unless she tries one more inhaler that her insurance has on their list. ? ?Please send script for advair diskus 100-50 one puff bid. ? ?

## 2022-03-19 NOTE — Telephone Encounter (Signed)
Returned pt call to inquire further about location of possible hernia. LVM requesting returned call. ?

## 2022-03-19 NOTE — Telephone Encounter (Signed)
Patient returned your call, please advise. 

## 2022-03-19 NOTE — Telephone Encounter (Signed)
Patient called seeking advise. Per patient, feels pain and soreness from hernia, ongoing for a while. Patient states she contacted her spine pain management team and they told her they don't deal with hernias. Patient would like to know if there's anything we could do to help her. Please advise. ?

## 2022-03-19 NOTE — Telephone Encounter (Signed)
Received a fax regarding Prior Authorization from [place] for [place] 100. Authorization has been DENIED because PT HAS NOT TRIED AND FAILED TWO FORMULARY PREFERRED MEDICATIONS. ?FORMULARY MEDICATIONS ARE Advair Diskus, Advair HFA, Symbicort, and Dulera. ?

## 2022-03-19 NOTE — Telephone Encounter (Signed)
ATC patient.  LMTCB. 

## 2022-03-20 NOTE — Telephone Encounter (Signed)
Pt returned call. States her previous EGD showed a hiatal hernia. She is now having epigastric pain and difficulties swallowing. Feels like food is getting stuck in her chest resulting in need to drink more fluids in order to get food to go down. Pt has been scheduled for f/u 04/11/22 @ 150pm. ?

## 2022-03-21 ENCOUNTER — Encounter: Payer: Self-pay | Admitting: Pulmonary Disease

## 2022-03-22 NOTE — Telephone Encounter (Signed)
ATC patient  

## 2022-03-25 ENCOUNTER — Telehealth: Payer: Self-pay | Admitting: Gastroenterology

## 2022-03-25 MED ORDER — FLUTICASONE-SALMETEROL 100-50 MCG/ACT IN AEPB: 1.0000 | INHALATION_SPRAY | Freq: Two times a day (BID) | RESPIRATORY_TRACT | 6 refills | Status: DC

## 2022-03-25 NOTE — Telephone Encounter (Signed)
Routing as FYI ? ?Called pt to inquire further about her symptoms. States she has been having flank pain and occassionally difficulty urinating. Denies fever. Advised she contact her PCP for him/her to further evaluate and treat. States she is also having epigastric pain and is wanting something to manage her pain. Pt has f/u appt scheduled 04/11/22. Reminded that the previous POT was a referral to a pain specialist for abd injections. However, review of her chart indicates she could not be seen by that provider because of her "pain contract" with her current pain specialist who does not perform abd injections. Pt asked if we would be willing to prescribe a medication to resolve her pain. Advised she is welcome to increase dosage of Pantoprazole to BID to see if this helps resolve or diminish her epigastric discomfort. Pt asked for something stronger. Advised I will route this message to Dr. Tarri Glenn to make her aware of request. However, reminded pt that Dr. Tarri Glenn does not generally prescribe narcotics for epigastric pain. Further advised she is welcome to readdress need for abd injections with her current pain specialist as it seems her current pain specialist may need to refer to an appropriate provider without violating her pain contract. Also advised to keep f/u appt as scheduled on 04/11/22. Verbalized acceptance and understanding. Routing this message to Dr. Tarri Glenn for continuity of care purposes. ?

## 2022-03-25 NOTE — Telephone Encounter (Signed)
ATC patient. Sent advair inhaler in to [facility] and sent letter letting patient know inhaler was changed due to insurance denial.  ?

## 2022-03-25 NOTE — Telephone Encounter (Signed)
Inbound call from patient reports she have been in severe pain in her side ?

## 2022-03-26 NOTE — Telephone Encounter (Signed)
Inbound call from patient stating she would like to speak with Dr. Tarri Glenn nurse again. Please advise.  ?

## 2022-03-26 NOTE — Telephone Encounter (Signed)
Returned pt call. Pt states she is in excruciating pain and needing care ASAP. Pt has been advised to proceed to ED for further evaluation and treatment. Pt states she does not want to go to [facility] hosp because they don't do anything. Advised if she feels she is not receiving adequate care, she is welcome to proceed to another ED for evaluation and treatment. Pt asked for pain medication to treat her pain. Advised Dr. Tarri Glenn will not Rx pain medication for her pain but has advised she continue to take Protonix BID and speak with her pain specialist re: abd injections as previously recommended. Reminded, no further advice can be provided without an appointment, although she is scheduled for f/u with Dr. Tarri Glenn 04/11/22. Advised she keep appt as scheduled.  ?

## 2022-04-02 ENCOUNTER — Telehealth: Payer: Self-pay | Admitting: Cardiology

## 2022-04-02 NOTE — Progress Notes (Signed)
? ?Cardiology Office Note   ? ?Date:  04/10/2022  ? ?ID:  Victoria Townsend, DOB 1961-05-12, MRN [id number] ? ? ?PCP:  Leeanne Rio, MD ?  ?[place]  ?Cardiologist:  Rozann Lesches, MD   ?Advanced Practice Provider:  No care team member to display ?Electrophysiologist:  None  ? ?[id number]}  ? ?Chief Complaint  ?Patient presents with  ? Chest Pain  ? ? ?History of Present Illness:  ?Victoria Townsend is a 61 y.o. female with history of chest pain and multivessel CAD by cardiac CTA 2020 recommend medical management, carotid artery disease, hyperlipidemia, hypertension. ? ?Patient last saw Dr. Domenic Polite 08/03/2021 at which time she was having intermittent angina and using nitroglycerin.  She did not tolerate Imdur due to headaches. could potentially add Ranexa if needed.  She will need to come off [name] if we  replaced with bisoprolol. ? ?Patient added onto my schedule because of elevated blood pressures. Also called in with chest pain unrelieved with NTG. ? ?Patient comes in complaining of facial numbness, rotator cuff problems, thinks she has black mold disease, blood disorder, muscle aches everywhere.  A week ago had left face numbness with tightness in her neck all the way down into her chest with a stinging burning sensation. EMS came out BP 164/103. She says the NTG helped some. No chest pain in the past week but facial numbness yest. Carotid dopplers 08/2021 was without stenosis. House has black mold. No chest pain with going up steep hill that she calls a mountain.. She doe get out of breath and stops and rests. Also has a HH with abdominal pain.  ? ? ? ?Past Medical History:  ?Diagnosis Date  ? Agoraphobia   ? Anxiety disorder   ? Arthritis   ? Asthma   ? Bladder polyps   ? CAD (coronary artery disease)   ? Cardiac CTA June 2020  ? Colon polyps   ? COPD (chronic obstructive pulmonary disease) ([place])   ? Depression   ? Emphysema lung ([place])   ? Essential hypertension   ? Fibromyalgia   ?  Gallstones   ? GERD (gastroesophageal reflux disease)   ? Herpes   ? Irregular heartbeat   ? Nephrolithiasis   ? Polysubstance abuse ([place])   ? History of cocaine, alcohol, marijuana in the past  ? PTSD (post-traumatic stress disorder)   ? Shoulder pain   ? Shoulder has been injected  ? ? ?Past Surgical History:  ?Procedure Laterality Date  ? APPENDECTOMY    ? Bladder polyps    ? CESAREAN SECTION WITH BILATERAL TUBAL LIGATION    ? CHOLECYSTECTOMY    ? COLONOSCOPY  May 2012  ? Dr. Posey Pronto: scattered diverticula pancolonic  ? COLONOSCOPY WITH PROPOFOL N/A 01/29/2018  ? Procedure: COLONOSCOPY WITH PROPOFOL;  Surgeon: Daneil Dolin, MD;  [facility];  Service: Endoscopy;  Laterality: N/A;  ? ESOPHAGOGASTRODUODENOSCOPY  April 2012  ? Dr. Posey Pronto: gastritis  ? ESOPHAGOGASTRODUODENOSCOPY N/A 08/09/2013  ? RAQ:TMAUQJF polyps/abnormal gastric because of uncertain significance-status post biopsy. Small hiatal PrismBlog.es chronic inflammation on bx. hyperplastic polyp  ? ESOPHAGOGASTRODUODENOSCOPY (EGD) WITH PROPOFOL N/A 01/29/2018  ? Procedure: ESOPHAGOGASTRODUODENOSCOPY (EGD) WITH PROPOFOL;  Surgeon: Daneil Dolin, MD;  [facility]: [facility];  Service: Endoscopy;  Laterality: N/A;  1:30  ? MALONEY DILATION N/A 01/29/2018  ? Procedure: MALONEY DILATION;  Surgeon: Daneil Dolin, MD;  Location: [facility];  Service: Endoscopy;  Laterality: N/A;  ?  URETHRAL STRICTURE DILATATION    ? WRIST FRACTURE SURGERY Right   ? ? ?Current Medications: ?Current Meds  ?Medication Sig  ? albuterol (VENTOLIN HFA) 108 (90 Base) MCG/ACT inhaler Inhale 2 puffs into the lungs every 6 (six) hours as needed for shortness of breath.  ? ALPRAZolam (XANAX) 1 MG tablet Take 1 mg by mouth 4 (four) times daily.  ? aspirin 81 MG EC tablet Take 81 mg by mouth daily as needed (for chest pain).  ? atorvastatin (LIPITOR) 10 MG tablet Take 1 tablet by mouth daily.  ? bisoprolol (ZEBETA) 10 MG tablet Take 1 tablet (10 mg total) by mouth daily.  ?  cholecalciferol (VITAMIN D3) 25 MCG (1000 UNIT) tablet Take 1,000 Units by mouth daily.  ? fluticasone-salmeterol (ADVAIR DISKUS) 100-50 MCG/ACT AEPB Inhale 1 puff into the lungs 2 (two) times daily.  ? gabapentin (NEURONTIN) 400 MG capsule Take 400 mg by mouth 3 (three) times daily.  ? hydrocortisone 2.5 % ointment APPLY AS DIRECTED DAILY AS NEEDED FOR ITCHING.  ? lidocaine (LIDODERM) 5 % Place 1 patch onto the skin every 12 (twelve) hours.  ? Multiple Vitamin (MULTI-DAY PO) Take by mouth.  ? nitroGLYCERIN (NITROSTAT) 0.4 MG SL tablet DISSOLVE 1 TABLET UNDER THE TONGUE EVERY 5 MINUTES AS NEEDED FOR CHEST PAIN. DO NOT EXCEED A TOTAL OF 3 DOSES IN 15 MINUTES.  ? oxybutynin (DITROPAN) 5 MG tablet Take 2.5 mg by mouth as needed.   ? oxyCODONE-acetaminophen (PERCOCET/ROXICET) 5-325 MG tablet Take 1 tablet by mouth 3 (three) times daily as needed.  ? pantoprazole (PROTONIX) 20 MG tablet Take 20 mg by mouth daily.  ? ranolazine (RANEXA) 500 MG 12 hr tablet Take 1 tablet (500 mg total) by mouth 2 (two) times daily.  ? RESTASIS 0.05 % ophthalmic emulsion Place 1 drop into both eyes 2 (two) times daily.  ? valACYclovir (VALTREX) 500 MG tablet Take 1 tablet by mouth daily.  ? vitamin C (ASCORBIC ACID) 500 MG tablet Take 500 mg by mouth daily.  ? [DISCONTINUED] verapamil (CALAN-SR) 240 MG CR tablet Take 240 mg by mouth daily.  ?  ? ?Allergies:   Ciprofloxacin, Codeine, Ibuprofen, Symbicort [budesonide-formoterol fumarate], Erythromycin, Imdur [isosorbide nitrate], Mucinex [guaifenesin er], Nsaids, Roxicodone [oxycodone], and Shellfish allergy  ? ?Social History  ? ?Socioeconomic History  ? Marital status: Single  ?  Spouse name: Not on file  ? Number of children: 1  ? Years of education: Not on file  ? Highest education level: Not on file  ?Occupational History  ? Occupation: disabled  ?Tobacco Use  ? Smoking status: Every Day  ?  Packs/day: 0.50  ?  Years: 45.00  ?  Pack years: 22.50  ?  Types: Cigarettes  ?  Start date:  06/10/1974  ? Smokeless tobacco: Never  ?Vaping Use  ? Vaping Use: Never used  ?Substance and Sexual Activity  ? Alcohol use: No  ?  Alcohol/week: 0.0 standard drinks  ?  Comment: Former user, heavy etoh use prior to 1990s.  ? Drug use: Not Currently  ?  Types: Marijuana, Cocaine  ?  Comment: 04/13/19-no cocaine; denies marijuana  ? Sexual activity: Not on file  ?Other Topics Concern  ? Not on file  ?Social History Narrative  ? ** Merged History Encounter **  ?    ? ?Social Determinants of Health  ? ?Financial Resource Strain: Not on file  ?Food Insecurity: Not on file  ?Transportation Needs: Not on file  ?Physical Activity: Not on file  ?  Stress: Not on file  ?Social Connections: Not on file  ?  ? ?Family History:  The patient's  family history includes Alcohol abuse in her brother, brother, and sister; Anxiety disorder in her daughter; Breast cancer in her paternal aunt; Clotting disorder in her sister; Colon cancer (age of onset: 61) in her paternal uncle; Coronary artery disease in her mother; Diabetes in her brother, brother, and mother; Drug abuse in her brother, brother, and sister; Fibromyalgia in her daughter; Heart attack in her mother; Heart disease in her brother; Liver cancer in her sister; Obesity in her brother; Ovarian cancer in her maternal aunt; Stomach cancer (age of onset: 40) in her maternal aunt; Stomach cancer (age of onset: 47) in her paternal grandmother; Thyroid disease in her daughter.  ? ?ROS:   ?Please see the history of present illness.    ?ROS All other systems reviewed and are negative. ? ? ?PHYSICAL EXAM:   ?VS:  BP 140/90   Pulse 67   Ht '4\' 10"'$  (1.473 m)   Wt 131 lb 3.2 oz (59.5 kg)   SpO2 96%   BMI 27.42 kg/m?   ?Physical Exam  ?GEN: Well nourished, well developed, in no acute distress  ?Neck: no JVD, carotid bruits, or masses ?Cardiac:RRR; no murmurs, rubs, or gallops  ?Respiratory:  clear to auscultation bilaterally, normal work of breathing ?GI: soft, nontender, nondistended,  + BS ?Ext: without cyanosis, clubbing, or edema, Good distal pulses bilaterally ?Neuro:  Alert and Oriented x 3, Strength and sensation are intact ?Psych: euthymic mood, full affect ? ?Wt Readings from Last 3 Encounte

## 2022-04-02 NOTE — Telephone Encounter (Signed)
Pt c/o BP issue: STAT if pt c/o blurred vision, one-sided weakness or slurred speech ? ?1. What are your last 5 BP readings? 164/103 - yesterday ? ?2. Are you having any other symptoms (ex. Dizziness, headache, blurred vision, passed out)? Weak, fatigue left side of face went numb - yesterday ? ?3. What is your BP issue? Pt states that BP was really high yesterday to the point she had to call EMS. Pt would like an appt with Dr. Domenic Polite sooner than what I offered. Please advise ? ?

## 2022-04-03 ENCOUNTER — Telehealth: Payer: Self-pay | Admitting: Pulmonary Disease

## 2022-04-03 NOTE — Telephone Encounter (Signed)
Called and spoke to patient.  ?She voiced understanding of results.  ?She wants to wait on HST for now since she has a hernia and some issues with cardiology.   ?Explained to patient that untreated OSA can have some cardiac effects and she states she will talk to cardiology regarding OSA and cardiac effects.  ?

## 2022-04-03 NOTE — Telephone Encounter (Signed)
Reports numbness on left side of face, neck & chest pain rated 10/10 2 days ago. Took nitro x's 1 and symptoms didn't improve until 2 days later.  ?Denies active numbness, chest pain, dizziness or sob.  ?Advised that she needed to contact PCP regarding numbness ?Explained correct instructions for nitroglycerin. (Dissolve one under tongue for chest pain every 5 minutes up to 3 doses. If no relief, proceed to ED.) ?Offered appointment today but due to transportation, was unable to accept ?Advised to keep appointment on 04/10/2022 with Bonnell Public at [place] office ?Advised if symptoms get worse or return, to go to the ED for an evaluation ?Verbalized understanding of plan ? ?

## 2022-04-03 NOTE — Telephone Encounter (Signed)
Patient is returning phone cal. Patient phone number is [phone]. May leave detailed message on voicemail.  ?

## 2022-04-03 NOTE — Telephone Encounter (Signed)
ONO with room air 03/22/22 >> test time 9 hrs 47 min. Baseline SpO2 93%, low SpO2 82%.  Spent 16 min 32 sec with SpO2 < 88%. ? ?Please let her know her oxygen test shows intermittent episodes of low oxygen at night.  This could be related to sleep apnea.  If she is agreeable, then please schedule home sleep study to assess further. ?

## 2022-04-03 NOTE — Telephone Encounter (Signed)
ATC patient.  LMTCB. 

## 2022-04-10 ENCOUNTER — Ambulatory Visit: Payer: Medicaid Other | Admitting: Physician Assistant

## 2022-04-10 ENCOUNTER — Encounter: Payer: Self-pay | Admitting: Physician Assistant

## 2022-04-10 ENCOUNTER — Encounter: Payer: Self-pay | Admitting: *Deleted

## 2022-04-10 VITALS — BP 140/90 | HR 67 | Ht <= 58 in | Wt 131.2 lb

## 2022-04-10 DIAGNOSIS — I6523 Occlusion and stenosis of bilateral carotid arteries: Secondary | ICD-10-CM

## 2022-04-10 DIAGNOSIS — R072 Precordial pain: Secondary | ICD-10-CM

## 2022-04-10 DIAGNOSIS — I10 Essential (primary) hypertension: Secondary | ICD-10-CM

## 2022-04-10 DIAGNOSIS — I25119 Atherosclerotic heart disease of native coronary artery with unspecified angina pectoris: Secondary | ICD-10-CM

## 2022-04-10 DIAGNOSIS — E782 Mixed hyperlipidemia: Secondary | ICD-10-CM

## 2022-04-10 DIAGNOSIS — R079 Chest pain, unspecified: Secondary | ICD-10-CM

## 2022-04-10 MED ORDER — BISOPROLOL FUMARATE 10 MG PO TABS: 10.0000 mg | ORAL_TABLET | Freq: Every day | ORAL | 3 refills | Status: DC

## 2022-04-10 MED ORDER — RANOLAZINE ER 500 MG PO TB12: 500.0000 mg | ORAL_TABLET | Freq: Two times a day (BID) | ORAL | 3 refills | Status: DC

## 2022-04-10 NOTE — Patient Instructions (Signed)
Medication Instructions:  ? ?Stop Taking Verapamil  ?Start Taking Ranexa 500 mg Two Times Daily  ?Start Taking Bisoprolol 10 mg Daily  ? ?*If you need a refill on your cardiac medications before your next appointment, please call your pharmacy* ? ? ?Lab Work: ?Your physician recommends that you return for lab work on the day you come for your stress test.  ? ?If you have labs (blood work) drawn today and your tests are completely normal, you will receive your results only by: ?MyChart Message (if you have MyChart) OR ?A paper copy in the mail ?If you have any lab test that is abnormal or we need to change your treatment, we will call you to review the results. ? ? ?Testing/Procedures: ?Your physician has requested that you have en exercise stress myoview. For further information please visit [email or web address]. Please follow instruction sheet, as given.  ? ? ? ?Follow-Up: ?At [facility], you and your health needs are our priority.  As part of our continuing mission to provide you with exceptional heart care, we have created designated Provider Care Teams.  These Care Teams include your primary Cardiologist (physician) and Advanced Practice Providers (APPs -  Physician Assistants and Nurse Practitioners) who all work together to provide you with the care you need, when you need it. ? ?We recommend signing up for the patient portal called "MyChart".  Sign up information is provided on this After Visit Summary.  MyChart is used to connect with patients for Virtual Visits (Telemedicine).  Patients are able to view lab/test results, encounter notes, upcoming appointments, etc.  Non-urgent messages can be sent to your provider as well.   ?To learn more about what you can do with MyChart, go to [email or web address].   ? ?Your next appointment:   ?2 month(s) ? ?The format for your next appointment:   ?In Person ? ?Provider:   ?Rozann Lesches, MD  ? ? ?Other Instructions ?Thank you for choosing [facility]! ? ? ? ?Important Information About Sugar ? ? ? ? ? ?Two Gram Sodium Diet 2000 mg ? ?What is Sodium? Sodium is a mineral found naturally in many foods. The most significant source of sodium in the diet is table salt, which is about 40% sodium.  Processed, convenience, and preserved foods also contain a large amount of sodium.  The body needs only 500 mg of sodium daily to function,  A normal diet provides more than enough sodium even if you do not use salt. ? ?Why Limit Sodium? A build up of sodium in the body can cause thirst, increased blood pressure, shortness of breath, and water retention.  Decreasing sodium in the diet can reduce edema and risk of heart attack or stroke associated with high blood pressure.  Keep in mind that there are many other factors involved in these health problems.  Heredity, obesity, lack of exercise, cigarette smoking, stress and what you eat all play a role. ? ?General Guidelines: ?Do not add salt at the table or in cooking.  One teaspoon of salt contains over 2 grams of sodium. ?Read food labels ?Avoid processed and convenience foods ?Ask your dietitian before eating any foods not dicussed in the menu planning guidelines ?Consult your physician if you wish to use a salt substitute or a sodium containing medication such as antacids.  Limit milk and milk products to 16 oz (2 cups) per day. ? ?Shopping Hints: ?READ LABELS!! "Dietetic" does not necessarily mean low sodium. ?Salt and other sodium ingredients are  often added to foods during processing. ? ? ? ?Menu Planning Guidelines ?Food Group Choose More Often Avoid  ?Beverages (see also the milk group All fruit juices, low-sodium, salt-free vegetables juices, low-sodium carbonated beverages Regular vegetable or tomato juices, commercially softened water used for drinking or cooking  ?Breads and Cereals Enriched white, wheat, rye and pumpernickel bread, hard rolls and dinner rolls; muffins, cornbread and waffles; most dry  cereals, cooked cereal without added salt; unsalted crackers and breadsticks; low sodium or homemade bread crumbs Bread, rolls and crackers with salted tops; quick breads; instant hot cereals; pancakes; commercial bread stuffing; self-rising flower and biscuit mixes; regular bread crumbs or cracker crumbs  ?Desserts and Sweets Desserts and sweets mad with mild should be within allowance Instant pudding mixes and cake mixes  ?Fats Butter or margarine; vegetable oils; unsalted salad dressings, regular salad dressings limited to 1 Tbs; light, sour and heavy cream Regular salad dressings containing bacon fat, bacon bits, and salt pork; snack dips made with instant soup mixes or processed cheese; salted nuts  ?Fruits Most fresh, frozen and canned fruits Fruits processed with salt or sodium-containing ingredient (some dried fruits are processed with sodium sulfites  ? ? ? ? ? ? ?Vegetables Fresh, frozen vegetables and low- sodium canned vegetables Regular canned vegetables, sauerkraut, pickled vegetables, and others prepared in brine; frozen vegetables in sauces; vegetables seasoned with ham, bacon or salt pork  ?Condiments, Sauces, Miscellaneous ? Salt substitute with physician's approval; pepper, herbs, spices; vinegar, lemon or lime juice; hot pepper sauce; garlic powder, onion powder, low sodium soy sauce (1 Tbs.); low sodium condiments (ketchup, chili sauce, mustard) in limited amounts (1 tsp.) fresh ground horseradish; unsalted tortilla chips, pretzels, potato chips, popcorn, salsa (1/4 cup) Any seasoning made with salt including garlic salt, celery salt, onion salt, and seasoned salt; sea salt, rock salt, kosher salt; meat tenderizers; monosodium glutamate; mustard, regular soy sauce, barbecue, sauce, chili sauce, teriyaki sauce, steak sauce, Worcestershire sauce, and most flavored vinegars; canned gravy and mixes; regular condiments; salted snack foods, olives, picles, relish, horseradish sauce, catsup  ? ?Food  preparation: Try these seasonings ?Meats:    ?Pork Sage, onion Serve with applesauce  ?Chicken Poultry seasoning, thyme, parsley Serve with cranberry sauce  ?Lamb Curry powder, rosemary, garlic, thyme Serve with mint sauce or jelly  ?Veal Marjoram, basil Serve with current jelly, cranberry sauce  ?Beef Pepper, bay leaf Serve with dry mustard, unsalted chive butter  ?[place] leaf, dill Serve with unsalted lemon butter, unsalted parsley butter  ?Vegetables:    ?Asparagus Lemon juice   ?Broccoli Lemon juice   ?Carrots Mustard dressing parsley, mint, nutmeg, glazed with unsalted butter and sugar   ?Green beans Marjoram, lemon juice, nutmeg,dill seed   ?Tomatoes Basil, marjoram, onion   ?Spice /blend for "Salt Shaker" 4 tsp ground thyme ?1 tsp ground sage 3 tsp ground rosemary ?4 tsp ground marjoram  ? ?Test your knowledge ?A product that says "Salt Free" may still contain sodium. True or False ?Garlic Powder and Hot Pepper Sauce an be used as alternative seasonings.True or False ?Processed foods have more sodium than fresh foods.  True or False ?Canned Vegetables have less sodium than froze True or False ? ? ?WAYS TO DECREASE YOUR SODIUM INTAKE ?Avoid the use of added salt in cooking and at the table.  Table salt (and other prepared seasonings which contain salt) is probably one of the greatest sources of sodium in the diet.  Unsalted foods can gain flavor from the  sweet, sour, and butter taste sensations of herbs and spices.  Instead of using salt for seasoning, try the following seasonings with the foods listed.  Remember: how you use them to enhance natural food flavors is limited only by your creativity... ?Allspice-Meat, fish, eggs, fruit, peas, red and yellow vegetables ?Almond Extract-Fruit baked goods ?Anise Seed-Sweet breads, fruit, carrots, beets, cottage cheese, cookies (tastes like licorice) ?Basil-Meat, fish, eggs, vegetables, rice, vegetables salads, soups, sauces ?Bay Leaf-Meat, fish, stews,  poultry ?[place]et-Salad, vegetables (cucumber-like flavor) ?[place] Seed-Bread, cookies, cottage cheese, meat, vegetables, cheese, rice ?Cardamon-Baked goods, fruit, soups ?Celery Powder or seed-Salads, salad dressings, sauces, m

## 2022-04-11 ENCOUNTER — Telehealth: Payer: Self-pay | Admitting: Cardiology

## 2022-04-11 ENCOUNTER — Ambulatory Visit: Payer: Medicaid Other | Admitting: Gastroenterology

## 2022-04-11 ENCOUNTER — Encounter: Payer: Self-pay | Admitting: Gastroenterology

## 2022-04-11 VITALS — BP 142/70 | HR 59 | Ht <= 58 in | Wt 132.0 lb

## 2022-04-11 DIAGNOSIS — R1084 Generalized abdominal pain: Secondary | ICD-10-CM

## 2022-04-11 NOTE — Patient Instructions (Addendum)
  Trial Voltaren 1% gel apply a small amount to the affected area up to 3 times daily  Follow up with pain management to discuss abdominal wall injections.

## 2022-04-11 NOTE — Telephone Encounter (Signed)
   Pt c/o medication issue:  1. Name of Medication: ranolazine (RANEXA) 500 MG 12 hr tablet  2. How are you currently taking this medication (dosage and times per day)?  Take 1 tablet (500 mg total) by mouth 2 (two) times daily.  3. Are you having a reaction (difficulty breathing--STAT)?   4. What is your medication issue? Pt would like to ask Dr. Domenic Polite if she tried ranexa before, because she remembered the name and sure this is the meds that didn't work for her. Also, se wanted to ask Dr. Domenic Polite if its ok for to take the bisoprolol (ZEBETA) 10 MG tablet

## 2022-04-11 NOTE — Telephone Encounter (Signed)
Pt c/o medication issue:  1. Name of Medication:   ranolazine (RANEXA) 500 MG 12 hr tablet    2. How are you currently taking this medication (dosage and times per day)? Take 1 tablet (500 mg total) by mouth 2 (two) times daily.  3. Are you having a reaction (difficulty breathing--STAT)? No  4. What is your medication issue?  Pt states that this medication does not work for her. She says that Dr. Domenic Polite is aware of this.   Pt also states that she is unable to take Stress Test. Please advise

## 2022-04-11 NOTE — Progress Notes (Signed)
Referring Provider: Leeanne Rio, MD Primary Care Physician:  Leeanne Rio, MD  Chief complaint: Epigastric pain   IMPRESSION:  Chronic abdominal pain in the setting of prior cholecystectomy.  She has been evaluated by multiple gastroenterologist for chronic symptoms.  Unclear etiology of patient's symptoms, however work-up including endoscopy, cross-sectional imaging, and labs all grossly normal within the several years without a significant change in her symptoms during this time.I feel that a large component of her symptoms are MSK and or adhesions. I recommend a trial of Voltaren gel and referral to pain management for consideration of trigger point injections.   She is concerned that her pain management specialist will not allow her to see someone else for abdominal wall injections but they don't offer them.   PLAN: - Trial Voltaren 1% gel apply a small amount to the affected area up to 3 times daily - Pain management referral for abdominal wall injections - Obtain breath test results from [place] - No additional GI evaluation recommended at this time    HPI: Victoria Townsend is a 61 y.o. female presents today for evaluation of epigastric pain.  This is my first office visit with the patient.  I did meet her at the time of upper endoscopy.  She was last seen at [facility] by Ellouise Newer in 08/28/2021.  She has a history of anxiety, CAD, GERD, PTSD, chronic pain in multiple places including the left shoulder, low back with left lower extremity radiculopathy, and abdominal pain.  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  She has had a cholecystectomy and appendectomy.    She has previously been evaluated by multiple gastroenterologists but today denies seeing any prior gastroenterologists. However, records available to me including prior visits with Dr. Laural Golden in [place] and also seen by [place] after her first visit here in 2020 and at  [facility] in 2021.  Records in care everywhere suggest that she may have also been seen at [facility].  She was seen by a pain specialist affiliated with [facility] 04/01/2022 for shoulder pain.  Recurrent visits for chronic right upper quadrant abdominal pain that initially developed 9 years ago.  Pain previously attributed to adhesions. "no one can figure out what is wrong".  She describes her pain as an intermittent "knot" or "stinging" that is present most days of the week and will last up to 1 day in length.  Her symptoms are exacerbated by bending over and walking in particular and possibly increased with movement in general.  There is some improvement with defecation.  No change with oral intake. Weight is stable.   Evaluation by multiple gastroenterologists has included multiple EGDs with biopsies, colonoscopy, CT scan x2, mesenteric duplex, MRI, and pelvic images. She had a breath test for SIBO that she remembers being normal.   Followed by pain management but they have been unable to offer the abdominal injections that have been recommended by prior [place].   Evaluation of pain at multiple GI practices including [facility] includes: - EGD 08/09/2013: Small hiatal hernia and a few 1 to 2 mm gastric hyperplastic polyps.  No H. Pylori - CT of the abdomen with contrast 12/10/2017: No acute findings, colonic diverticulosis, nonobstructing right renal calculus - EGD 02/08/2018: Normal esophagus, empiric dilation performed  with a 56 Edwards with mild resistance, otherwise normal, no specimens - Colonoscopy 02/08/2018: Internal hemorrhoids, one 4 mm tubular adenomatous polyp, left-sided diverticulosis.  Surveillance colonoscopy recommended in 7 years. - CT abdomen and pelvis with contrast 07/05/2019: Two nonobstructing bilateral renal calculi measuring up to 3 mm. No Hydronephrosis.  Status post cholecystectomy and appendectomy. No evidence of bowel obstruction.  No CT  findings to account for the patient's chronic upper abdominal pain. - Mesenteric ultrasound 08/19/2019: Normal - MRI of the abdomen 10/19/2019: Image degradation by motion artifact otherwise normal - CT abd/pelvis 05/16/2021: gastric cardia thickening, diverticulosis - Pelvic ultrasound 08/21/21: fluid within the uterus and cervix - Endovaginal ultrasound 11/01/21: Normal - EGD 10/10/21: - Gastritis, small hiatal hernia, gastric polyps. Biopsies showed normal esophagus, fundic gland polyps, gastropathy, and normal duodenal biopsies.    Treatment attempts: -Dicyclomine -Acetaminophen -Heating pad -Lidoderm patch -Percocet prescribed by pain clinics -Amitriptyline (she took Elavil for neuropsych issues without improvement in GI symptoms) -Abdominal wall injections recommended but have never been performed -Gabapentin and Percocet are prescribed by her pain management team -Oxycodone/APAP prescribed at her pain visit 04/01/2022   Past Medical History:  Diagnosis Date   Agoraphobia    Anxiety disorder    Arthritis    Asthma    Bladder polyps    CAD (coronary artery disease)    Cardiac CTA June 2020   Colon polyps    COPD (chronic obstructive pulmonary disease) ([place])    Depression    Emphysema lung ([place])    Essential hypertension    Fibromyalgia    Gallstones    GERD (gastroesophageal reflux disease)    Herpes    Irregular heartbeat    Nephrolithiasis    Polysubstance abuse ([place])    History of cocaine, alcohol, marijuana in the past   PTSD (post-traumatic stress disorder)    Shoulder pain    Shoulder has been injected    Past Surgical History:  Procedure Laterality Date   APPENDECTOMY     Bladder polyps     CESAREAN SECTION WITH BILATERAL TUBAL LIGATION     CHOLECYSTECTOMY     COLONOSCOPY  May 2012   Dr. Posey Pronto: scattered diverticula pancolonic   COLONOSCOPY WITH PROPOFOL N/A 01/29/2018   Procedure: COLONOSCOPY WITH PROPOFOL;  Surgeon: Daneil Dolin, MD;  [facility];  Service: Endoscopy;  Laterality: N/A;   ESOPHAGOGASTRODUODENOSCOPY  April 2012   Dr. Posey Pronto: gastritis   ESOPHAGOGASTRODUODENOSCOPY N/A 08/09/2013   [id number] polyps/abnormal gastric because of uncertain significance-status post biopsy. Small hiatal [email or web address] chronic inflammation on bx. hyperplastic polyp   ESOPHAGOGASTRODUODENOSCOPY (EGD) WITH PROPOFOL N/A 01/29/2018   Procedure: ESOPHAGOGASTRODUODENOSCOPY (EGD) WITH PROPOFOL;  Surgeon: Daneil Dolin, MD;  [facility];  Service: Endoscopy;  Laterality: N/A;  1:30   MALONEY DILATION N/A 01/29/2018   Procedure: Venia Minks DILATION;  Surgeon: Daneil Dolin, MD;  [facility];  Service: Endoscopy;  Laterality: N/A;   URETHRAL STRICTURE DILATATION     WRIST FRACTURE SURGERY Right     Current Outpatient Medications  Medication Sig Dispense Refill   albuterol (VENTOLIN HFA) 108 (90 Base) MCG/ACT inhaler Inhale 2 puffs into the lungs every 6 (six) hours as needed for shortness of breath.     ALPRAZolam (XANAX) 1 MG tablet Take 1 mg by mouth 4 (four) times daily.     aspirin 81 MG EC tablet Take 81 mg by mouth daily as needed (for chest pain).     atorvastatin (LIPITOR) 10 MG tablet Take 1 tablet by mouth daily.     bisoprolol (ZEBETA) 10 MG tablet Take 1 tablet (10 mg total) by  mouth daily. 90 tablet 3   cholecalciferol (VITAMIN D3) 25 MCG (1000 UNIT) tablet Take 1,000 Units by mouth daily.     fluticasone-salmeterol (ADVAIR DISKUS) 100-50 MCG/ACT AEPB Inhale 1 puff into the lungs 2 (two) times daily. 60 each 6   gabapentin (NEURONTIN) 400 MG capsule Take 400 mg by mouth 3 (three) times daily.     hydrocortisone 2.5 % ointment APPLY AS DIRECTED DAILY AS NEEDED FOR ITCHING. 28.35 g 2   lidocaine (LIDODERM) 5 % Place 1 patch onto the skin every 12 (twelve) hours.     Multiple Vitamin (MULTI-DAY PO) Take by mouth.     nitroGLYCERIN (NITROSTAT) 0.4 MG SL tablet DISSOLVE 1 TABLET UNDER THE TONGUE EVERY 5 MINUTES AS NEEDED  FOR CHEST PAIN. DO NOT EXCEED A TOTAL OF 3 DOSES IN 15 MINUTES. 25 tablet 1   oxybutynin (DITROPAN) 5 MG tablet Take 2.5 mg by mouth as needed.   3   oxyCODONE-acetaminophen (PERCOCET/ROXICET) 5-325 MG tablet Take 1 tablet by mouth 3 (three) times daily as needed.     pantoprazole (PROTONIX) 20 MG tablet Take 20 mg by mouth daily.     ranolazine (RANEXA) 500 MG 12 hr tablet Take 1 tablet (500 mg total) by mouth 2 (two) times daily. 180 tablet 3   RESTASIS 0.05 % ophthalmic emulsion Place 1 drop into both eyes 2 (two) times daily.     valACYclovir (VALTREX) 500 MG tablet Take 1 tablet by mouth daily.     vitamin C (ASCORBIC ACID) 500 MG tablet Take 500 mg by mouth daily.     No current facility-administered medications for this visit.    Allergies as of 04/11/2022 - Review Complete 04/11/2022  Allergen Reaction Noted   Ciprofloxacin Swelling and Other (See Comments) 04/12/2013   Codeine Nausea And Vomiting    Ibuprofen Other (See Comments)    Symbicort [budesonide-formoterol fumarate] Other (See Comments) 06/21/2020   Erythromycin Other (See Comments) 08/16/2014   Imdur [isosorbide nitrate] Other (See Comments) 06/22/2019   Mucinex [guaifenesin er] Nausea And Vomiting 01/21/2018   Nsaids Other (See Comments) 12/31/2021   Roxicodone [oxycodone] Swelling 01/21/2018   Shellfish allergy Rash 04/12/2013    Family History  Problem Relation Age of Onset   Coronary artery disease Mother        CABG   Diabetes Mother    Heart attack Mother    Liver cancer Sister    Alcohol abuse Sister    Drug abuse Sister    Clotting disorder Sister    Alcohol abuse Brother    Drug abuse Brother    Diabetes Brother    Diabetes Brother    Heart disease Brother    Obesity Brother    Alcohol abuse Brother    Drug abuse Brother    Stomach cancer Paternal Grandmother 69   Stomach cancer Maternal Aunt 60   Ovarian cancer Maternal Aunt    Breast cancer Paternal Aunt    Colon cancer Paternal Uncle 52    Fibromyalgia Daughter    Thyroid disease Daughter    Anxiety disorder Daughter      Physical Exam: General:   Alert,  well-nourished, pleasant and cooperative in NAD Head:  Normocephalic and atraumatic. Eyes:  Sclera clear, no icterus.   Conjunctiva pink. Abdomen:  Soft, nondistended, normal bowel sounds, no rebound or guarding.  Cholecystectomy scar.  No hepatosplenomegaly.  She localizes the pain to the left mid abdomen and right mid abdomen.  I am unable to reproduce her  symptoms on exam.  Negative Carnett's sign. Neurologic:  Alert and  oriented x4;  grossly nonfocal Skin:  Intact without significant lesions or rashes. Psych:  Alert and cooperative. Normal mood and affect.     Oluwanifemi Petitti L. Tarri Glenn, MD, MPH 04/11/2022, 2:35 PM

## 2022-04-11 NOTE — Telephone Encounter (Signed)
Patient told staff the last time she had a lexiscan it "burned". Now she states she canot walk on a treadmill.      I will defer to M.Bonnell Public, PA-C

## 2022-04-12 MED ORDER — AMLODIPINE BESYLATE 5 MG PO TABS: 5.0000 mg | ORAL_TABLET | Freq: Every day | ORAL | 3 refills | Status: DC

## 2022-04-12 NOTE — Telephone Encounter (Signed)
Please defer to phone note from 04/11/22 regarding medication changes.

## 2022-04-12 NOTE — Telephone Encounter (Signed)
Spoke with pt who states that she is unable to do a Lexi scan stress test. Pt declines taking Ranexa at this time. She does not remember the reaction that she had on this medication she just remember that she can not take it. Pt states that she will try the Norvasc 5 mg.

## 2022-04-15 ENCOUNTER — Telehealth: Payer: Self-pay | Admitting: Cardiology

## 2022-04-15 NOTE — Telephone Encounter (Signed)
Patient requesting to have MRI of her heart done.  Chest pain coming & going for quite a while.  States her face is getting numb off / on for about a week.  Feeling very weak and exhausted.  Has not been back to ED since last OV with Estella Husk - last seen 04/10/2022.  See notes - patient declined recent stress testing.  Will send message to provider but have suggested she go to ED in light of all symptoms noted above.

## 2022-04-15 NOTE — Telephone Encounter (Signed)
Pt would like to know if an MRI can be ordered for her. Pt would also like a callback. Please advise

## 2022-04-16 NOTE — Telephone Encounter (Signed)
Patient notified and verbalized understanding. 

## 2022-04-18 ENCOUNTER — Telehealth: Payer: Self-pay

## 2022-04-18 NOTE — Telephone Encounter (Signed)
Prior Authorization for Bisoprolol 10 mg tablets approved.  PA Case: [id number], Status: Approved, Coverage Starts on: 04/18/2022 12:00:00 AM, Coverage Ends on: 04/18/2023 12:00:00 AM.

## 2022-04-24 ENCOUNTER — Ambulatory Visit: Payer: Medicaid Other

## 2022-04-24 ENCOUNTER — Encounter (HOSPITAL_COMMUNITY): Payer: Medicaid Other

## 2022-05-03 ENCOUNTER — Telehealth: Payer: Self-pay | Admitting: Cardiology

## 2022-05-03 NOTE — Telephone Encounter (Signed)
I spoke with patient and assured her the BP was normal.

## 2022-05-03 NOTE — Telephone Encounter (Signed)
[facility] 130/72. Please advise

## 2022-05-06 ENCOUNTER — Ambulatory Visit: Payer: Medicaid Other

## 2022-05-14 ENCOUNTER — Encounter (HOSPITAL_COMMUNITY): Payer: Medicaid Other

## 2022-05-14 ENCOUNTER — Other Ambulatory Visit (HOSPITAL_COMMUNITY): Payer: Medicaid Other

## 2022-05-16 ENCOUNTER — Ambulatory Visit: Payer: Medicaid Other | Admitting: Physician Assistant

## 2022-05-30 ENCOUNTER — Ambulatory Visit: Payer: Medicaid Other | Admitting: Urology

## 2022-05-30 ENCOUNTER — Encounter: Payer: Self-pay | Admitting: Urology

## 2022-05-30 VITALS — BP 149/69 | HR 50 | Ht <= 58 in | Wt 135.8 lb

## 2022-05-30 DIAGNOSIS — R109 Unspecified abdominal pain: Secondary | ICD-10-CM

## 2022-05-30 DIAGNOSIS — R350 Frequency of micturition: Secondary | ICD-10-CM | POA: Diagnosis not present

## 2022-05-30 DIAGNOSIS — N200 Calculus of kidney: Secondary | ICD-10-CM

## 2022-05-30 LAB — URINALYSIS, ROUTINE W REFLEX MICROSCOPIC
Bilirubin, UA: NEGATIVE
Glucose, UA: NEGATIVE
Ketones, UA: NEGATIVE
Leukocytes,UA: NEGATIVE
Nitrite, UA: NEGATIVE
Protein,UA: NEGATIVE
Specific Gravity, UA: <1.005 — ABNORMAL LOW (ref 1.005–1.030)
Urobilinogen, Ur: 0.2 mg/dL (ref 0.2–1.0)
pH, UA: 5.5 (ref 5.0–7.5)

## 2022-05-30 LAB — MICROSCOPIC EXAMINATION
Bacteria, UA: NONE SEEN
Renal Epithel, UA: NONE SEEN /hpf
WBC, UA: NONE SEEN /hpf (ref 0–5)

## 2022-05-30 LAB — BLADDER SCAN AMB NON-IMAGING: Scan Result: 136

## 2022-05-30 MED ORDER — MIRABEGRON ER 25 MG PO TB24: 25.0000 mg | ORAL_TABLET | Freq: Every day | ORAL | 0 refills | Status: DC

## 2022-05-30 NOTE — Progress Notes (Signed)
Assessment: 1. Nephrolithiasis   2. Urinary frequency   3. Flank pain      Plan: Trial of Myrbetriq 25 mg daily.  Samples given. Discontinue oxybutynin CT renal stone protocol for evaluation of flank pain and history of nephrolithiasis Return to office after CT done  Chief Complaint:  Chief Complaint  Patient presents with   Nephrolithiasis    History of Present Illness:  Victoria Townsend is a 61 y.o. year old female who is seen in consultation from Leeanne Rio, MD for evaluation of nephrolithiasis, flank pain, and urinary frequency.  She has a prior history of nephrolithiasis, undergoing shockwave lithotripsy >10 years ago.  CT imaging from 8/20 showed a 3 mm nonobstructing left lower pole renal calculus and a 3 mm nonobstructing right lower pole renal calculus.  CT from 6/22 again showed nonobstructing bilateral renal calculi.  She reports onset of right-sided flank pain approximately 1 month ago.  She reports her pain as sharp and stabbing in nature.  She has not had any symptoms within the past week.  No nausea, vomiting, fever, or chills.  No recent imaging.  She has a history of urinary frequency and nocturia.  She previously underwent urethral dilations by Dr. Michela Pitcher.  She is currently on oxybutynin 5 mg daily.  She continues to have frequent urination and nocturia x6.  No incontinence.  No dysuria or gross hematuria.   Past Medical History:  Past Medical History:  Diagnosis Date   Agoraphobia    Anxiety disorder    Arthritis    Asthma    Bladder polyps    CAD (coronary artery disease)    Cardiac CTA June 2020   Colon polyps    COPD (chronic obstructive pulmonary disease) ([facility])    Depression    Emphysema lung ([facility])    Essential hypertension    Fibromyalgia    Gallstones    GERD (gastroesophageal reflux disease)    Herpes    Irregular heartbeat    Nephrolithiasis    Polysubstance abuse ([facility])    History of cocaine, alcohol, marijuana in the past   PTSD  (post-traumatic stress disorder)    Shoulder pain    Shoulder has been injected    Past Surgical History:  Past Surgical History:  Procedure Laterality Date   APPENDECTOMY     Bladder polyps     CESAREAN SECTION WITH BILATERAL TUBAL LIGATION     CHOLECYSTECTOMY     COLONOSCOPY  May 2012   Dr. Posey Pronto: scattered diverticula pancolonic   COLONOSCOPY WITH PROPOFOL N/A 01/29/2018   Procedure: COLONOSCOPY WITH PROPOFOL;  Surgeon: Daneil Dolin, MD;  [facility];  Service: Endoscopy;  Laterality: N/A;   ESOPHAGOGASTRODUODENOSCOPY  April 2012   Dr. Posey Pronto: gastritis   ESOPHAGOGASTRODUODENOSCOPY N/A 08/09/2013   [id number] polyps/abnormal gastric because of uncertain significance-status post biopsy. Small hiatal PrismBlog.es chronic inflammation on bx. hyperplastic polyp   ESOPHAGOGASTRODUODENOSCOPY (EGD) WITH PROPOFOL N/A 01/29/2018   Procedure: ESOPHAGOGASTRODUODENOSCOPY (EGD) WITH PROPOFOL;  Surgeon: Daneil Dolin, MD;  [facility];  Service: Endoscopy;  Laterality: N/A;  1:30   MALONEY DILATION N/A 01/29/2018   Procedure: Venia Minks DILATION;  Surgeon: Daneil Dolin, MD;  [facility];  Service: Endoscopy;  Laterality: N/A;   URETHRAL STRICTURE DILATATION     WRIST FRACTURE SURGERY Right     Allergies:  Allergies  Allergen Reactions   Ciprofloxacin Swelling and Other (See Comments)    Facial swelling   Codeine  Nausea And Vomiting   Ibuprofen Other (See Comments)    Oral cavity burning sensations   Symbicort [Budesonide-Formoterol Fumarate] Other (See Comments)    "Took breath away"   Erythromycin Other (See Comments)    Facial swelling   Imdur [Isosorbide Nitrate] Other (See Comments)    Headaches   Mucinex [Guaifenesin Er] Nausea And Vomiting   Nsaids Other (See Comments)    She reports she does not take.    Roxicodone [Oxycodone] Swelling    Mouth swelling   Shellfish Allergy Rash    Family History:  Family History  Problem Relation Age of  Onset   Coronary artery disease Mother        CABG   Diabetes Mother    Heart attack Mother    Liver cancer Sister    Alcohol abuse Sister    Drug abuse Sister    Clotting disorder Sister    Alcohol abuse Brother    Drug abuse Brother    Diabetes Brother    Diabetes Brother    Heart disease Brother    Obesity Brother    Alcohol abuse Brother    Drug abuse Brother    Stomach cancer Paternal Grandmother 93   Stomach cancer Maternal Aunt 60   Ovarian cancer Maternal Aunt    Breast cancer Paternal Aunt    Colon cancer Paternal Uncle 12   Fibromyalgia Daughter    Thyroid disease Daughter    Anxiety disorder Daughter     Social History:  Social History   Tobacco Use   Smoking status: Every Day    Packs/day: 0.50    Years: 45.00    Total pack years: 22.50    Types: Cigarettes    Start date: 06/10/1974   Smokeless tobacco: Never  Vaping Use   Vaping Use: Never used  Substance Use Topics   Alcohol use: No    Alcohol/week: 0.0 standard drinks of alcohol    Comment: Former user, heavy etoh use prior to 1990s.   Drug use: Not Currently    Types: Marijuana, Cocaine    Comment: 04/13/19-no cocaine; denies marijuana    Review of symptoms:  Constitutional:  Negative for unexplained weight loss, night sweats, fever, chills ENT:  Negative for nose bleeds, sinus pain, painful swallowing CV:  Negative for chest pain, shortness of breath, exercise intolerance, palpitations, loss of consciousness Resp:  Negative for cough, wheezing, shortness of breath GI:  Negative for nausea, vomiting, diarrhea, bloody stools GU:  Positives noted in HPI; otherwise negative for gross hematuria, dysuria, urinary incontinence Neuro:  Negative for seizures, poor balance, limb weakness, slurred speech Psych:  Negative for lack of energy, depression, anxiety Endocrine:  Negative for polydipsia, polyuria, symptoms of hypoglycemia (dizziness, hunger, sweating) Hematologic:  Negative for anemia, purpura,  petechia, prolonged or excessive bleeding, use of anticoagulants  Allergic:  Negative for difficulty breathing or choking as a result of exposure to anything; no shellfish allergy; no allergic response (rash/itch) to materials, foods  Physical exam: BP (!) 149/69   Pulse (!) 50   Ht '4\' 10"'$  (1.473 m)   Wt 135 lb 12.8 oz (61.6 kg)   BMI 28.38 kg/m  GENERAL APPEARANCE:  Well appearing, well developed, well nourished, NAD HEENT: Atraumatic, Normocephalic, oropharynx clear. NECK: Supple without lymphadenopathy or thyromegaly. LUNGS: Clear to auscultation bilaterally. HEART: Regular Rate and Rhythm without murmurs, gallops, or rubs. ABDOMEN: Soft, non-tender, No Masses. EXTREMITIES: Moves all extremities well.  Without clubbing, cyanosis, or edema. NEUROLOGIC:  Alert and  oriented x 3, normal gait, CN II-XII grossly intact.  MENTAL STATUS:  Appropriate. BACK:  Non-tender to palpation.  No CVAT SKIN:  Warm, dry and intact.    Results: U/A:  0-2 RBC  PVR = 136 ml

## 2022-06-07 ENCOUNTER — Ambulatory Visit: Payer: Medicaid Other | Admitting: Pulmonary Disease

## 2022-06-18 ENCOUNTER — Ambulatory Visit (HOSPITAL_COMMUNITY)
Admission: RE | Admit: 2022-06-18 | Discharge: 2022-06-18 | Disposition: A | Discharge status: Home / Self Care | Payer: Medicaid Other | Source: Ambulatory Visit | Attending: Urology | Admitting: Urology

## 2022-06-18 DIAGNOSIS — R109 Unspecified abdominal pain: Secondary | ICD-10-CM | POA: Insufficient documentation

## 2022-06-18 DIAGNOSIS — N200 Calculus of kidney: Secondary | ICD-10-CM

## 2022-06-18 DIAGNOSIS — R10A0 Flank pain, unspecified side: Secondary | ICD-10-CM

## 2022-06-19 ENCOUNTER — Telehealth: Payer: Self-pay

## 2022-06-19 NOTE — Telephone Encounter (Signed)
Made patient aware that she has small stones in both kidneys per Dr. Felipa Eth and to keep keep follow up appt. Patient voiced understanding.

## 2022-06-19 NOTE — Telephone Encounter (Signed)
-----   Message from Primus Bravo, MD sent at 06/19/2022  8:47 AM EDT ----- Please let the patient know that her CT scan shows some small stones in both kidneys, no ureteral calculi or evidence of obstruction.  No abnormalities on the CT scan to explain her flank/back pain from a urinary standpoint. Keep follow-up appointment as scheduled.

## 2022-06-20 ENCOUNTER — Telehealth: Payer: Self-pay | Admitting: Gastroenterology

## 2022-06-20 NOTE — Telephone Encounter (Signed)
Spoke with patient regarding MD recommendations. She prefers to be seen with [facility]. Referral has been placed, and patient aware to call back in two weeks if she has not heard from their office regarding an appointment. In the meantime, patient plans to go to [facility] or [facility] to be evaluated. Address & numbers provided per pt request. She has been advised to call back with any questions.

## 2022-06-20 NOTE — Telephone Encounter (Signed)
Inbound call from patient stating that she is having abd pain on both side of her stomach. Patient stated that since she had her upper endoscopy in November 2022 she has been dealing with the patient. Patient seen Dr. Tarri Glenn on 5/18 and patient stated all she did was sit there like a puppet and get voltaren and she was not able to do that because I has a lot of  life threatening side effects. Patient is requesting a call back to discuss. Please advise.

## 2022-06-20 NOTE — Telephone Encounter (Signed)
Patient called in with complaints of intermittent, sharp generalized abdominal pain since EGD in 09/2021. She was seen for an OV on 04/11/22 with Dr. Tarri Glenn and was advised to use voltaren gel, and contact the pain clinic for injections. Patient states "I'm not taking any of that [place] pig shit" in regards to voltaren gel, and that pain clinic would not do the injections. She denies any n/v, and is having normal bowel movements. She states "I'm suffering like a dog, and the only relief I feel is when I squish my fist into both sides of my stomach." She does currently take 5 mg of percocet daily, but has no relief. Patient was advised that if she is experiencing severe abdominal pain that she should be evaluated in ED, but refused. She says all of this started from the EGD, and would like further recommendations from Dr. Tarri Glenn.

## 2022-07-03 ENCOUNTER — Ambulatory Visit: Payer: Medicaid Other | Admitting: Urology

## 2022-07-03 NOTE — Progress Notes (Deleted)
Assessment: 1. Nephrolithiasis   2. Urinary frequency   3. Flank pain     Plan: Trial of Myrbetriq 25 mg daily.  Samples given. Return to office after CT done  Chief Complaint:  No chief complaint on file.   History of Present Illness:  Victoria Townsend is a 61 y.o. year old female who is seen for further evaluation of nephrolithiasis, flank pain, and urinary frequency.  She has a prior history of nephrolithiasis, undergoing shockwave lithotripsy >10 years ago.  CT imaging from 8/20 showed a 3 mm nonobstructing left lower pole renal calculus and a 3 mm nonobstructing right lower pole renal calculus.  CT from 6/22 again showed nonobstructing bilateral renal calculi.  She reported onset of right-sided flank pain in June 2023.  She reported her pain as sharp and stabbing in nature.  No nausea, vomiting, fever, or chills.   CT imaging from 06/18/2022 showed tiny bilateral renal calculi, no ureteral calculi and no evidence of obstruction.  She has a history of urinary frequency and nocturia.  She previously underwent urethral dilations by Dr. Michela Pitcher.  She was on oxybutynin 5 mg daily.  She continued to have frequent urination and nocturia x6.  No incontinence.  No dysuria or gross hematuria.  She was given a trial of Myrbetriq 25 mg daily in July 2023.  Portions of the above documentation were copied from a prior visit for review purposes only.   Past Medical History:  Past Medical History:  Diagnosis Date   Agoraphobia    Anxiety disorder    Arthritis    Asthma    Bladder polyps    CAD (coronary artery disease)    Cardiac CTA June 2020   Colon polyps    COPD (chronic obstructive pulmonary disease) (HCC)    Depression    Emphysema lung (HCC)    Essential hypertension    Fibromyalgia    Gallstones    GERD (gastroesophageal reflux disease)    Herpes    Irregular heartbeat    Nephrolithiasis    Polysubstance abuse (HCC)    History of cocaine, alcohol, marijuana in the past    PTSD (post-traumatic stress disorder)    Shoulder pain    Shoulder has been injected    Past Surgical History:  Past Surgical History:  Procedure Laterality Date   APPENDECTOMY     Bladder polyps     CESAREAN SECTION WITH BILATERAL TUBAL LIGATION     CHOLECYSTECTOMY     COLONOSCOPY  May 2012   Dr. Posey Pronto: scattered diverticula pancolonic   COLONOSCOPY WITH PROPOFOL N/A 01/29/2018   Procedure: COLONOSCOPY WITH PROPOFOL;  Surgeon: Daneil Dolin, MD;  [facility];  Service: Endoscopy;  Laterality: N/A;   ESOPHAGOGASTRODUODENOSCOPY  April 2012   Dr. Posey Pronto: gastritis   ESOPHAGOGASTRODUODENOSCOPY N/A 08/09/2013   YOV:[id number] polyps/abnormal gastric because of uncertain significance-status post biopsy. Small hiatal [email or web address] chronic inflammation on bx. hyperplastic polyp   ESOPHAGOGASTRODUODENOSCOPY (EGD) WITH PROPOFOL N/A 01/29/2018   Procedure: ESOPHAGOGASTRODUODENOSCOPY (EGD) WITH PROPOFOL;  Surgeon: Daneil Dolin, MD;  [facility];  Service: Endoscopy;  Laterality: N/A;  1:30   MALONEY DILATION N/A 01/29/2018   Procedure: Venia Minks DILATION;  Surgeon: Daneil Dolin, MD;  [facility];  Service: Endoscopy;  Laterality: N/A;   URETHRAL STRICTURE DILATATION     WRIST FRACTURE SURGERY Right     Allergies:  Allergies  Allergen Reactions   Ciprofloxacin Swelling and Other (See Comments)  Facial swelling   Codeine Nausea And Vomiting   Ibuprofen Other (See Comments)    Oral cavity burning sensations   Symbicort [Budesonide-Formoterol Fumarate] Other (See Comments)    "Took breath away"   Erythromycin Other (See Comments)    Facial swelling   Imdur [Isosorbide Nitrate] Other (See Comments)    Headaches   Mucinex [Guaifenesin Er] Nausea And Vomiting   Nsaids Other (See Comments)    She reports she does not take.    Roxicodone [Oxycodone] Swelling    Mouth swelling   Shellfish Allergy Rash    Family History:  Family History  Problem Relation  Age of Onset   Coronary artery disease Mother        CABG   Diabetes Mother    Heart attack Mother    Liver cancer Sister    Alcohol abuse Sister    Drug abuse Sister    Clotting disorder Sister    Alcohol abuse Brother    Drug abuse Brother    Diabetes Brother    Diabetes Brother    Heart disease Brother    Obesity Brother    Alcohol abuse Brother    Drug abuse Brother    Stomach cancer Paternal Grandmother 8   Stomach cancer Maternal Aunt 60   Ovarian cancer Maternal Aunt    Breast cancer Paternal Aunt    Colon cancer Paternal Uncle 32   Fibromyalgia Daughter    Thyroid disease Daughter    Anxiety disorder Daughter     Social History:  Social History   Tobacco Use   Smoking status: Every Day    Packs/day: 0.50    Years: 45.00    Total pack years: 22.50    Types: Cigarettes    Start date: 06/10/1974   Smokeless tobacco: Never  Vaping Use   Vaping Use: Never used  Substance Use Topics   Alcohol use: No    Alcohol/week: 0.0 standard drinks of alcohol    Comment: Former user, heavy etoh use prior to 1990s.   Drug use: Not Currently    Types: Marijuana, Cocaine    Comment: 04/13/19-no cocaine; denies marijuana    ROS: Constitutional:  Negative for fever, chills, weight loss CV: Negative for chest pain, previous MI, hypertension Respiratory:  Negative for shortness of breath, wheezing, sleep apnea, frequent cough GI:  Negative for nausea, vomiting, bloody stool, GERD  Physical exam: There were no vitals taken for this visit. GENERAL APPEARANCE:  Well appearing, well developed, well nourished, NAD HEENT:  Atraumatic, normocephalic, oropharynx clear NECK:  Supple without lymphadenopathy or thyromegaly ABDOMEN:  Soft, non-tender, no masses EXTREMITIES:  Moves all extremities well, without clubbing, cyanosis, or edema NEUROLOGIC:  Alert and oriented x 3, normal gait, CN II-XII grossly intact MENTAL STATUS:  appropriate BACK:  Non-tender to palpation, No  CVAT SKIN:  Warm, dry, and intact  Results: U/A:

## 2022-07-10 ENCOUNTER — Encounter: Payer: Self-pay | Admitting: Student

## 2022-07-10 ENCOUNTER — Ambulatory Visit: Payer: Medicaid Other | Admitting: Student

## 2022-07-10 VITALS — BP 144/78 | HR 53 | Ht <= 58 in | Wt 132.8 lb

## 2022-07-10 DIAGNOSIS — I10 Essential (primary) hypertension: Secondary | ICD-10-CM | POA: Diagnosis not present

## 2022-07-10 DIAGNOSIS — I25119 Atherosclerotic heart disease of native coronary artery with unspecified angina pectoris: Secondary | ICD-10-CM

## 2022-07-10 DIAGNOSIS — E782 Mixed hyperlipidemia: Secondary | ICD-10-CM | POA: Diagnosis not present

## 2022-07-10 DIAGNOSIS — Z720 Tobacco use: Secondary | ICD-10-CM | POA: Diagnosis not present

## 2022-07-10 MED ORDER — AMLODIPINE BESYLATE 10 MG PO TABS: 10.0000 mg | ORAL_TABLET | Freq: Every day | ORAL | 3 refills | Status: DC

## 2022-07-10 NOTE — Progress Notes (Signed)
Cardiology Office Note    Date:  07/10/2022   ID:  [name], DOB 02-04-1961, MRN [id number]  PCP:  Leeanne Rio, MD  Cardiologist: Rozann Lesches, MD    Chief Complaint  Patient presents with   Follow-up    3 month visit    History of Present Illness:    Victoria Townsend is a 61 y.o. female with past medical history of CAD (s/p Coronary CT in 04/2019 showing severe stenosis along the distal LAD and distal RCA with aggressive medical management recommended), HTN, HLD, anxiety, PTSD and tobacco use who presents to the office today for 17-monthfollow-up.  She was examined by MErmalinda Barrios PA-C in 03/2022 and reported a variety of issues including numbness along the left side of her face along with intermittent tightness along her neck and chest. She had been evaluated by EMS and BP was elevated. Reported that she had black mold in her home and thought this could be contributing to her symptoms. Verapamil was discontinued and she was started on Bisoprolol 10 mg daily along with restarting Ranexa 500 mg twice daily (previously intolerant to Imdur).  An Exercise Myoview was recommended for further evaluation. It appears this was not performed as she did not want to walk on the treadmill and declined Lexiscan. It appears she was ultimately started on Amlodipine 5 mg daily by review of telephone notes. She did call the office in the interim reporting recurrent chest pain and Emergency Department evaluation was recommended but was not pursued by review of records.  In talking with the patient today, she continues to experience a variety of issues with her most pressing one at this time being worsening abdominal pain. [place] [facility] [place] this month. Her pain is not associated with food consumption. Also has significant pain along her shoulder joints and hips. She reports occasional discomfort under her left breast which  can occur at rest. She does walk for exercise down her road and reports having dyspnea with inclines but this has been stable. Says her lungs hurt at different times. Still smoking 1 ppd but hopeful to quit. No recurrent chest pain with walking following medication adjustments at her last visit. No specific orthopnea, PND or pitting edema.    Past Medical History:  Diagnosis Date   Agoraphobia    Anxiety disorder    Arthritis    Asthma    Bladder polyps    CAD (coronary artery disease)    Cardiac CTA June 2020   Colon polyps    COPD (chronic obstructive pulmonary disease) (HCC)    Depression    Emphysema lung (HCC)    Essential hypertension    Fibromyalgia    Gallstones    GERD (gastroesophageal reflux disease)    Herpes    Irregular heartbeat    Nephrolithiasis    Polysubstance abuse (HCC)    History of cocaine, alcohol, marijuana in the past   PTSD (post-traumatic stress disorder)    Shoulder pain    Shoulder has been injected    Past Surgical History:  Procedure Laterality Date   APPENDECTOMY     Bladder polyps     CESAREAN SECTION WITH BILATERAL TUBAL LIGATION     CHOLECYSTECTOMY     COLONOSCOPY  May 2012   Dr. PPosey Pronto scattered diverticula pancolonic   COLONOSCOPY WITH PROPOFOL N/A 01/29/2018   Procedure: COLONOSCOPY WITH PROPOFOL;  Surgeon: RDaneil Dolin MD;  [facility];  Service: Endoscopy;  Laterality: N/A;   ESOPHAGOGASTRODUODENOSCOPY  April 2012   Dr. Posey Pronto: gastritis   ESOPHAGOGASTRODUODENOSCOPY N/A 08/09/2013   [id number] polyps/abnormal gastric because of uncertain significance-status post biopsy. Small hiatal PrismBlog.es chronic inflammation on bx. hyperplastic polyp   ESOPHAGOGASTRODUODENOSCOPY (EGD) WITH PROPOFOL N/A 01/29/2018   Procedure: ESOPHAGOGASTRODUODENOSCOPY (EGD) WITH PROPOFOL;  Surgeon: Daneil Dolin, MD;  [facility];  Service: Endoscopy;  Laterality: N/A;  1:30   MALONEY DILATION N/A 01/29/2018   Procedure: Venia Minks  DILATION;  Surgeon: Daneil Dolin, MD;  [facility];  Service: Endoscopy;  Laterality: N/A;   URETHRAL STRICTURE DILATATION     WRIST FRACTURE SURGERY Right     Current Medications: Outpatient Medications Prior to Visit  Medication Sig Dispense Refill   albuterol (VENTOLIN HFA) 108 (90 Base) MCG/ACT inhaler Inhale 2 puffs into the lungs every 6 (six) hours as needed for shortness of breath.     ALPRAZolam (XANAX) 1 MG tablet Take 1 mg by mouth 4 (four) times daily.     aspirin 81 MG EC tablet Take 81 mg by mouth daily as needed (for chest pain).     atorvastatin (LIPITOR) 10 MG tablet Take 1 tablet by mouth daily.     bisoprolol (ZEBETA) 10 MG tablet Take 1 tablet (10 mg total) by mouth daily. 90 tablet 3   cholecalciferol (VITAMIN D3) 25 MCG (1000 UNIT) tablet Take 1,000 Units by mouth daily.     fluticasone-salmeterol (ADVAIR DISKUS) 100-50 MCG/ACT AEPB Inhale 1 puff into the lungs 2 (two) times daily. 60 each 6   gabapentin (NEURONTIN) 400 MG capsule Take 400 mg by mouth 3 (three) times daily.     hydrocortisone 2.5 % ointment APPLY AS DIRECTED DAILY AS NEEDED FOR ITCHING. 28.35 g 2   lidocaine (LIDODERM) 5 % Place 1 patch onto the skin every 12 (twelve) hours.     mirabegron ER (MYRBETRIQ) 25 MG TB24 tablet Take 1 tablet (25 mg total) by mouth daily. 28 tablet 0   Multiple Vitamin (MULTI-DAY PO) Take by mouth.     nitroGLYCERIN (NITROSTAT) 0.4 MG SL tablet DISSOLVE 1 TABLET UNDER THE TONGUE EVERY 5 MINUTES AS NEEDED FOR CHEST PAIN. DO NOT EXCEED A TOTAL OF 3 DOSES IN 15 MINUTES. 25 tablet 1   oxyCODONE-acetaminophen (PERCOCET/ROXICET) 5-325 MG tablet Take 1 tablet by mouth 3 (three) times daily as needed.     pantoprazole (PROTONIX) 20 MG tablet Take 20 mg by mouth daily.     ranolazine (RANEXA) 500 MG 12 hr tablet Take 1 tablet (500 mg total) by mouth 2 (two) times daily. 180 tablet 3   RESTASIS 0.05 % ophthalmic emulsion Place 1 drop into both eyes 2 (two) times daily.      valACYclovir (VALTREX) 500 MG tablet Take 1 tablet by mouth daily.     vitamin C (ASCORBIC ACID) 500 MG tablet Take 500 mg by mouth daily.     amLODipine (NORVASC) 5 MG tablet Take 1 tablet (5 mg total) by mouth daily. 30 tablet 3   No facility-administered medications prior to visit.     Allergies:   Ciprofloxacin, Codeine, Ibuprofen, Symbicort [budesonide-formoterol fumarate], Erythromycin, Imdur [isosorbide nitrate], Mucinex [guaifenesin er], Nsaids, Roxicodone [oxycodone], and Shellfish allergy   Social History   Socioeconomic History   Marital status: Single    Spouse name: Not on file   Number of children: 1   Years of education: Not on file   Highest  education level: Not on file  Occupational History   Occupation: disabled  Tobacco Use   Smoking status: Every Day    Packs/day: 0.50    Years: 45.00    Total pack years: 22.50    Types: Cigarettes    Start date: 06/10/1974   Smokeless tobacco: Never  Vaping Use   Vaping Use: Never used  Substance and Sexual Activity   Alcohol use: No    Alcohol/week: 0.0 standard drinks of alcohol    Comment: Former user, heavy etoh use prior to 1990s.   Drug use: Not Currently    Types: Marijuana, Cocaine    Comment: 04/13/19-no cocaine; denies marijuana   Sexual activity: Not on file  Other Topics Concern   Not on file  Social History Narrative   ** Merged History Encounter **       Social Determinants of Health   Financial Resource Strain: Not on file  Food Insecurity: Not on file  Transportation Needs: Not on file  Physical Activity: Not on file  Stress: Not on file  Social Connections: Not on file     Family History:  The patient's family history includes Alcohol abuse in her brother, brother, and sister; Anxiety disorder in her daughter; Breast cancer in her paternal aunt; Clotting disorder in her sister; Colon cancer (age of onset: 32) in her paternal uncle; Coronary artery disease in her mother; Diabetes in her  brother, brother, and mother; Drug abuse in her brother, brother, and sister; Fibromyalgia in her daughter; Heart attack in her mother; Heart disease in her brother; Liver cancer in her sister; Obesity in her brother; Ovarian cancer in her maternal aunt; Stomach cancer (age of onset: 25) in her maternal aunt; Stomach cancer (age of onset: 64) in her paternal grandmother; Thyroid disease in her daughter.   Review of Systems:    Please see the history of present illness.     All other systems reviewed and are otherwise negative except as noted above.   Physical Exam:    VS:  BP (!) 144/78   Pulse (!) 53   Ht '4\' 10"'$  (1.473 m)   Wt 132 lb 12.8 oz (60.2 kg)   SpO2 98%   BMI 27.76 kg/m    General: Well developed, well nourished,female appearing in no acute distress. Head: Normocephalic, atraumatic. Neck: No carotid bruits. JVD not elevated.  Lungs: Respirations regular and unlabored, without wheezes or rales.  Heart: Regular rate and rhythm. No S3 or S4.  No murmur, no rubs, or gallops appreciated. Abdomen: Appears non-distended. No obvious abdominal masses. Msk:  Strength and tone appear normal for age. No obvious joint deformities or effusions. Extremities: No clubbing or cyanosis. No pitting edema.  Distal pedal pulses are 2+ bilaterally. Neuro: Alert and oriented X 3. Moves all extremities spontaneously. No focal deficits noted. Psych:  Responds to questions appropriately with a normal affect. Skin: No rashes or lesions noted  Wt Readings from Last 3 Encounters:  07/10/22 132 lb 12.8 oz (60.2 kg)  05/30/22 135 lb 12.8 oz (61.6 kg)  04/11/22 132 lb (59.9 kg)     Studies/Labs Reviewed:   EKG:  EKG is not ordered today.   Recent Labs: No results found for requested labs within last 365 days.   Lipid Panel No results found for: "CHOL", "TRIG", "HDL", "CHOLHDL", "VLDL", "LDLCALC", "LDLDIRECT"  Additional studies/ records that were reviewed today include:   Coronary CT:  05/2019 Coronary arteries: Normal coronary origins.  Right dominance.   Right  Coronary Artery: Mild mixed atherosclerotic plaque in the proximal RCA, 25-49% stenosis. Moderate mixed atherosclerotic plaque in the mid RCA, 50-69%. Severe mixed atherosclerotic plaque in the distal RCA, 70-99% stenosis. Patent PDA and PL with diffuse minimal plaque.   Left Main Coronary Artery: No detectable plaque or stenosis.   Left Anterior Descending Coronary Artery: Mild mixed atherosclerotic plaque in the proximal LAD, 25-49% stenosis. Moderate mixed atherosclerotic plaque in the mid LAD, 50-69%. Moderate mixed atherosclerotic plaque in the distal LAD 50-69%. Patent diagonal branches, mild mixed atherosclerotic plaque in the mid LAD at the takeoff of the first diagonal branch.   Left Circumflex Artery: Moderate mixed atherosclerotic plaque in the proximal L circumflex 50-69%. Possible severe atherosclerotic plaque in the proximal-distal L circumflex, 70-99%, at the bifurcation of medium caliber OM2. OM1 is small caliber but patent.   Aorta: Normal size, 26 mm at the mid ascending aorta (level of the PA bifurcation) measured double oblique. No calcifications. No dissection.   Aortic Valve: No calcifications.   Other findings:   Normal pulmonary vein drainage into the left atrium.   Normal left atrial appendage without a thrombus.   Normal size of the pulmonary artery.   IMPRESSION: 1. Severe CAD in the distal RCA and proximal-distal circumflex artery, CADRADS = 4. CT FFR analysis will be performed and reported separately.   2. The patient's coronary artery calcium score is 443, which places the patient in the 15 percentile for age and sex matched control.   3. Normal coronary origin with right dominance.   FINDINGS: FFRct analysis was performed on the original cardiac CT angiogram dataset. Diagrammatic representation of the FFRct analysis is provided in a separate PDF document in  PACS. This dictation was created using the PDF document and an interactive 3D model of the results. 3D model is not available in the EMR/PACS. Normal FFR range is >0.80.   1. Left Main:  No significant stenosis. FFR = 0.97   2. LAD: Proximal FFR = 0.95, Mid FFR = 0.81, Distal FFR = 0.73 3. LCX: Proximal FFR = 0.94, Distal FFR = 0.85, OM 2 = 0.84 4. RCA: Proximal FFR = 0.96, Mid FFR =0.87, Distal FFR = 0.84, PDA not mapped   IMPRESSION: 1. CT FFR analysis demonstrates possible hemodynamically significant lesion in the distal LAD. Vessel diameter just proximal to this lesion is 2.65 mm. Consider aggressive medical management for distal CAD.    Assessment:    1. Coronary artery disease involving native coronary artery of native heart with angina pectoris ([place])   2. Essential hypertension, benign   3. Mixed hyperlipidemia   4. Tobacco use      Plan:   In order of problems listed above:  1. CAD - She underwent Coronary CT in 04/2019 showing severe stenosis along the distal LAD and distal RCA with aggressive medical management recommended.  - Her episodes of exertional chest pain have resolved since medication titration but she continues to have sporadic episodes of pain underneath her left breast and has dyspnea on exertion in the setting of continued tobacco use.  - Given improvement in her symptoms and known small-vessel disease, I recommended we adjust her Amlodipine from '5mg'$  daily to '10mg'$  daily to help with angina and with her BP. Continue ASA '81mg'$  daily, Atorvastatin '10mg'$  daily, Bisoprolol '10mg'$  daily and Ranexa '500mg'$  BID. Previously intolerant to Imdur. If she continues to have discomfort, may need to consider a cardiac catheterization for definitive evaluation (previously declined NST) and this was reviewed  with the patient today. She is seeing several sub-specialists later this month including GI and Ortho so would make sure no additional procedures are planned prior to arranging  this.   2. HTN - BP was initially significantly elevated, rechecked and improved to 144/78. She was initially stressed upon arrival today as she had misplaced her wallet, however her BP has been elevated at other visits as well. Will titrate Amlodipine from '5mg'$  daily to '10mg'$  daily. Continue Bisoprolol at current dosing.   3. HLD - Overdue for repeat labs and we will request most recent labs from her PCP. LDL was at 76 on most recent available labs. Remains on Atorvastatin '10mg'$  daily.   4. Tobacco Use - She continues to smoke 1 ppd but is hopeful she can use nicotine patches to reduce her use.    She has previously scheduled follow-up in 6-7 week and will keep that appointment for reassessment of her symptoms.    Medication Adjustments/Labs and Tests Ordered: Current medicines are reviewed at length with the patient today.  Concerns regarding medicines are outlined above.  Medication changes, Labs and Tests ordered today are listed in the Patient Instructions below. Patient Instructions  Medication Instructions:   Increase Norvasc to 10 mg Daily   *If you need a refill on your cardiac medications before your next appointment, please call your pharmacy*   Lab Work: NONE   If you have labs (blood work) drawn today and your tests are completely normal, you will receive your results only by: [place] (if you have MyChart) OR A paper copy in the mail If you have any lab test that is abnormal or we need to change your treatment, we will call you to review the results.   Testing/Procedures: NONE    Follow-Up: At [facility], you and your health needs are our priority.  As part of our continuing mission to provide you with exceptional heart care, we have created designated Provider Care Teams.  These Care Teams include your primary Cardiologist (physician) and Advanced Practice Providers (APPs -  Physician Assistants and Nurse Practitioners) who all work together to provide  you with the care you need, when you need it.  We recommend signing up for the patient portal called "MyChart".  Sign up information is provided on this After Visit Summary.  MyChart is used to connect with patients for Virtual Visits (Telemedicine).  Patients are able to view lab/test results, encounter notes, upcoming appointments, etc.  Non-urgent messages can be sent to your provider as well.   To learn more about what you can do with MyChart, go to [email or web address].    Your next appointment:    October   The format for your next appointment:   In Person  Provider:   Rozann Lesches, MD    Other Instructions Thank you for choosing [facility]!    Important Information About Sugar         Signed, Erma Heritage, PA-C  07/10/2022 7:38 PM    [place] [address] [place], [place] [address] Phone: [phone] Fax: [phone]

## 2022-07-10 NOTE — Patient Instructions (Signed)
Medication Instructions:   Increase Norvasc to 10 mg Daily   *If you need a refill on your cardiac medications before your next appointment, please call your pharmacy*   Lab Work: NONE   If you have labs (blood work) drawn today and your tests are completely normal, you will receive your results only by: [place] (if you have MyChart) OR A paper copy in the mail If you have any lab test that is abnormal or we need to change your treatment, we will call you to review the results.   Testing/Procedures: NONE    Follow-Up: At [facility], you and your health needs are our priority.  As part of our continuing mission to provide you with exceptional heart care, we have created designated Provider Care Teams.  These Care Teams include your primary Cardiologist (physician) and Advanced Practice Providers (APPs -  Physician Assistants and Nurse Practitioners) who all work together to provide you with the care you need, when you need it.  We recommend signing up for the patient portal called "MyChart".  Sign up information is provided on this After Visit Summary.  MyChart is used to connect with patients for Virtual Visits (Telemedicine).  Patients are able to view lab/test results, encounter notes, upcoming appointments, etc.  Non-urgent messages can be sent to your provider as well.   To learn more about what you can do with MyChart, go to [email or web address].    Your next appointment:    October   The format for your next appointment:   In Person  Provider:   Rozann Lesches, MD    Other Instructions Thank you for choosing [facility]!    Important Information About Sugar

## 2022-08-14 ENCOUNTER — Telehealth: Payer: Self-pay | Admitting: Cardiology

## 2022-08-14 NOTE — Telephone Encounter (Signed)
Reports soreness and tenderness near left breast when walking rated 10/10. Denies dizziness or sob. Reports having sob sometimes and uses an inhaler. Denies active soreness or tenderness. Says she thinks it could be coming from her heart. Advised to keep visit scheduled already with Dr. Domenic Polite on 08/28/22 and she could discuss symptoms and he can decide if any testing is needed. Also advised if she develops worsening symptoms before then, to go to the ED for an evaluation. Verbalized understanding of plan.

## 2022-08-14 NOTE — Telephone Encounter (Signed)
Patient is requesting an order for an MRI.

## 2022-08-28 ENCOUNTER — Ambulatory Visit: Payer: Medicaid Other | Attending: Cardiology | Admitting: Cardiology

## 2022-08-28 ENCOUNTER — Encounter: Payer: Self-pay | Admitting: Cardiology

## 2022-08-28 VITALS — BP 118/70 | HR 54 | Ht <= 58 in | Wt 132.2 lb

## 2022-08-28 DIAGNOSIS — E782 Mixed hyperlipidemia: Secondary | ICD-10-CM

## 2022-08-28 DIAGNOSIS — I25119 Atherosclerotic heart disease of native coronary artery with unspecified angina pectoris: Secondary | ICD-10-CM | POA: Diagnosis not present

## 2022-08-28 DIAGNOSIS — I10 Essential (primary) hypertension: Secondary | ICD-10-CM | POA: Diagnosis not present

## 2022-08-28 NOTE — Patient Instructions (Addendum)
Medication Instructions:  Your physician recommends that you continue on your current medications as directed. Please refer to the Current Medication list given to you today. You can get polysporin ointment over the counter for your leg  Labwork: none  Testing/Procedures: none  Follow-Up: Your physician recommends that you schedule a follow-up appointment in: 6 months  Any Other Special Instructions Will Be Listed Below (If Applicable).  If you need a refill on your cardiac medications before your next appointment, please call your pharmacy.

## 2022-08-28 NOTE — Progress Notes (Signed)
Cardiology Office Note  Date: 08/28/2022   ID: Lilyonna, Steidle 1961/11/25, MRN [id number]  PCP:  Leeanne Rio, MD  Cardiologist:  Rozann Lesches, MD Electrophysiologist:  None   Chief Complaint  Patient presents with   Cardiac follow-up    History of Present Illness: Victoria Townsend is a 61 y.o. female last seen in August by Ms. Strader PA-C, I reviewed the note.  She is here today for a follow-up visit.  She reports recurring abdominal discomfort and history of a "hernia."  States that she will be seeing a pain management specialist in [place] soon about this.  Also a feeling of "needles" under her left breast, this has been a recurring symptom not typical for angina.  She states that she had an mammogram recently that was reassuring.  We went over her medications today.  From a cardiac perspective she is on aspirin, Norvasc, bisoprolol, Lipitor, and as needed nitroglycerin.  She did not tolerate Imdur or Ranexa previously.  We discussed her cardiac history.  She does have CAD based on coronary CTA in 2020, however main issue was distal disease within the LAD in a small caliber portion of the vessel that would be best managed medically.  Based on FFR analysis she had no other obstructive stenoses.  I personally reviewed her ECG today which shows sinus bradycardia.  Past Medical History:  Diagnosis Date   Agoraphobia    Anxiety disorder    Arthritis    Asthma    Bladder polyps    CAD (coronary artery disease)    Cardiac CTA June 2020   Colon polyps    COPD (chronic obstructive pulmonary disease) (HCC)    Depression    Emphysema lung (HCC)    Essential hypertension    Fibromyalgia    Gallstones    GERD (gastroesophageal reflux disease)    Herpes    Irregular heartbeat    Nephrolithiasis    Polysubstance abuse (HCC)    History of cocaine, alcohol, marijuana in the past   PTSD (post-traumatic stress disorder)    Shoulder pain    Shoulder has been injected     Past Surgical History:  Procedure Laterality Date   APPENDECTOMY     Bladder polyps     CESAREAN SECTION WITH BILATERAL TUBAL LIGATION     CHOLECYSTECTOMY     COLONOSCOPY  May 2012   Dr. Posey Pronto: scattered diverticula pancolonic   COLONOSCOPY WITH PROPOFOL N/A 01/29/2018   Procedure: COLONOSCOPY WITH PROPOFOL;  Surgeon: Daneil Dolin, MD;  [facility];  Service: Endoscopy;  Laterality: N/A;   ESOPHAGOGASTRODUODENOSCOPY  April 2012   Dr. Posey Pronto: gastritis   ESOPHAGOGASTRODUODENOSCOPY N/A 08/09/2013   DDU:KGURKYH polyps/abnormal gastric because of uncertain significance-status post biopsy. Small hiatal PrismBlog.es chronic inflammation on bx. hyperplastic polyp   ESOPHAGOGASTRODUODENOSCOPY (EGD) WITH PROPOFOL N/A 01/29/2018   Procedure: ESOPHAGOGASTRODUODENOSCOPY (EGD) WITH PROPOFOL;  Surgeon: Daneil Dolin, MD;  Location: [facility];  Service: Endoscopy;  Laterality: N/A;  1:30   MALONEY DILATION N/A 01/29/2018   Procedure: Venia Minks DILATION;  Surgeon: Daneil Dolin, MD;  Location: [facility];  Service: Endoscopy;  Laterality: N/A;   URETHRAL STRICTURE DILATATION     WRIST FRACTURE SURGERY Right     Current Outpatient Medications  Medication Sig Dispense Refill   albuterol (VENTOLIN HFA) 108 (90 Base) MCG/ACT inhaler Inhale 2 puffs into the lungs every 6 (six) hours as needed for shortness of breath.  ALPRAZolam (XANAX) 1 MG tablet Take 1 mg by mouth 3 (three) times daily.     amLODipine (NORVASC) 10 MG tablet Take 1 tablet (10 mg total) by mouth daily. 90 tablet 3   aspirin 81 MG EC tablet Take 81 mg by mouth daily.     atorvastatin (LIPITOR) 10 MG tablet Take 1 tablet by mouth daily.     bisoprolol (ZEBETA) 10 MG tablet Take 1 tablet (10 mg total) by mouth daily. 90 tablet 3   cholecalciferol (VITAMIN D3) 25 MCG (1000 UNIT) tablet Take 1,000 Units by mouth daily.     gabapentin (NEURONTIN) 400 MG capsule Take 400 mg by mouth 3 (three) times daily.      hydrocortisone 2.5 % ointment APPLY AS DIRECTED DAILY AS NEEDED FOR ITCHING. 28.35 g 2   lidocaine (LIDODERM) 5 % Place 1 patch onto the skin every 12 (twelve) hours.     Multiple Vitamin (MULTI-DAY PO) Take by mouth daily.     nitroGLYCERIN (NITROSTAT) 0.4 MG SL tablet DISSOLVE 1 TABLET UNDER THE TONGUE EVERY 5 MINUTES AS NEEDED FOR CHEST PAIN. DO NOT EXCEED A TOTAL OF 3 DOSES IN 15 MINUTES. 25 tablet 1   oxyCODONE-acetaminophen (PERCOCET/ROXICET) 5-325 MG tablet Take 1 tablet by mouth 3 (three) times daily as needed.     pantoprazole (PROTONIX) 20 MG tablet Take 20 mg by mouth 2 (two) times daily before a meal.     RESTASIS 0.05 % ophthalmic emulsion Place 1 drop into both eyes 2 (two) times daily.     valACYclovir (VALTREX) 500 MG tablet Take 1 tablet by mouth daily.     vitamin C (ASCORBIC ACID) 500 MG tablet Take 500 mg by mouth daily.     No current facility-administered medications for this visit.   Allergies:  Ciprofloxacin, Codeine, Ibuprofen, Symbicort [budesonide-formoterol fumarate], Erythromycin, Imdur [isosorbide nitrate], Mucinex [guaifenesin er], Nsaids, Roxicodone [oxycodone], and Shellfish allergy   ROS:  Chronic recurring abdominal pain. Left leg superficial wound (dropped box on it).  Physical Exam: VS:  BP 118/70   Pulse (!) 54   Ht '4\' 10"'$  (1.473 m)   Wt 132 lb 3.2 oz (60 kg)   SpO2 97%   BMI 27.63 kg/m , BMI Body mass index is 27.63 kg/m.  Wt Readings from Last 3 Encounters:  08/28/22 132 lb 3.2 oz (60 kg)  07/10/22 132 lb 12.8 oz (60.2 kg)  05/30/22 135 lb 12.8 oz (61.6 kg)    General: Patient appears comfortable at rest. HEENT: Conjunctiva and lids normal. Neck: Supple, no elevated JVP or carotid bruits. Lungs: Clear to auscultation, nonlabored breathing at rest. Cardiac: Regular rate and rhythm, no S3 or significant systolic murmur. Abdomen: Soft, bowel sounds present. Extremities: Superficial wound left lower leg (patient dropped a box on it).  No  drainage or surrounding erythema..  ECG:  An ECG dated 08/03/2021 was personally reviewed today and demonstrated:  Sinus bradycardia.  Recent Labwork:  October 2022: Hemoglobin 13.5, platelets 297, BUN 10, creatinine 0.71, potassium 4.28 April 2022: Hemoglobin A1c 5.9%  Other Studies Reviewed Today:  Cardiac CTA 05/20/2019: FINDINGS: Coronary calcium score: The patient's coronary artery calcium score is 443, which places the patient in the 99 percentile.   Coronary arteries: Normal coronary origins.  Right dominance.   Right Coronary Artery: Mild mixed atherosclerotic plaque in the proximal RCA, 25-49% stenosis. Moderate mixed atherosclerotic plaque in the mid RCA, 50-69%. Severe mixed atherosclerotic plaque in the distal RCA, 70-99% stenosis. Patent PDA and PL with  diffuse minimal plaque.   Left Main Coronary Artery: No detectable plaque or stenosis.   Left Anterior Descending Coronary Artery: Mild mixed atherosclerotic plaque in the proximal LAD, 25-49% stenosis. Moderate mixed atherosclerotic plaque in the mid LAD, 50-69%. Moderate mixed atherosclerotic plaque in the distal LAD 50-69%. Patent diagonal branches, mild mixed atherosclerotic plaque in the mid LAD at the takeoff of the first diagonal branch.   Left Circumflex Artery: Moderate mixed atherosclerotic plaque in the proximal L circumflex 50-69%. Possible severe atherosclerotic plaque in the proximal-distal L circumflex, 70-99%, at the bifurcation of medium caliber OM2. OM1 is small caliber but patent.   Aorta: Normal size, 26 mm at the mid ascending aorta (level of the PA bifurcation) measured double oblique. No calcifications. No dissection.   Aortic Valve: No calcifications.   Other findings:   Normal pulmonary vein drainage into the left atrium.   Normal left atrial appendage without a thrombus.   Normal size of the pulmonary artery.   IMPRESSION: 1. Severe CAD in the distal RCA and proximal-distal  circumflex artery, CADRADS = 4. CT FFR analysis will be performed and reported separately.   2. The patient's coronary artery calcium score is 443, which places the patient in the 99 percentile for age and sex matched control.   3. Normal coronary origin with right dominance.   Cardiac CT FFR 05/20/2019: 1. Left Main:  No significant stenosis. FFR = 0.97   2. LAD: Proximal FFR = 0.95, Mid FFR = 0.81, Distal FFR = 0.73 3. LCX: Proximal FFR = 0.94, Distal FFR = 0.85, OM 2 = 0.84 4. RCA: Proximal FFR = 0.96, Mid FFR =0.87, Distal FFR = 0.84, PDA not mapped  IMPRESSION: 1. CT FFR analysis demonstrates possible hemodynamically significant lesion in the distal LAD. Vessel diameter just proximal to this lesion is 2.65 mm. Consider aggressive medical management for distal CAD.  Carotid Dopplers 08/30/2021: IMPRESSION: Minimal amount of bilateral atherosclerotic plaque, right greater than left, not resulting in a hemodynamically significant stenosis within either internal carotid artery.  Assessment and Plan:  1.  CAD by coronary CTA in 2020.  Most significant issue is distal LAD disease in small caliber vessel that would be best managed medically.  FFR analysis did not indicate any other obstructive stenoses.  ECG is reviewed today.  I would recommend continuing medical therapy and risk factor modification.  She is currently on aspirin, bisoprolol, Norvasc, Lipitor, and as needed nitroglycerin.  No changes were made today.  2.  Essential hypertension, blood pressure is well controlled today.  Continue Norvasc and bisoprolol.  3.  Chronic recurring abdominal pain.  She states that she has visit with a pain management specialist in [place] pending.  4.  Superficial wound left leg (patient dropped a box on it).  Does not appear infected.  She has been pouring alcohol on it.  I recommended that she stop putting alcohol directly on the wound, treat with Polysporin and keep it clean.  If this  begins to drain or develop worsening surrounding erythema she should see her PCP.  Medication Adjustments/Labs and Tests Ordered: Current medicines are reviewed at length with the patient today.  Concerns regarding medicines are outlined above.   Tests Ordered: Orders Placed This Encounter  Procedures   EKG 12-Lead    Medication Changes: No orders of the defined types were placed in this encounter.   Disposition:  Follow up  6 months.  Signed, Satira Sark, MD, [facility] 08/28/2022 4:15 PM  [facility] [place], [address], [place] [address] Phone: [phone]; Fax: [phone]

## 2022-08-29 ENCOUNTER — Telehealth: Payer: Self-pay | Admitting: Pulmonary Disease

## 2022-08-29 NOTE — Telephone Encounter (Signed)
Please schedule ROV tomorrow afternoon in [place] office.

## 2022-08-29 NOTE — Telephone Encounter (Signed)
Called and spoke to patient.

## 2022-08-29 NOTE — Telephone Encounter (Signed)
Called and spoke to patient.  She is having a "stinging" pain in both of her lungs, she states she also has pain between her shoulder blades. Coughing up yellow and green mucus but states this has always occurred and is not a new concern. She wants Dr. Halford Chessman to approve a MRI for her to have both of her lungs checked out. She states she does not feel X Rays and Cts are sufficient enough to find out what is wrong with her lungs. She states she has a lot of anxiety about her lungs. Please advise

## 2022-08-29 NOTE — Telephone Encounter (Signed)
Called and spoke to patient. She uses [facility] and cannot come in tomorrow afternoon.  Offered patient a Radiographer, therapeutic visit and she declines.   She asked for an appointment the next time Dr. Halford Chessman is in [facility] office. Scheduled patient for an ov for November 13 pm. Patient refuses sooner appt in [place] and states she wants the MRI asap. Routing to Dr. Halford Chessman so he is aware of pts upcoming appt since she is a add on.

## 2022-08-30 ENCOUNTER — Other Ambulatory Visit: Payer: Self-pay | Admitting: Cardiology

## 2022-09-10 ENCOUNTER — Telehealth: Payer: Self-pay | Admitting: Pulmonary Disease

## 2022-09-10 DIAGNOSIS — R0609 Other forms of dyspnea: Secondary | ICD-10-CM

## 2022-09-10 NOTE — Telephone Encounter (Signed)
ATC patient back and phone went straight to vm, vm is full.

## 2022-09-10 NOTE — Telephone Encounter (Signed)
Called and spoke to patient and she states she wants to know if she can have an MRI done.  I advised her that during her appt on November 13th, her and Dr. Halford Chessman can discuss then and decide if any further testing is needed. Patient voiced understanding. Patient reiterates multiple times during phone call that she wants an MRI to check her lungs and it "feels like she has a toothache in her lungs" and she does not want to talk about inhalers during ov. She states this is the same pain we discussed during encounter on 08/29/22 and states nothing has changed. Patient is very persistent about getting an MRI done. Notified patient that she can further discuss with Dr. Halford Chessman on 10-07-22 at [facility].   A note has already been sent to Dr. Halford Chessman on 10/5 but will send this as an FYI for patients upcoming appt. Nothing further needed for now

## 2022-09-10 NOTE — Telephone Encounter (Signed)
Pt states she is having pain in her lung similar to a "poo pain." Sounds like this is related to encounter from 10/5. She already has an upcoming OV for November 13, but she want an MRI as well as she is very concerned about this pain. Please speak with pt as she was rambling and not making much sense.

## 2022-09-15 NOTE — Telephone Encounter (Signed)
Please let her know I have ordered an MRI of her chest pending insurance approval.

## 2022-09-16 NOTE — Telephone Encounter (Signed)
Called and notified patient.  She voiced understanding and is aware insurance will need to approve it before we can schedule it for her. Nothing further needed.

## 2022-10-07 ENCOUNTER — Ambulatory Visit: Payer: Medicaid Other | Admitting: Pulmonary Disease

## 2022-10-07 ENCOUNTER — Telehealth: Payer: Self-pay | Admitting: Pulmonary Disease

## 2022-10-07 ENCOUNTER — Telehealth: Payer: Self-pay | Admitting: Cardiology

## 2022-10-07 NOTE — Telephone Encounter (Signed)
Transferred patient  to Central scheduling

## 2022-10-07 NOTE — Telephone Encounter (Signed)
Patient said that she is concerned about her blood pressure. Her last two readings was around 155/77 even though she was put on new medication. Please call back to discuss

## 2022-10-07 NOTE — Telephone Encounter (Signed)
Reports noticing BP being elevated on 10/03/22 at orthopedic doctor office. Says it was 155/79. Reports BP was checked as soon as she sat down. The second BP check was still high but she doesn't remember the numbers. Does not check BP at home and does not have a home monitor. Advised that we need more consistent BP readings to determine if she need medication adjustments. Advised that she need to get a BP monitor to check her BP 's at home. Says she doesn't have a home BP monitor. Advised that our office could assist her with a new monitor. BP monitor made available for pick up at up coming visit. Nurse visit arranged for patient on 10/29/2022 '@10'$ :00 am. Request appointment in December due to transportation issues. Verbalized understanding of plan.

## 2022-10-10 ENCOUNTER — Ambulatory Visit (HOSPITAL_COMMUNITY): Admission: RE | Admit: 2022-10-10 | Payer: Medicaid Other | Source: Ambulatory Visit

## 2022-10-22 ENCOUNTER — Ambulatory Visit (HOSPITAL_COMMUNITY)
Admission: RE | Admit: 2022-10-22 | Discharge: 2022-10-22 | Disposition: A | Discharge status: Home / Self Care | Payer: Medicaid Other | Source: Ambulatory Visit | Attending: Pulmonary Disease | Admitting: Pulmonary Disease

## 2022-10-22 DIAGNOSIS — R0609 Other forms of dyspnea: Secondary | ICD-10-CM | POA: Diagnosis present

## 2022-10-22 MED ORDER — GADOBUTROL 1 MMOL/ML IV SOLN
7.0000 mL | Freq: Once | INTRAVENOUS | Status: AC | PRN
  Administered 2022-10-22: 10 mL via INTRAVENOUS

## 2022-10-24 ENCOUNTER — Telehealth: Payer: Self-pay | Admitting: Cardiology

## 2022-10-24 NOTE — Telephone Encounter (Signed)
Patient informed that nurse visit appointment has been canceled. Advised that a BP monitor is here at the office and she can pick it up when she can. Verbalized understanding.

## 2022-10-24 NOTE — Telephone Encounter (Signed)
Appointment cancelled for 12/5, per patient request.

## 2022-10-24 NOTE — Telephone Encounter (Signed)
Patient stated she had her BP take on 11/27 at [facility] and the reading was 140/80.  Patient stated she wants to cancel the appointment on 12/5 at 10am.  Patient stated she does not have a blood pressure cuff as yet.

## 2022-10-29 ENCOUNTER — Ambulatory Visit: Payer: Medicaid Other

## 2022-11-08 ENCOUNTER — Other Ambulatory Visit: Payer: Self-pay | Admitting: Cardiology

## 2022-11-29 ENCOUNTER — Telehealth: Payer: Self-pay | Admitting: Cardiology

## 2022-11-29 NOTE — Telephone Encounter (Signed)
Noted Will review at upcoming visit

## 2022-11-29 NOTE — Telephone Encounter (Signed)
New Message:     Patient wanted you to know that she have  not been able to use the Blood Pressure [place]. The charger does not have the plug in part.

## 2022-12-02 ENCOUNTER — Telehealth: Payer: Self-pay | Admitting: Cardiology

## 2022-12-02 NOTE — Telephone Encounter (Signed)
Says her BP is still elevated when she goes to other doctor appointments. Says she has not been able to use her BP monitor due to it not having a charging cord. Gave visit to see Victoria Townsend on 12/06/22 and advised to bring home BP monitor to visit on 12/06/2022. Verbalized understanding.

## 2022-12-02 NOTE — Telephone Encounter (Signed)
Follow Up:     Patient is calling about her blood pressure Monitor. She wants to know if you have a plug or charger?

## 2022-12-06 ENCOUNTER — Other Ambulatory Visit: Payer: Self-pay

## 2022-12-06 ENCOUNTER — Ambulatory Visit: Payer: Medicaid Other | Attending: Nurse Practitioner | Admitting: Nurse Practitioner

## 2022-12-06 ENCOUNTER — Telehealth: Payer: Self-pay | Admitting: Cardiology

## 2022-12-06 ENCOUNTER — Encounter: Payer: Self-pay | Admitting: Nurse Practitioner

## 2022-12-06 VITALS — BP 148/72 | HR 59 | Ht <= 58 in | Wt 131.6 lb

## 2022-12-06 DIAGNOSIS — Z122 Encounter for screening for malignant neoplasm of respiratory organs: Secondary | ICD-10-CM

## 2022-12-06 DIAGNOSIS — Z87891 Personal history of nicotine dependence: Secondary | ICD-10-CM

## 2022-12-06 DIAGNOSIS — R0609 Other forms of dyspnea: Secondary | ICD-10-CM

## 2022-12-06 DIAGNOSIS — E785 Hyperlipidemia, unspecified: Secondary | ICD-10-CM

## 2022-12-06 DIAGNOSIS — I10 Essential (primary) hypertension: Secondary | ICD-10-CM | POA: Diagnosis not present

## 2022-12-06 DIAGNOSIS — R079 Chest pain, unspecified: Secondary | ICD-10-CM | POA: Diagnosis not present

## 2022-12-06 DIAGNOSIS — R001 Bradycardia, unspecified: Secondary | ICD-10-CM

## 2022-12-06 DIAGNOSIS — J449 Chronic obstructive pulmonary disease, unspecified: Secondary | ICD-10-CM

## 2022-12-06 DIAGNOSIS — I25119 Atherosclerotic heart disease of native coronary artery with unspecified angina pectoris: Secondary | ICD-10-CM | POA: Diagnosis not present

## 2022-12-06 DIAGNOSIS — R600 Localized edema: Secondary | ICD-10-CM

## 2022-12-06 MED ORDER — ISOSORBIDE MONONITRATE ER 30 MG PO TB24: 30.0000 mg | ORAL_TABLET | Freq: Every day | ORAL | 3 refills | Status: DC

## 2022-12-06 NOTE — Progress Notes (Unsigned)
Cardiology Office Note:    Date:  12/06/2022  ID:  Victoria Townsend, DOB 07-26-1961, MRN [id number]  PCP:  Leeanne Rio, MD   Victoria Townsend Providers Cardiologist:  Rozann Lesches, MD     Referring MD: Leeanne Rio, MD   CC: Here for follow-up  History of Present Illness:    Victoria Townsend is a 62 y.o. female with a hx of the following:  CAD Hypertension Hyperlipidemia Tobacco use Anxiety, PTSD COPD  In 2020, had coronary CTA performed that revealed severe stenosis along distal LAD and distal RCA with aggressive medical management recommended.  Patient is a 62 year old female with past medical history as mentioned above.  In May 2023 she noted numbness along left side of face, intermittent tightness along her back and chest.  Evaluated by EMS, BP elevated.  Patient reported at that time exposure to black mold in her home and thought this was contributing to her symptoms.  Verapamil discontinued, started on bisoprolol 10 mg daily and was restarted on Ranexa 500 mg twice daily, previously tolerant to Imdur.  Exercise Myoview was recommended for further evaluation, however this was not performed.  She was ultimately started on amlodipine 5 mg daily.  She did contact the office in the interim reporting chest pain that was recurrent, ED evaluation was suggested and recommended, but was not pursued.  At follow-up with Bernerd Pho, PA-C in August 2023, Norvasc was increased to 10 mg daily for exertional chest pain.  It was suggested that she continues to have chest pain in the future, will need to consider cardiac catheterization for definitive evaluation as she previously declined NST.  Was smoking 1 pack/day.  Last seen by Dr. Domenic Polite on August 28, 2022.  Dr. Domenic Polite reviewed her medications and noted that she did not tolerate Imdur or Ranexa in the past.  Dr. Domenic Polite recommended continuing medical therapy and risk factor modification for CAD.  Was told to follow-up  in 6 months.  Today she presents for follow-up.  She contacted our office this past week saying her blood pressure was elevated.  She presents today for follow-up evaluation for her blood pressure.  CC is chest pain under bilateral breast tissues, "stinging like a bee." Noticed for past couple of weeks, stable over time, and more short of breath, has been taking NG intermittently. Pt is a difficult historian and unable to tell what makes CP better or worse.  Denies any palpitations, syncope, presyncope, dizziness, orthopnea, PND, swelling or significant weight changes, acute bleeding, or claudication. She admits to recent stress, denies any SI or HI.  Denies any other questions or concerns today.  Past Medical History:  Diagnosis Date   Agoraphobia    Anxiety disorder    Arthritis    Asthma    Bladder polyps    CAD (coronary artery disease)    Cardiac CTA June 2020   Colon polyps    COPD (chronic obstructive pulmonary disease) (HCC)    Depression    Emphysema lung (HCC)    Essential hypertension    Fibromyalgia    Gallstones    GERD (gastroesophageal reflux disease)    Herpes    Irregular heartbeat    Nephrolithiasis    Polysubstance abuse (HCC)    History of cocaine, alcohol, marijuana in the past   PTSD (post-traumatic stress disorder)    Shoulder pain    Shoulder has been injected    Past Surgical History:  Procedure Laterality Date   APPENDECTOMY  Bladder polyps     CESAREAN SECTION WITH BILATERAL TUBAL LIGATION     CHOLECYSTECTOMY     COLONOSCOPY  May 2012   Dr. Posey Pronto: scattered diverticula pancolonic   COLONOSCOPY WITH PROPOFOL N/A 01/29/2018   Procedure: COLONOSCOPY WITH PROPOFOL;  Surgeon: Daneil Dolin, MD;  [facility]: [facility];  Service: Endoscopy;  Laterality: N/A;   ESOPHAGOGASTRODUODENOSCOPY  April 2012   Dr. Posey Pronto: gastritis   ESOPHAGOGASTRODUODENOSCOPY N/A 08/09/2013   NOB:[id number] polyps/abnormal gastric because of uncertain significance-status post  biopsy. Small hiatal PrismBlog.es chronic inflammation on bx. hyperplastic polyp   ESOPHAGOGASTRODUODENOSCOPY (EGD) WITH PROPOFOL N/A 01/29/2018   Procedure: ESOPHAGOGASTRODUODENOSCOPY (EGD) WITH PROPOFOL;  Surgeon: Daneil Dolin, MD;  Location: [facility];  Service: Endoscopy;  Laterality: N/A;  1:30   MALONEY DILATION N/A 01/29/2018   Procedure: Venia Minks DILATION;  Surgeon: Daneil Dolin, MD;  Location: [facility];  Service: Endoscopy;  Laterality: N/A;   URETHRAL STRICTURE DILATATION     WRIST FRACTURE SURGERY Right     Current Medications: Current Meds  Medication Sig   albuterol (VENTOLIN HFA) 108 (90 Base) MCG/ACT inhaler Inhale 2 puffs into the lungs every 6 (six) hours as needed for shortness of breath.   ALPRAZolam (XANAX) 1 MG tablet Take 1 mg by mouth 3 (three) times daily.   amLODipine (NORVASC) 10 MG tablet Take 1 tablet (10 mg total) by mouth daily.   aspirin 81 MG EC tablet Take 81 mg by mouth daily.   atorvastatin (LIPITOR) 10 MG tablet Take 1 tablet by mouth daily.   gabapentin (NEURONTIN) 400 MG capsule Take 400 mg by mouth 3 (three) times daily.   hydrocortisone 2.5 % ointment APPLY AS DIRECTED DAILY AS NEEDED FOR ITCHING.   lidocaine (LIDODERM) 5 % Place 1 patch onto the skin every 12 (twelve) hours.   Multiple Vitamin (MULTI-DAY PO) Take by mouth daily.   nitroGLYCERIN (NITROSTAT) 0.4 MG SL tablet DISSOLVE 1 TABLET UNDER THE TONGUE EVERY 5 MINUTES AS NEEDED FOR CHEST PAIN. DO NOT EXCEED A TOTAL OF 3 DOSES IN 15 MINUTES.   oxyCODONE-acetaminophen (PERCOCET/ROXICET) 5-325 MG tablet Take 1 tablet by mouth 3 (three) times daily as needed.   pantoprazole (PROTONIX) 20 MG tablet Take 20 mg by mouth 2 (two) times daily before a meal.   RESTASIS 0.05 % ophthalmic emulsion Place 1 drop into both eyes 2 (two) times daily.   valACYclovir (VALTREX) 500 MG tablet Take 1 tablet by mouth daily.   vitamin C (ASCORBIC ACID) 500 MG tablet Take 500 mg by mouth daily.    bisoprolol  (ZEBETA) 10 MG tablet Take 1 tablet (10 mg total) by mouth daily.   [DISCONTINUED] isosorbide mononitrate (IMDUR) 30 MG 24 hr tablet Take 1 tablet (30 mg total) by mouth daily.     Allergies:   Ciprofloxacin, Codeine, Ibuprofen, Ranexa [ranolazine], Symbicort [budesonide-formoterol fumarate], Erythromycin, Imdur [isosorbide nitrate], Mucinex [guaifenesin er], Nsaids, Roxicodone [oxycodone], and Shellfish allergy   Social History   Socioeconomic History   Marital status: Single    Spouse name: Not on file   Number of children: 1   Years of education: Not on file   Highest education level: Not on file  Occupational History   Occupation: disabled  Tobacco Use   Smoking status: Every Day    Packs/day: 0.50    Years: 45.00    Total pack years: 22.50    Types: Cigarettes    Start date: 06/10/1974   Smokeless tobacco: Never  Vaping  Use   Vaping Use: Never used  Substance and Sexual Activity   Alcohol use: No    Alcohol/week: 0.0 standard drinks of alcohol    Comment: Former user, heavy etoh use prior to 1990s.   Drug use: Not Currently    Types: Marijuana, Cocaine    Comment: 04/13/19-no cocaine; denies marijuana   Sexual activity: Not on file  Other Topics Concern   Not on file  Social History Narrative   ** Merged History Encounter **       Social Determinants of Health   Financial Resource Strain: Not on file  Food Insecurity: Not on file  Transportation Needs: Not on file  Physical Activity: Not on file  Stress: Not on file  Social Connections: Not on file     Family History: The patient's family history includes Alcohol abuse in her brother, brother, and sister; Anxiety disorder in her daughter; Breast cancer in her paternal aunt; Clotting disorder in her sister; Colon cancer (age of onset: 42) in her paternal uncle; Coronary artery disease in her mother; Diabetes in her brother, brother, and mother; Drug abuse in her brother, brother, and sister; Fibromyalgia in her  daughter; Heart attack in her mother; Heart disease in her brother; Liver cancer in her sister; Obesity in her brother; Ovarian cancer in her maternal aunt; Stomach cancer (age of onset: 62) in her maternal aunt; Stomach cancer (age of onset: 20) in her paternal grandmother; Thyroid disease in her daughter.  ROS:   Review of Systems  Constitutional: Negative.   HENT: Negative.    Eyes: Negative.   Respiratory:  Positive for shortness of breath. Negative for cough, hemoptysis, sputum production and wheezing.   Cardiovascular:  Positive for chest pain and leg swelling. Negative for palpitations, orthopnea, claudication and PND.  Gastrointestinal: Negative.   Genitourinary: Negative.   Musculoskeletal: Negative.   Skin: Negative.   Neurological: Negative.   Endo/Heme/Allergies: Negative.   Psychiatric/Behavioral:  Positive for depression. Negative for hallucinations, memory loss, substance abuse and suicidal ideas. The patient is nervous/anxious. The patient does not have insomnia.     Please see the history of present illness.    All other systems reviewed and are negative.  EKGs/Labs/Other Studies Reviewed:    The following studies were reviewed today:   EKG:  EKG is ordered today.  The ekg ordered today demonstrates sinus bradycardia, 48 bpm, incomplete RBBB, otherwise nothing acute.   Bilateral carotid duplex on August 30, 2021: IMPRESSION: Minimal amount of bilateral atherosclerotic plaque, right greater than left, not resulting in a hemodynamically significant stenosis within either internal carotid artery.    Coronary CTA on May 20, 2019: IMPRESSION: 1. Severe CAD in the distal RCA and proximal-distal circumflex artery, CADRADS = 4. CT FFR analysis will be performed and reported separately.   2. The patient's coronary artery calcium score is 443, which places the patient in the 20 percentile for age and sex matched control.   3. Normal coronary origin with right  dominance.   Recent Labs: No results found for requested labs within last 365 days.  Recent Lipid Panel No results found for: "CHOL", "TRIG", "HDL", "CHOLHDL", "VLDL", "LDLCALC", "LDLDIRECT"    Physical Exam:    VS:  BP (!) 148/72   Pulse (!) 59   Ht '4\' 10"'$  (1.473 m)   Wt 131 lb 9.6 oz (59.7 kg)   SpO2 95%   BMI 27.50 kg/m     Wt Readings from Last 3 Encounters:  12/06/22 131 lb  9.6 oz (59.7 kg)  08/28/22 132 lb 3.2 oz (60 kg)  07/10/22 132 lb 12.8 oz (60.2 kg)     GEN: Well nourished, well developed in no acute distress HEENT: Normal NECK: No JVD; No carotid bruits CARDIAC: S1/S2, RRR, no murmurs, rubs, gallops; 2+ pulses RESPIRATORY:  Clear and diminished to auscultation without rales, wheezing or rhonchi  MUSCULOSKELETAL:  2+ pitting edema along BLE; No deformity  SKIN: Warm and dry NEUROLOGIC:  Alert and oriented x 3 PSYCHIATRIC:  Nervous, flat affect, tearful during interview  ASSESSMENT:    1. Coronary artery disease involving native heart with angina pectoris, unspecified vessel or lesion type ([place])   2. Chest pain of uncertain etiology   3. Hypertension, unspecified type   4. Bradycardia   5. Hyperlipidemia, unspecified hyperlipidemia type   6. Chronic obstructive pulmonary disease, unspecified COPD type ([place])   7. DOE (dyspnea on exertion)   8. Leg edema    PLAN:    In order of problems listed above:  CAD, chest pain of uncertain etiology Patient is a difficult historian. Does not some atypical presentation of chest pain.  Coronary CTA in 2020 revealed severe CAD in his RCA and proximal and distal circumflex artery, coronary artery calcium score was 443.  FFR analysis demonstrated possible hemodynamically significant lesion distal LAD.  Vessel diameter just proximal to the lesion was 2.65 mm.  It was recommended to consider aggressive medical management.  Was previously ordered NST in past, however she declined.  Was recommended to consider cardiac  catheterization for definitive evaluation in the future for chest pain. Case d/w Dr. Domenic Polite, who recommended medical management at this time.  Originally prescribed Imdur, however she is allergic, medication d/c.  She has allergies to several antianginals.  Will continue to watch and observe for the next few weeks, and if no improvement in symptoms in next few weeks, strongly consider cardiac catheterization for definitive evaluation.  Continue aspirin, atorvastatin, and nitroglycerin as needed.  Will discontinue bisoprolol as mentioned below. Heart healthy diet and regular cardiovascular exercise encouraged.   Hypertension, bradycardia BP on arrival 140/72, repeat BP 139/80.  Does admit to some elevated BP readings, does admit to recent stress. Discussed to monitor BP at home at least 2 hours after medications and sitting for 5-10 minutes.  She was bradycardic on EKG and heart rate on exam was 48 bpm. Asymptomatic with this HR. Discontinuing bisoprolol.  CMA spoke with patient and gave my verbal order to tell patient to monitor heart rate and blood pressure for the next 2 to 3 weeks and let me know her readings.  If heart rate and BP are stable, plan to initiate low-dose Toprol-XL. Heart healthy diet and regular cardiovascular exercise encouraged.   Hyperlipidemia No recent lipid panel on file.  Plan to discuss obtaining labs from PCP and fax over to our office.  Continue atorvastatin. Heart healthy diet and regular cardiovascular exercise encouraged.   COPD, DOE Does admit to more shortness of breath in the past few weeks.  Etiology multifactorial. Clear and diminished to auscultation on exam. Will arrange 2D echocardiogram at this time. Continue to follow with PCP.  5. Leg edema Does show me bilateral lower legs with +2 pitting edema bilaterally. Recommended at this time wearing compression stockings, low salt/heart healthy diet, and leg elevation. Think the shoes she was wearing is contributing to  this as well. Obtaining 2D echo. If no improvement by next OV, consider low dose Lasix PRN.  5. Disposition: Follow-up with me in 4-6 weeks or sooner if anything changes.   Medication Adjustments/Labs and Tests Ordered: Current medicines are reviewed at length with the patient today.  Concerns regarding medicines are outlined above.  Orders Placed This Encounter  Procedures   EKG 12-Lead   ECHOCARDIOGRAM COMPLETE   Meds ordered this encounter  Medications   DISCONTD: isosorbide mononitrate (IMDUR) 30 MG 24 hr tablet    Sig: Take 1 tablet (30 mg total) by mouth daily.    Dispense:  90 tablet    Refill:  3    Patient Instructions  Medication Instructions:  Your physician has recommended you make the following change in your medication:  - Stop Bisoprolol  - Start Imdur 30 mg tablets daily   Labwork: None  Testing/Procedures: Your physician has requested that you have an echocardiogram. Echocardiography is a painless test that uses sound waves to create images of your heart. It provides your doctor with information about the size and shape of your heart and how well your heart's chambers and valves are working. This procedure takes approximately one hour. There are no restrictions for this procedure. Please do NOT wear cologne, perfume, aftershave, or lotions (deodorant is allowed). Please arrive 15 minutes prior to your appointment time.   Follow-Up: Follow up with Finis Bud, NP in 4-6 weeks  Any Other Special Instructions Will Be Listed Below (If Applicable).     If you need a refill on your cardiac medications before your next appointment, please call your pharmacy.    Signed, Finis Bud, NP  12/09/2022 8:16 AM    [place]

## 2022-12-06 NOTE — Telephone Encounter (Signed)
Per Annita Brod, NP:  Patient will monitor bp for 2 weeks and call office to update on bp. May start pt on Dilt after we receive readings   Patient notified and verbalized understanding.

## 2022-12-06 NOTE — Patient Instructions (Signed)
Medication Instructions:  Your physician has recommended you make the following change in your medication:  - Stop Bisoprolol  - Start Imdur 30 mg tablets daily   Labwork: None  Testing/Procedures: Your physician has requested that you have an echocardiogram. Echocardiography is a painless test that uses sound waves to create images of your heart. It provides your doctor with information about the size and shape of your heart and how well your heart's chambers and valves are working. This procedure takes approximately one hour. There are no restrictions for this procedure. Please do NOT wear cologne, perfume, aftershave, or lotions (deodorant is allowed). Please arrive 15 minutes prior to your appointment time.   Follow-Up: Follow up with Finis Bud, NP in 4-6 weeks  Any Other Special Instructions Will Be Listed Below (If Applicable).     If you need a refill on your cardiac medications before your next appointment, please call your pharmacy.

## 2022-12-06 NOTE — Telephone Encounter (Signed)
Per patient she is also allergic to Ranexa- added to allergy list

## 2022-12-06 NOTE — Telephone Encounter (Signed)
Pt c/o medication issue:  1. Name of Medication: isosorbide mononitrate (IMDUR) 30 MG 24 hr tablet   2. How are you currently taking this medication (dosage and times per day)? Take 1 tablet (30 mg total) by mouth daily   3. Are you having a reaction (difficulty breathing--STAT)? no  4. What is your medication issue? Patient states she is allergic to this medication. She states that it gives her a headache. Please advise

## 2022-12-06 NOTE — Progress Notes (Signed)
LDCT order placed per protocol. Scan scheduled for 03/18 at 1245

## 2022-12-11 ENCOUNTER — Telehealth: Payer: Self-pay | Admitting: Cardiology

## 2022-12-11 DIAGNOSIS — R600 Localized edema: Secondary | ICD-10-CM

## 2022-12-11 DIAGNOSIS — I10 Essential (primary) hypertension: Secondary | ICD-10-CM

## 2022-12-11 NOTE — Telephone Encounter (Signed)
Says she still has some swelling in legs and wants to know if her medication is causing it. Advised that amlodipine can cause LE swelling. Says she has been checking her BP's at random times of the day and has not been checking it daily. 12/08/22 BP 127/67 & HR 52 & 12/09/22 BP 146/77. Encouraged to start checking BP daily, same arm, same machine at the same time of day, waiting 5-10 minutes after sitting. Also advised that the test mentioned that would need to be done in [place] would be discussed at her next visit. Verbalized understanding.

## 2022-12-11 NOTE — Telephone Encounter (Signed)
Pt c/o swelling: STAT is pt has developed SOB within 24 hours  If swelling, where is the swelling located? Right leg  How much weight have you gained and in what time span? Has not gained any weight that she knows of  Have you gained 3 pounds in a day or 5 pounds in a week? No   Do you have a log of your daily weights (if so, list)? No   Are you currently taking a fluid pill? Not that she knows of   Are you currently SOB? No   Have you traveled recently? No    Patient is calling stating she is having pain in her left leg from her hip down. She reports the leg has gotten a lot smaller since her appointment with elizabeth, but the right leg is still swollen the same. She is also wanting to discuss the procedure Benjamine Mola mentioned her having at [facility] to check for blockages due to her daughter telling her she'd take her to have it performed.

## 2022-12-12 ENCOUNTER — Telehealth: Payer: Self-pay | Admitting: Cardiology

## 2022-12-12 MED ORDER — FUROSEMIDE 20 MG PO TABS: 20.0000 mg | ORAL_TABLET | Freq: Every day | ORAL | 3 refills | Status: DC

## 2022-12-12 MED ORDER — MEDICAL COMPRESSION STOCKINGS MISC: 1.0000 | 0 refills | Status: AC

## 2022-12-12 NOTE — Telephone Encounter (Addendum)
Patient informed and verbalized understanding of plan. Compression stockings prescription mailed to home address Lab order sent [facility]

## 2022-12-12 NOTE — Addendum Note (Signed)
Addended by: Merlene Laughter on: 12/12/2022 01:59 PM   Modules accepted: Orders

## 2022-12-12 NOTE — Telephone Encounter (Signed)
Will forward to [place] who saw patient last for advice.

## 2022-12-12 NOTE — Addendum Note (Signed)
Addended by: Merlene Laughter on: 12/12/2022 11:38 AM   Modules accepted: Orders

## 2022-12-12 NOTE — Telephone Encounter (Signed)
Patient is calling stating she is not able to take her own BP no matter how she tries to do it the reading comes back as error. Please advise.

## 2022-12-12 NOTE — Telephone Encounter (Signed)
Advised to bring BP cuff to office on 12/23/22 when she has her echo to get assistance and instructions on how to use her cuff. Verbalized understanding of plan.

## 2022-12-12 NOTE — Telephone Encounter (Signed)
Patient believes this medication amLODipine (NORVASC) 10 MG tablet , is causing her leg to swell.  Patient states the medication is too dangerous to take. And patient wants something that doesn't have that side effect.. Please advise

## 2022-12-13 ENCOUNTER — Telehealth: Payer: Self-pay | Admitting: Nurse Practitioner

## 2022-12-13 ENCOUNTER — Telehealth: Payer: Self-pay | Admitting: Home Health

## 2022-12-13 ENCOUNTER — Other Ambulatory Visit: Payer: Self-pay | Admitting: Physician Assistant

## 2022-12-13 NOTE — Progress Notes (Unsigned)
Cardiology Office Note:    Date:  12/16/2022  ID:  Victoria Townsend, DOB October 16, 1961, MRN [id number]  PCP:  Luciano Cutter, [place] [facility] Cardiologist:  Rozann Lesches, MD     Referring MD: Luciano Cutter, DO   CC: Here for follow-up  History of Present Illness:    Victoria Townsend is a 62 y.o. female with a hx of the following:  CAD Hypertension Hyperlipidemia Tobacco use Anxiety, PTSD COPD  In 2020, had coronary CTA performed that revealed severe stenosis along distal LAD and distal RCA with aggressive medical management recommended.  Patient is a 62 year old female with past medical history as mentioned above.  In May 2023 she noted numbness along left side of face, intermittent tightness along her back and chest.  Evaluated by EMS, BP elevated.  Patient reported at that time exposure to black mold in her home and thought this was contributing to her symptoms.  Verapamil discontinued, started on bisoprolol 10 mg daily and was restarted on Ranexa 500 mg twice daily, previously tolerant to Imdur.  Exercise Myoview was recommended for further evaluation, however this was not performed.  She was ultimately started on amlodipine 5 mg daily.  She did contact the office in the interim reporting chest pain that was recurrent, ED evaluation was suggested and recommended, but was not pursued.  At follow-up with Bernerd Pho, PA-C in August 2023, Norvasc was increased to 10 mg daily for exertional chest pain.  It was suggested that she continues to have chest pain in the future, will need to consider cardiac catheterization for definitive evaluation as she previously declined NST.  Was smoking 1 pack/day.  Last seen by Dr. Domenic Polite on August 28, 2022.  Dr. Domenic Polite reviewed her medications and noted that she did not tolerate Imdur or Ranexa in the past.  Dr. Domenic Polite recommended continuing medical therapy and risk factor modification for CAD.  Was told to follow-up in 6  months.  I saw her for follow-up on 12/06/2022.  She presented today for follow-up evaluation for her blood pressure, however CC was chest pain under bilateral breast tissues, "stinging like a bee." Noticed for past couple of weeks, stable over time, and increased shortness of breath, was taking NG intermittently. Was unable to tell what made CP better or worse.  Denied any palpitations, syncope, presyncope, dizziness, orthopnea, PND, swelling or significant weight changes, acute bleeding, or claudication. She admitted to recent stress, denied any SI or HI. Bisoprolol was d/c d/t bradycardia. Found to be intolerant to Ranexa and Imdur. Due to atypical symptoms and no acute ischemic changes on EKG, would monitor symptoms and further evaluate need for cardiac cath at future follow-up.   Soon after office visit with me, she contacted our office noting leg edema. Was prescribed compression stockings and Lasix 20 mg daily since kidney function was WNL. She called back to office noting researching Lasix that "I saw it leads to death," and "I need one with less side effects, it makes me pee a lot." She called back and stated she was not going to take the medication anymore. Was made an office visit for further discussion of her medications.   Today she presents for follow-up of her medications. She states she cannot tolerate Lasix as this caused, "headaches, pukes." Says she cannot tolerate the fact that Lasix makes her go to the bathroom. BP is elevated. Patient is a difficult historian. Continues to note atypical chest pain, thinks this is a symptom  from having Covid. Denies any shortness of breath, palpitations, syncope, presyncope, dizziness, orthopnea, PND, significant weight changes, acute bleeding, or claudication. Does have stable leg edema, has stopped taking Amlodipine as she cannot tolerate the fact that this has caused her legs to swell. Denies any other questions or concerns.   Past Medical History:   Diagnosis Date   Agoraphobia    Anxiety disorder    Arthritis    Asthma    Bladder polyps    CAD (coronary artery disease)    Cardiac CTA June 2020   Colon polyps    COPD (chronic obstructive pulmonary disease) (HCC)    Depression    Emphysema lung (HCC)    Essential hypertension    Fibromyalgia    Gallstones    GERD (gastroesophageal reflux disease)    Herpes    Irregular heartbeat    Nephrolithiasis    Polysubstance abuse (HCC)    History of cocaine, alcohol, marijuana in the past   PTSD (post-traumatic stress disorder)    Shoulder pain    Shoulder has been injected    Past Surgical History:  Procedure Laterality Date   APPENDECTOMY     Bladder polyps     CESAREAN SECTION WITH BILATERAL TUBAL LIGATION     CHOLECYSTECTOMY     COLONOSCOPY  May 2012   Dr. Posey Pronto: scattered diverticula pancolonic   COLONOSCOPY WITH PROPOFOL N/A 01/29/2018   Procedure: COLONOSCOPY WITH PROPOFOL;  Surgeon: Daneil Dolin, MD;  [facility]: [facility];  Service: Endoscopy;  Laterality: N/A;   ESOPHAGOGASTRODUODENOSCOPY  April 2012   Dr. Posey Pronto: gastritis   ESOPHAGOGASTRODUODENOSCOPY N/A 08/09/2013   ZOX:[id number] polyps/abnormal gastric because of uncertain significance-status post biopsy. Small hiatal PrismBlog.es chronic inflammation on bx. hyperplastic polyp   ESOPHAGOGASTRODUODENOSCOPY (EGD) WITH PROPOFOL N/A 01/29/2018   Procedure: ESOPHAGOGASTRODUODENOSCOPY (EGD) WITH PROPOFOL;  Surgeon: Daneil Dolin, MD;  Location: [facility];  Service: Endoscopy;  Laterality: N/A;  1:30   MALONEY DILATION N/A 01/29/2018   Procedure: Venia Minks DILATION;  Surgeon: Daneil Dolin, MD;  Location: [facility];  Service: Endoscopy;  Laterality: N/A;   URETHRAL STRICTURE DILATATION     WRIST FRACTURE SURGERY Right     Allergies:   Ciprofloxacin, Codeine, Ibuprofen, Amlodipine, Bisoprolol, Lasix [furosemide], Ranexa [ranolazine], Symbicort [budesonide-formoterol fumarate], Erythromycin, Imdur [isosorbide  nitrate], Mucinex [guaifenesin er], Nsaids, Roxicodone [oxycodone], and Shellfish allergy   Current Meds  Medication Sig   albuterol (VENTOLIN HFA) 108 (90 Base) MCG/ACT inhaler Inhale 2 puffs into the lungs every 6 (six) hours as needed for shortness of breath.   ALPRAZolam (XANAX) 1 MG tablet Take 1 mg by mouth 3 (three) times daily.   aspirin 81 MG EC tablet Take 81 mg by mouth daily.   atorvastatin (LIPITOR) 10 MG tablet Take 1 tablet by mouth daily.   Elastic Bandages & Supports (MEDICAL COMPRESSION STOCKINGS) MISC 1 each by Does not apply route as directed. Low Pressure Knee High Compression stockings Dx: leg swelling   gabapentin (NEURONTIN) 400 MG capsule Take 400 mg by mouth 3 (three) times daily.   hydrocortisone 2.5 % ointment APPLY AS DIRECTED DAILY AS NEEDED FOR ITCHING.   lidocaine (LIDODERM) 5 % Place 1 patch onto the skin every 12 (twelve) hours.   Multiple Vitamin (MULTI-DAY PO) Take by mouth daily.   nitroGLYCERIN (NITROSTAT) 0.4 MG SL tablet DISSOLVE 1 TABLET UNDER THE TONGUE EVERY 5 MINUTES AS NEEDED FOR CHEST PAIN. DO NOT EXCEED A TOTAL OF 3 DOSES IN 15 MINUTES.  oxyCODONE-acetaminophen (PERCOCET/ROXICET) 5-325 MG tablet Take 1 tablet by mouth 3 (three) times daily as needed.   pantoprazole (PROTONIX) 20 MG tablet Take 20 mg by mouth 2 (two) times daily before a meal.   RESTASIS 0.05 % ophthalmic emulsion Place 1 drop into both eyes 2 (two) times daily.   valACYclovir (VALTREX) 500 MG tablet Take 1 tablet by mouth daily.   vitamin C (ASCORBIC ACID) 500 MG tablet Take 500 mg by mouth daily.    Social History   Socioeconomic History   Marital status: Single    Spouse name: Not on file   Number of children: 1   Years of education: Not on file   Highest education level: Not on file  Occupational History   Occupation: disabled  Tobacco Use   Smoking status: Every Day    Packs/day: 0.50    Years: 45.00    Total pack years: 22.50    Types: Cigarettes    Start  date: 06/10/1974   Smokeless tobacco: Never  Vaping Use   Vaping Use: Never used  Substance and Sexual Activity   Alcohol use: No    Alcohol/week: 0.0 standard drinks of alcohol    Comment: Former user, heavy etoh use prior to 1990s.   Drug use: Not Currently    Types: Marijuana, Cocaine    Comment: 04/13/19-no cocaine; denies marijuana   Sexual activity: Not on file  Other Topics Concern   Not on file  Social History Narrative   ** Merged History Encounter **       Social Determinants of Health   Financial Resource Strain: Not on file  Food Insecurity: Not on file  Transportation Needs: Not on file  Physical Activity: Not on file  Stress: Not on file  Social Connections: Not on file     Family History: The patient's family history includes Alcohol abuse in her brother, brother, and sister; Anxiety disorder in her daughter; Breast cancer in her paternal aunt; Clotting disorder in her sister; Colon cancer (age of onset: 60) in her paternal uncle; Coronary artery disease in her mother; Diabetes in her brother, brother, and mother; Drug abuse in her brother, brother, and sister; Fibromyalgia in her daughter; Heart attack in her mother; Heart disease in her brother; Liver cancer in her sister; Obesity in her brother; Ovarian cancer in her maternal aunt; Stomach cancer (age of onset: 41) in her maternal aunt; Stomach cancer (age of onset: 78) in her paternal grandmother; Thyroid disease in her daughter.  ROS:   Review of Systems  Constitutional: Negative.   HENT: Negative.    Eyes: Negative.   Respiratory: Negative.    Cardiovascular:  Positive for chest pain and leg swelling. Negative for palpitations, orthopnea, claudication and PND.       See HPI.   Gastrointestinal: Negative.   Genitourinary: Negative.   Musculoskeletal: Negative.   Skin: Negative.   Neurological: Negative.   Endo/Heme/Allergies: Negative.   Psychiatric/Behavioral:  Negative for depression, hallucinations,  memory loss, substance abuse and suicidal ideas. The patient is nervous/anxious. The patient does not have insomnia.      Please see the history of present illness.    All other systems reviewed and are negative.  EKGs/Labs/Other Studies Reviewed:    The following studies were reviewed today:   EKG:  EKG is not ordered today.    Bilateral carotid duplex on August 30, 2021: IMPRESSION: Minimal amount of bilateral atherosclerotic plaque, right greater than left, not resulting in a hemodynamically significant stenosis  within either internal carotid artery.    Coronary CTA on May 20, 2019: IMPRESSION: 1. Severe CAD in the distal RCA and proximal-distal circumflex artery, CADRADS = 4. CT FFR analysis will be performed and reported separately.   2. The patient's coronary artery calcium score is 443, which places the patient in the 46 percentile for age and sex matched control.   3. Normal coronary origin with right dominance.   Recent Labs: No results found for requested labs within last 365 days.  Recent Lipid Panel No results found for: "CHOL", "TRIG", "HDL", "CHOLHDL", "VLDL", "LDLCALC", "LDLDIRECT"    Physical Exam:    VS:  BP (!) 160/80 (BP Location: Left Arm, Patient Position: Sitting, Cuff Size: Normal)   Pulse 69   Ht '4\' 10"'$  (1.473 m)   Wt 132 lb 9.6 oz (60.1 kg)   SpO2 97%   BMI 27.71 kg/m     Wt Readings from Last 3 Encounters:  12/16/22 132 lb 9.6 oz (60.1 kg)  12/06/22 131 lb 9.6 oz (59.7 kg)  08/28/22 132 lb 3.2 oz (60 kg)     GEN: Well nourished, well developed in no acute distress HEENT: Normal NECK: No JVD; No carotid bruits CARDIAC: S1/S2, RRR, no murmurs, rubs, gallops; 2+ pulses RESPIRATORY:  Clear and diminished to auscultation without rales, wheezing or rhonchi  MUSCULOSKELETAL:  2+ pitting edema along BLE; No deformity  SKIN: Warm and dry NEUROLOGIC:  Alert and oriented x 3 PSYCHIATRIC:  Nervous, calm during interview  ASSESSMENT:     1. Coronary artery disease involving native heart with angina pectoris, unspecified vessel or lesion type ([place])   2. Chest pain of uncertain etiology   3. Hypertension, unspecified type   4. Hyperlipidemia, unspecified hyperlipidemia type   5. Chronic obstructive pulmonary disease, unspecified COPD type ([place])   6. Leg edema    PLAN:    In order of problems listed above:  CAD, chest pain of uncertain etiology Patient is a difficult historian. Continues to note some atypical presentation of chest pain.  Coronary CTA in 2020 revealed severe CAD in his RCA and proximal and distal circumflex artery, coronary artery calcium score was 443.  FFR analysis demonstrated possible hemodynamically significant lesion distal LAD.  Vessel diameter just proximal to the lesion was 2.65 mm.  It was recommended to consider aggressive medical management.  Was previously ordered NST in past, however she declined.  Was recommended to consider cardiac catheterization for definitive evaluation in the future for chest pain. Case d/w Dr. Domenic Polite at previous [place], who recommended medical management at this time.  She has allergies to several antianginals, cannot tolerate Amlodipine, Ranexa, or Imdur..  Will continue to watch and observe for the next few weeks, and if no improvement in symptoms in next few weeks, strongly consider cardiac catheterization for definitive evaluation.  Continue aspirin, atorvastatin, and nitroglycerin as needed. Will Rx low dose Metoprolol succinate - see below. Heart healthy diet and regular cardiovascular exercise encouraged.   Hypertension BP on arrival 162/90, repeat BP 160/80.  Discussed to monitor BP at home at least 2 hours after medications and sitting for 5-10 minutes. No bradycardia today. Will initiate Toprol XL 25 mg daily. Heart healthy diet and regular cardiovascular exercise encouraged. If no improvement in BP by next OV, plan to refer to advanced HTN clinic.   Hyperlipidemia No  recent lipid panel on file.  Plan to discuss obtaining labs from PCP and fax over to our office at next [place].  Continue  atorvastatin. Heart healthy diet and regular cardiovascular exercise encouraged.   COPD Denies any recent [facility]. Clear and diminished to auscultation on exam. Has upcoming 2D echocardiogram arranged for past report of [facility]. Continue to follow with PCP.  5. Leg edema Continues to note +2 pitting edema bilaterally. Recommended at this time wearing compression stockings (I previously wrote a Rx for compression stockings at last OV, however she states she does not have this, will rewrite this Rx for her).  Low salt/heart healthy diet, and leg elevation. Unable to tolerate Lasix, have added to allergy list as she strongly requests not to take this again - see Telephone notes.     5. Disposition: Follow-up with me in 1 month as scheduled. She will complete her lab work that has been previously ordered.    Medication Adjustments/Labs and Tests Ordered: Current medicines are reviewed at length with the patient today.  Concerns regarding medicines are outlined above.  No orders of the defined types were placed in this encounter.  Meds ordered this encounter  Medications   metoprolol succinate (TOPROL XL) 25 MG 24 hr tablet    Sig: Take 1 tablet (25 mg total) by mouth daily.    Dispense:  90 tablet    Refill:  3    Patient Instructions  Medication Instructions:  Your physician has recommended you make the following change in your medication:  -Stop Amlodipine -Stop Lasix -Start Metoprolol Succinate 25 mg tablets once daily   Labwork: Complete Lab Work  Testing/Procedures: None  Follow-Up: Keep scheduled follow up appointment with Finis Bud, NP.   Any Other Special Instructions Will Be Listed Below (If Applicable).     If you need a refill on your cardiac medications before your next appointment, please call your pharmacy.    Signed, Finis Bud, NP   12/18/2022 5:06 PM    [place]

## 2022-12-13 NOTE — Telephone Encounter (Signed)
Patient states that she is not going to take this medication under no circumstances.  Multiple complaints and concerns about the diuretic and her symptoms.  Too lengthy to address in phone call.  Appointment given for Monday, 12/16/22 at 4:00 with Finis Bud, NP.

## 2022-12-13 NOTE — Telephone Encounter (Signed)
Addressed in different encounter.

## 2022-12-13 NOTE — Telephone Encounter (Signed)
Patient called stating she is still waiting on a callback.  She would like to be called back today, as she would like for another medication to be called in for her.

## 2022-12-13 NOTE — Telephone Encounter (Signed)
Patient called after hour line, states she can't tolerate amlodipine and bisoprolol in the past, was told to take lasix for HTN on last appt 12/06/22,  but does not want to take it due to many medical issues and fear side effect, she has appt on 12/16/22 with Finis Bud, advised the patient to discuss her concerns and we can determine different medication for HTN then, she agreed.

## 2022-12-13 NOTE — Telephone Encounter (Signed)
Patient is calling about her medication. Wants to know if she can be prescribed another BP medication. Please call back

## 2022-12-13 NOTE — Telephone Encounter (Signed)
Pt c/o medication issue:  1. Name of Medication: furosemide (LASIX) 20 MG tablet   2. How are you currently taking this medication (dosage and times per day)?   3. Are you having a reaction (difficulty breathing--STAT)?   4. What is your medication issue? Pt states "I researched this medication and I saw it leads to death." Pt also stated she researched her some of bp medications and said "I need one with less side effects, it makes me pee a lot" Please advise

## 2022-12-13 NOTE — Telephone Encounter (Signed)
Patient is calling back due to not hearing back yet. She states she is not taking the medication anymore whatsoever. Please advise.

## 2022-12-13 NOTE — Telephone Encounter (Signed)
Patient paged after hour service request call back. Called back at [phone] twice, voice mail is full and no one answers the phone.

## 2022-12-16 ENCOUNTER — Ambulatory Visit: Payer: Medicaid Other | Attending: Nurse Practitioner | Admitting: Nurse Practitioner

## 2022-12-16 ENCOUNTER — Encounter: Payer: Self-pay | Admitting: Nurse Practitioner

## 2022-12-16 VITALS — BP 160/80 | HR 69 | Ht <= 58 in | Wt 132.6 lb

## 2022-12-16 DIAGNOSIS — I10 Essential (primary) hypertension: Secondary | ICD-10-CM | POA: Diagnosis not present

## 2022-12-16 DIAGNOSIS — J449 Chronic obstructive pulmonary disease, unspecified: Secondary | ICD-10-CM

## 2022-12-16 DIAGNOSIS — I25119 Atherosclerotic heart disease of native coronary artery with unspecified angina pectoris: Secondary | ICD-10-CM | POA: Diagnosis not present

## 2022-12-16 DIAGNOSIS — R600 Localized edema: Secondary | ICD-10-CM

## 2022-12-16 DIAGNOSIS — E785 Hyperlipidemia, unspecified: Secondary | ICD-10-CM

## 2022-12-16 DIAGNOSIS — R079 Chest pain, unspecified: Secondary | ICD-10-CM

## 2022-12-16 MED ORDER — METOPROLOL SUCCINATE ER 25 MG PO TB24: 25.0000 mg | ORAL_TABLET | Freq: Every day | ORAL | 3 refills | Status: DC

## 2022-12-16 NOTE — Patient Instructions (Signed)
Medication Instructions:  Your physician has recommended you make the following change in your medication:  -Stop Amlodipine -Stop Lasix -Start Metoprolol Succinate 25 mg tablets once daily   Labwork: Complete Lab Work  Testing/Procedures: None  Follow-Up: Keep scheduled follow up appointment with Finis Bud, NP.   Any Other Special Instructions Will Be Listed Below (If Applicable).     If you need a refill on your cardiac medications before your next appointment, please call your pharmacy.

## 2022-12-18 ENCOUNTER — Encounter: Payer: Self-pay | Admitting: Nurse Practitioner

## 2022-12-20 ENCOUNTER — Telehealth: Payer: Self-pay | Admitting: Cardiology

## 2022-12-20 ENCOUNTER — Telehealth: Payer: Self-pay | Admitting: Nurse Practitioner

## 2022-12-20 NOTE — Telephone Encounter (Signed)
Reports metoprolol with MVT/mineral is causing her to have stomach and chest pain, insomnia and gas. Advised that she should take a plain MVT vs one with minerals. Says she does not want to take metoprolol due to side effects. Advised that these are symptoms she's had for a while and are less likely coming from metoprolol. Says she doesn't feel safe taking metoprolol and she is stopping it. Encouraged to continue metoprolol but patient declined. Advised that her HTN will not be treated without metoprolol. Says she understands but declines taking metoprolol.

## 2022-12-20 NOTE — Telephone Encounter (Signed)
Patient wants to know if she can be referred to a blood pressure specialist.

## 2022-12-20 NOTE — Telephone Encounter (Signed)
Pt c/o medication issue:  1. Name of Medication:  metoprolol succinate (TOPROL XL) 25 MG 24 hr tablet  2. How are you currently taking this medication (dosage and times per day)?  As prescribed  3. Are you having a reaction (difficulty breathing--STAT)?    4. What is your medication issue?   Patient states she has been taking this medication with a multivitamin/multi-mineral. She states she read that she should not take Metoprolol while on multivitamins. Last night she had a swelling in her breast that spread to her left side and she became nauseous. She assumes her symptoms were due to the medication interaction.

## 2022-12-23 ENCOUNTER — Ambulatory Visit: Payer: Medicaid Other | Attending: Nurse Practitioner

## 2022-12-23 DIAGNOSIS — R0609 Other forms of dyspnea: Secondary | ICD-10-CM | POA: Diagnosis not present

## 2022-12-23 LAB — ECHOCARDIOGRAM COMPLETE
AR max vel: 1.74 cm2
AV Area VTI: 2.1 cm2
AV Area mean vel: 1.92 cm2
AV Mean grad: 6.8 mmHg
AV Peak grad: 16.5 mmHg
Ao pk vel: 2.03 m/s
Area-P 1/2: 3.39 cm2
Calc EF: 75.6 %
S' Lateral: 2.8 cm
Single Plane A2C EF: 73.1 %
Single Plane A4C EF: 77.3 %

## 2022-12-24 ENCOUNTER — Telehealth: Payer: Self-pay | Admitting: Nurse Practitioner

## 2022-12-24 ENCOUNTER — Telehealth: Payer: Self-pay | Admitting: *Deleted

## 2022-12-24 MED ORDER — VERAPAMIL HCL ER 240 MG PO TBCR: 240.0000 mg | EXTENDED_RELEASE_TABLET | Freq: Every day | ORAL | 1 refills | Status: DC

## 2022-12-24 NOTE — Telephone Encounter (Signed)
Pt c/o medication issue:  1. Name of Medication: metoprolol succinate (TOPROL XL) 25 MG 24 hr tablet   2. How are you currently taking this medication (dosage and times per day)?   3. Are you having a reaction (difficulty breathing--STAT)?   4. What is your medication issue? Pt states she is not responding well to this medication. She states she has had some SOB. She states it almost "killed" me the way it makes me feel. She states this is not the right medication for me. Please advise. She states she just wants to be on a medication that regulates her bp with no side effects.

## 2022-12-24 NOTE — Telephone Encounter (Signed)
Please see echo result for response Referral placed

## 2022-12-24 NOTE — Telephone Encounter (Signed)
Per Benjamine Mola, let's start verapamil at 240 mg and let's call her back in 1 week with her BP log and bring her back in 1-2 weeks for BP check and EKG check.   Patient informed and verbalized understanding of plan.

## 2022-12-24 NOTE — Telephone Encounter (Signed)
-----  Message from Finis Bud, NP sent at 12/24/2022  9:06 AM EST ----- Please let her know that her echocardiogram is normal.  Heart is pumping very well.  Aortic valve is mildly calcified, this is a very benign finding, common during aging process.  No significant valvular abnormalities.  Overall reassuring result!  I have reviewed her telephone notes as she is contacted our office recently.  She has several allergies to cardiac and BP medications.   Because I believe there is a psychiatric component to this, I recommend referral to psychiatry for evaluation as they can help further evaluate this and this will also help improve BP.  I also recommend advanced hypertension clinic referral if she is not agreeable to psych referral.  Please let me know if there is any other assistance I can provide.  Finis Bud, AGNP-C

## 2022-12-24 NOTE — Telephone Encounter (Signed)
Patient called back stating metoprolol succinate (TOPROL XL) 25 MG 24 hr tablet  she is having insomnia, nausea, pains in her stomach, chest on the right side, tops of her shoulder, increased blood pressure, weakness, severe headache.  Patient stated she wants to stop this medication and she would like to go back on Verapamil HCl CR 300 MG CP24 or get alternate medication.

## 2022-12-24 NOTE — Telephone Encounter (Signed)
Patient informed and verbalized understanding of plan. Copy sent to PCP  Note-currently sees Dr. Reece Levy in [facility] for psychiatry and has visit 12/25/2022.

## 2022-12-30 ENCOUNTER — Telehealth: Payer: Self-pay | Admitting: Physician Assistant

## 2022-12-30 ENCOUNTER — Telehealth: Payer: Self-pay | Admitting: Cardiology

## 2022-12-30 NOTE — Telephone Encounter (Signed)
Patient paged after hour answering service requesting for diuretic.  She says she has been having leg edema.  She has multiple medication intolerances.  She could not tolerate 20 mg low-dose Lasix before.  On further discussion, she is using compression stocking and she is not elevating her leg.  However I do think she is drinking too much fluid.  She drinks at least 6 bottles of 16 ounce of fluid.  I told her to cut it down to 4 bottles or less.  She is continue to elevate her leg and to follow-up with Benjamine Mola.  If she could not tolerate Lasix, spironolactone may be alternative.

## 2022-12-30 NOTE — Telephone Encounter (Signed)
Patient states it is okay to leave a detailed vm, because she has another appointment.

## 2022-12-30 NOTE — Telephone Encounter (Signed)
Patient is returning RN's call. Please advise. 

## 2022-12-30 NOTE — Telephone Encounter (Signed)
Pt c/o medication issue:  1. Name of Medication:   atorvastatin (LIPITOR) 10 MG tablet    2. How are you currently taking this medication (dosage and times per day)? As prescribed   3. Are you having a reaction (difficulty breathing--STAT)? Yes   4. What is your medication issue? Pt states that her legs have been swelling and she believes it could be due to the medication and requesting fluid pill to be called in.

## 2022-12-31 NOTE — Telephone Encounter (Signed)
Thanks for your assistance, Isaac Laud. Will discuss this with her at next office visit later this month.   Appreciate you!   Take Care, Finis Bud, NP

## 2022-12-31 NOTE — Telephone Encounter (Signed)
Patient says she has not concerns with atorvastatin. Says she was calling about the swelling and she discussed with Almyra Deforest on yesterday after 5:00 pm. Reports swelling in legs has improved since stopping amlodipine. Reviewed recommendations from [facility] with patient (further discussion, she is using compression stocking and she is not elevating her leg. However I do think she is drinking too much fluid. She drinks at least 6 bottles of 16 ounce of fluid. I told her to cut it down to 4 bottles or less. She is continue to elevate her leg and to follow-up with Benjamine Mola.) Advised that this could be discussed with [place] Calix at her next visit (01/16/2023) as well. Verbalized understanding of plan.

## 2023-01-01 ENCOUNTER — Encounter: Payer: Self-pay | Admitting: *Deleted

## 2023-01-06 ENCOUNTER — Telehealth: Payer: Self-pay | Admitting: Cardiology

## 2023-01-06 ENCOUNTER — Telehealth: Payer: Self-pay | Admitting: Nurse Practitioner

## 2023-01-06 ENCOUNTER — Ambulatory Visit: Payer: Medicaid Other

## 2023-01-06 NOTE — Telephone Encounter (Signed)
Noted that is fine

## 2023-01-06 NOTE — Telephone Encounter (Signed)
Pt is returning call  in regards to missed nurse visit and is requesting return call.

## 2023-01-06 NOTE — Telephone Encounter (Signed)
Pt stated she was not aware of her nurse visit today and that she did not have transportation set up to bring her today.   She stated she would rather have it done when she comes for her apt next week

## 2023-01-16 ENCOUNTER — Ambulatory Visit: Payer: Medicaid Other | Attending: Nurse Practitioner | Admitting: Nurse Practitioner

## 2023-01-16 ENCOUNTER — Encounter: Payer: Self-pay | Admitting: Nurse Practitioner

## 2023-01-16 VITALS — BP 162/82 | HR 62 | Ht <= 58 in | Wt 129.2 lb

## 2023-01-16 DIAGNOSIS — I10 Essential (primary) hypertension: Secondary | ICD-10-CM | POA: Diagnosis not present

## 2023-01-16 DIAGNOSIS — E785 Hyperlipidemia, unspecified: Secondary | ICD-10-CM

## 2023-01-16 DIAGNOSIS — I2510 Atherosclerotic heart disease of native coronary artery without angina pectoris: Secondary | ICD-10-CM

## 2023-01-16 DIAGNOSIS — J449 Chronic obstructive pulmonary disease, unspecified: Secondary | ICD-10-CM | POA: Diagnosis not present

## 2023-01-16 DIAGNOSIS — R600 Localized edema: Secondary | ICD-10-CM

## 2023-01-16 MED ORDER — VERAPAMIL HCL ER 300 MG PO CP24: 300.0000 mg | ORAL_CAPSULE | Freq: Every day | ORAL | 6 refills | Status: DC

## 2023-01-16 NOTE — Patient Instructions (Addendum)
Medication Instructions:  Your physician has recommended you make the following change in your medication:  Increase verapamil to 300 mg daily Continue other medications the same  Labwork: none  Testing/Procedures: none  Follow-Up: Your physician recommends that you schedule a follow-up appointment in: 6 months with Dr. Domenic Polite  Any Other Special Instructions Will Be Listed Below (If Applicable).  If you need a refill on your cardiac medications before your next appointment, please call your pharmacy.

## 2023-01-16 NOTE — Progress Notes (Signed)
Cardiology Office Note:    Date:  01/16/2023  ID:  Victoria Townsend, DOB December 05, 1960, MRN [id number]  PCP:  Luciano Cutter, [place] Prov[facility] Cardiologist:  Rozann Lesches, MD     Referring MD: Leeanne Rio, MD   CC: Here for follow-up  History of Present Illness:    Victoria Townsend is a 62 y.o. female with a hx of the following:  CAD Hypertension Hyperlipidemia Tobacco use Anxiety, PTSD COPD  In 2020, had coronary CTA performed that revealed severe stenosis along distal LAD and distal RCA with aggressive medical management recommended.  Patient is a 62 year old female with past medical history as mentioned above.  In May 2023 she noted numbness along left side of face, intermittent tightness along her back and chest.  Evaluated by EMS, BP elevated.  Patient reported at that time exposure to black mold in her home and thought this was contributing to her symptoms.  Verapamil discontinued, started on bisoprolol 10 mg daily and was restarted on Ranexa 500 mg twice daily, previously tolerant to Imdur.  Exercise Myoview was recommended for further evaluation, however this was not performed.  She was ultimately started on amlodipine 5 mg daily.  She did contact the office in the interim reporting chest pain that was recurrent, ED evaluation was suggested and recommended, but was not pursued.  At follow-up with Bernerd Pho, PA-C in August 2023, Norvasc was increased to 10 mg daily for exertional chest pain.  It was suggested that she continues to have chest pain in the future, will need to consider cardiac catheterization for definitive evaluation as she previously declined NST.  Was smoking 1 pack/day.  Last seen by Dr. Domenic Polite on August 28, 2022.  Dr. Domenic Polite reviewed her medications and noted that she did not tolerate Imdur or Ranexa in the past.  Dr. Domenic Polite recommended continuing medical therapy and risk factor modification for CAD.  Was told to follow-up in 6  months.  I saw her for follow-up on 12/06/2022.  She presented today for follow-up evaluation for her blood pressure, however CC was chest pain under bilateral breast tissues, "stinging like a bee." Noticed for past couple of weeks, stable over time, and increased shortness of breath, was taking NG intermittently. Was unable to tell what made CP better or worse.   She admitted to recent stress, denied any SI or HI. Bisoprolol was d/c d/t bradycardia. Found to be intolerant to Ranexa and Imdur. Due to atypical symptoms and no acute ischemic changes on EKG, would monitor symptoms and further evaluate need for cardiac cath at future follow-up.   Soon after office visit with me, she contacted our office noting leg edema. Was prescribed compression stockings and Lasix 20 mg daily since kidney function was WNL. She called back to office noting researching Lasix that "I saw it leads to death," and "I need one with less side effects, it makes me pee a lot." She called back and stated she was not going to take the medication anymore. Was made an office visit for further discussion of her medications.   12/16/2022 - Saw her for follow-up of her medications. She stated she could not tolerate Lasix as this caused, "headaches, pukes." Says she cannot tolerate the fact that Lasix makes her go to the bathroom. BP elevated. Noted atypical chest pain. Had stable leg edema, stopped taking Amlodipine. Could not tolerate Toprol-XL and was initiated on Verapamil as she can tolerate this.   01/16/2023 - Doing  better. Swelling has improved. Denies any chest pain, shortness of breath, palpitations, syncope, presyncope, dizziness, orthopnea, PND, swelling or significant weight changes, acute bleeding, or claudication.   Past Medical History:  Diagnosis Date   Agoraphobia    Anxiety disorder    Arthritis    Asthma    Bladder polyps    CAD (coronary artery disease)    Cardiac CTA June 2020   Colon polyps    COPD (chronic  obstructive pulmonary disease) (HCC)    Depression    Emphysema lung (HCC)    Essential hypertension    Fibromyalgia    Gallstones    GERD (gastroesophageal reflux disease)    Herpes    Irregular heartbeat    Nephrolithiasis    Polysubstance abuse (HCC)    History of cocaine, alcohol, marijuana in the past   PTSD (post-traumatic stress disorder)    Shoulder pain    Shoulder has been injected    Past Surgical History:  Procedure Laterality Date   APPENDECTOMY     Bladder polyps     CESAREAN SECTION WITH BILATERAL TUBAL LIGATION     CHOLECYSTECTOMY     COLONOSCOPY  May 2012   Dr. Posey Pronto: scattered diverticula pancolonic   COLONOSCOPY WITH PROPOFOL N/A 01/29/2018   Procedure: COLONOSCOPY WITH PROPOFOL;  Surgeon: Daneil Dolin, MD;  [facility]: [facility];  Service: Endoscopy;  Laterality: N/A;   ESOPHAGOGASTRODUODENOSCOPY  April 2012   Dr. Posey Pronto: gastritis   ESOPHAGOGASTRODUODENOSCOPY N/A 08/09/2013   [id number] polyps/abnormal gastric because of uncertain significance-status post biopsy. Small hiatal PrismBlog.es chronic inflammation on bx. hyperplastic polyp   ESOPHAGOGASTRODUODENOSCOPY (EGD) WITH PROPOFOL N/A 01/29/2018   Procedure: ESOPHAGOGASTRODUODENOSCOPY (EGD) WITH PROPOFOL;  Surgeon: Daneil Dolin, MD;  Location: [facility];  Service: Endoscopy;  Laterality: N/A;  1:30   MALONEY DILATION N/A 01/29/2018   Procedure: Venia Minks DILATION;  Surgeon: Daneil Dolin, MD;  Location: [facility];  Service: Endoscopy;  Laterality: N/A;   URETHRAL STRICTURE DILATATION     WRIST FRACTURE SURGERY Right     Allergies:   Ciprofloxacin, Codeine, Ibuprofen, Amlodipine, Bisoprolol, Lasix [furosemide], Ranexa [ranolazine], Symbicort [budesonide-formoterol fumarate], Erythromycin, Imdur [isosorbide nitrate], Mucinex [guaifenesin er], Nsaids, Roxicodone [oxycodone], and Shellfish allergy   Current Meds  Medication Sig   albuterol (VENTOLIN HFA) 108 (90 Base) MCG/ACT inhaler Inhale 2  puffs into the lungs every 6 (six) hours as needed for shortness of breath.   ALPRAZolam (XANAX) 1 MG tablet Take 1 mg by mouth 3 (three) times daily.   aspirin 81 MG EC tablet Take 81 mg by mouth daily.   atorvastatin (LIPITOR) 10 MG tablet Take 1 tablet by mouth daily.   gabapentin (NEURONTIN) 400 MG capsule Take 400 mg by mouth 3 (three) times daily.   hydrocortisone 2.5 % ointment APPLY AS DIRECTED DAILY AS NEEDED FOR ITCHING.   lidocaine (LIDODERM) 5 % Place 1 patch onto the skin every 12 (twelve) hours.   nitroGLYCERIN (NITROSTAT) 0.4 MG SL tablet DISSOLVE 1 TABLET UNDER THE TONGUE EVERY 5 MINUTES AS NEEDED FOR CHEST PAIN. DO NOT EXCEED A TOTAL OF 3 DOSES IN 15 MINUTES.   oxybutynin (DITROPAN) 5 MG tablet Take 5 mg by mouth daily.   oxyCODONE-acetaminophen (PERCOCET) 7.5-325 MG tablet Take 1 tablet by mouth 3 (three) times daily as needed.   pantoprazole (PROTONIX) 40 MG tablet Take 40 mg by mouth 2 (two) times daily.   RESTASIS 0.05 % ophthalmic emulsion Place 1 drop into both eyes 2 (two)  times daily.   valACYclovir (VALTREX) 500 MG tablet Take 1 tablet by mouth daily.   vitamin C (ASCORBIC ACID) 500 MG tablet Take 500 mg by mouth daily.   verapamil (CALAN-SR) 240 MG CR tablet Take 1 tablet (240 mg total) by mouth at bedtime.    Social History   Socioeconomic History   Marital status: Single    Spouse name: Not on file   Number of children: 1   Years of education: Not on file   Highest education level: Not on file  Occupational History   Occupation: disabled  Tobacco Use   Smoking status: Every Day    Packs/day: 0.50    Years: 45.00    Total pack years: 22.50    Types: Cigarettes    Start date: 06/10/1974   Smokeless tobacco: Never  Vaping Use   Vaping Use: Never used  Substance and Sexual Activity   Alcohol use: No    Alcohol/week: 0.0 standard drinks of alcohol    Comment: Former user, heavy etoh use prior to 1990s.   Drug use: Not Currently    Types: Marijuana,  Cocaine    Comment: 04/13/19-no cocaine; denies marijuana   Sexual activity: Not on file  Other Topics Concern   Not on file  Social History Narrative   ** Merged History Encounter **       Social Determinants of Health   Financial Resource Strain: Not on file  Food Insecurity: Not on file  Transportation Needs: Not on file  Physical Activity: Not on file  Stress: Not on file  Social Connections: Not on file     Family History: The patient's family history includes Alcohol abuse in her brother, brother, and sister; Anxiety disorder in her daughter; Breast cancer in her paternal aunt; Clotting disorder in her sister; Colon cancer (age of onset: 74) in her paternal uncle; Coronary artery disease in her mother; Diabetes in her brother, brother, and mother; Drug abuse in her brother, brother, and sister; Fibromyalgia in her daughter; Heart attack in her mother; Heart disease in her brother; Liver cancer in her sister; Obesity in her brother; Ovarian cancer in her maternal aunt; Stomach cancer (age of onset: 46) in her maternal aunt; Stomach cancer (age of onset: 77) in her paternal grandmother; Thyroid disease in her daughter.  ROS:    Please see the history of present illness.    All other systems reviewed and are negative.  EKGs/Labs/Other Studies Reviewed:    The following studies were reviewed today:   EKG:  EKG is not ordered today.    Echocardiogram on 12/23/2022: 1. Left ventricular ejection fraction, by estimation, is 65 to 70%. The  left ventricle has normal function. The left ventricle has no regional  wall motion abnormalities. Left ventricular diastolic parameters are  indeterminate. Elevated left atrial  pressure. The average left ventricular global longitudinal strain is -21.2  %. The global longitudinal strain is normal.   2. Right ventricular systolic function is normal. The right ventricular  size is normal.   3. The mitral valve is normal in structure. No  evidence of mitral valve  regurgitation. No evidence of mitral stenosis.   4. The aortic valve is tricuspid. There is mild calcification of the  aortic valve. There is mild thickening of the aortic valve. Aortic valve  regurgitation is not visualized. No aortic stenosis is present.   5. The inferior vena cava is normal in size with greater than 50%  respiratory variability, suggesting right atrial  pressure of 3 mmHg.   Comparison(s): Echocardiogram done 10/01/12 showed an Ef of 60-65%.  Bilateral carotid duplex on August 30, 2021: IMPRESSION: Minimal amount of bilateral atherosclerotic plaque, right greater than left, not resulting in a hemodynamically significant stenosis within either internal carotid artery.    Coronary CTA on May 20, 2019: IMPRESSION: 1. Severe CAD in the distal RCA and proximal-distal circumflex artery, CADRADS = 4. CT FFR analysis will be performed and reported separately.   2. The patient's coronary artery calcium score is 443, which places the patient in the 30 percentile for age and sex matched control.   3. Normal coronary origin with right dominance.   Recent Labs: No results found for requested labs within last 365 days.  Recent Lipid Panel No results found for: "CHOL", "TRIG", "HDL", "CHOLHDL", "VLDL", "LDLCALC", "LDLDIRECT"    Physical Exam:    VS:  BP (!) 162/82 (BP Location: Left Arm, Patient Position: Sitting)   Pulse 62   Ht '4\' 10"'$  (1.473 m)   Wt 129 lb 3.2 oz (58.6 kg)   SpO2 97%   BMI 27.00 kg/m     Wt Readings from Last 3 Encounters:  01/16/23 129 lb 3.2 oz (58.6 kg)  12/16/22 132 lb 9.6 oz (60.1 kg)  12/06/22 131 lb 9.6 oz (59.7 kg)     GEN: Well nourished, well developed in no acute distress HEENT: Normal NECK: No JVD; No carotid bruits CARDIAC: S1/S2, RRR, no murmurs, rubs, gallops; 2+ pulses RESPIRATORY:  Clear and diminished to auscultation without rales, wheezing or rhonchi  MUSCULOSKELETAL:  Nonpitting edema along  BLE; No deformity  SKIN: Warm and dry NEUROLOGIC:  Alert and oriented x 3 PSYCHIATRIC:  Nervous, calm during interview  ASSESSMENT:    1. Coronary artery disease involving native coronary artery of native heart without angina pectoris   2. Essential hypertension, benign   3. Hyperlipidemia, unspecified hyperlipidemia type   4. Chronic obstructive pulmonary disease, unspecified COPD type ([place])   5. Leg edema     PLAN:    In order of problems listed above:  CAD Patient is a difficult historian. Denies any chest pain. Coronary CTA in 2020 revealed severe CAD in his RCA and proximal and distal circumflex artery, coronary artery calcium score was 443.  FFR analysis demonstrated possible hemodynamically significant lesion distal LAD.  Vessel diameter just proximal to the lesion was 2.65 mm.  It was recommended to consider aggressive medical management.  Was previously ordered NST in past, however she declined.  Was recommended to consider cardiac catheterization for definitive evaluation in the future for chest pain. She has allergies to several antianginals, cannot tolerate Amlodipine, Ranexa, or Imdur. Continue aspirin, atorvastatin, and nitroglycerin as needed. Heart healthy diet and regular cardiovascular exercise encouraged.   Hypertension BP on arrival 154/80, repeat BP 162/82.  Discussed to monitor BP at home at least 2 hours after medications and sitting for 5-10 minutes. No bradycardia today. Heart healthy diet and regular cardiovascular exercise encouraged. Will increase verapamil. Continue rest of current medication regimen. Consider referral to advanced HTN in future if BP poorly controlled.   3. Hyperlipidemia Managed by PCP. Continue atorvastatin. Heart healthy diet and regular cardiovascular exercise encouraged.   COPD Denies any recent [facility] Continue to follow with PCP.  5. Leg edema Improved leg swelling.  Low salt/heart healthy diet, and leg elevation discussed.    5.  Disposition: Follow-up with Dr. Domenic Polite or APP in 6 months or sooner if anything changes.   Medication  Adjustments/Labs and Tests Ordered: Current medicines are reviewed at length with the patient today.  Concerns regarding medicines are outlined above.  No orders of the defined types were placed in this encounter.  Meds ordered this encounter  Medications   Verapamil HCl CR 300 MG CP24    Sig: Take 1 capsule (300 mg total) by mouth daily.    Dispense:  30 capsule    Refill:  6    01/16/2023 dose increase    Patient Instructions  Medication Instructions:  Your physician has recommended you make the following change in your medication:  Increase verapamil to 300 mg daily Continue other medications the same  Labwork: none  Testing/Procedures: none  Follow-Up: Your physician recommends that you schedule a follow-up appointment in: 6 months with Dr. Domenic Polite  Any Other Special Instructions Will Be Listed Below (If Applicable).  If you need a refill on your cardiac medications before your next appointment, please call your pharmacy.    Signed, Finis Bud, NP  01/19/2023 10:10 PM    [facility]

## 2023-01-17 ENCOUNTER — Telehealth: Payer: Self-pay | Admitting: Nurse Practitioner

## 2023-01-17 ENCOUNTER — Encounter: Payer: Self-pay | Admitting: *Deleted

## 2023-01-17 NOTE — Telephone Encounter (Signed)
New Message:       Patient said the pharmacist says she needs a prior authorization for her Verapamil please.

## 2023-01-17 NOTE — Progress Notes (Signed)
Arayla Chaya Jan (KeyAlyson Ingles) PA Case ID #: [id number] Rx #: [id number] Need Help? Call us at [phone] Outcome Approved today PA Case: [id number], Status: Approved, Coverage Starts on: 01/17/2023 12:00:00 AM, Coverage Ends on: 01/17/2024 12:00:00 AM. Authorization Expiration Date: 01/16/2024

## 2023-01-17 NOTE — Telephone Encounter (Signed)
Advised that PA for verapamil 300 mg was submitted through [facility] on yesterday and we are waiting on a response from her insurance.

## 2023-01-17 NOTE — Telephone Encounter (Signed)
Victoria Townsend (Victoria Townsend) PA Case ID #: [id number] Rx #: [id number] Need Help? Call us at [phone] Outcome Approved today PA Case: [id number], Status: Approved, Coverage Starts on: 01/17/2023 12:00:00 AM, Coverage Ends on: 01/17/2024 12:00:00 AM. Authorization Expiration Date: 01/16/2024

## 2023-01-17 NOTE — Telephone Encounter (Signed)
New Message:......       Patient said her blood pressure today is 175/86. She wants to know if this is a good blood pressure reading?     Pt c/o BP issue: STAT if pt c/o blurred vision, one-sided weakness or slurred speech  1. What are your last 5 BP readings? Today it is 175/86  2. Are you having any other symptoms (ex. Dizziness, headache, blurred vision, passed out)? no  3. What is your BP issue? Patient wants to know if this is a good reading?

## 2023-01-17 NOTE — Telephone Encounter (Signed)
Advised to continue monitoring BP's, PA for verapamil is in progress. Saw provider 01/16/2023 for BP management. Continue same plan for now. Patient aware.

## 2023-01-19 ENCOUNTER — Encounter: Payer: Self-pay | Admitting: Nurse Practitioner

## 2023-02-10 ENCOUNTER — Ambulatory Visit (HOSPITAL_COMMUNITY)
Admission: RE | Admit: 2023-02-10 | Discharge: 2023-02-10 | Disposition: A | Discharge status: Home / Self Care | Payer: Medicaid Other | Source: Ambulatory Visit | Attending: Physician Assistant | Admitting: Physician Assistant

## 2023-02-10 DIAGNOSIS — Z87891 Personal history of nicotine dependence: Secondary | ICD-10-CM | POA: Insufficient documentation

## 2023-02-10 DIAGNOSIS — Z122 Encounter for screening for malignant neoplasm of respiratory organs: Secondary | ICD-10-CM | POA: Diagnosis present

## 2023-02-28 NOTE — Progress Notes (Signed)
Patient notified of LDCT Lung Cancer Screening Results via mail with the recommendation to follow-up in 12 months. Patient's referring provider has been sent a copy of results. Results are as follows:  IMPRESSION: 1. Lung-RADS 2, benign appearance or behavior. Continue annual screening with low-dose chest CT without contrast in 12 months. 2. Tiny left renal stone. 3. Aortic atherosclerosis (ICD10-I70.0). Coronary artery calcification. 4.  Emphysema (ICD10-J43.9).

## 2023-03-06 ENCOUNTER — Ambulatory Visit: Payer: Medicaid Other | Admitting: Cardiology

## 2023-05-25 ENCOUNTER — Other Ambulatory Visit: Payer: Self-pay | Admitting: Nurse Practitioner

## 2023-07-10 ENCOUNTER — Ambulatory Visit: Payer: Medicaid Other | Admitting: Cardiology

## 2023-07-10 ENCOUNTER — Telehealth: Payer: Self-pay | Admitting: Cardiology

## 2023-07-10 NOTE — Telephone Encounter (Signed)
New Message:     This pt's transportation left her. They wanted to know if there is anyway that she can come later today ,

## 2023-07-10 NOTE — Telephone Encounter (Signed)
Dr.McDowell has a 2 pm opening, call center will let me know.

## 2023-07-10 NOTE — Progress Notes (Deleted)
    Cardiology Office Note  Date: 07/10/2023   ID: Victoria Townsend, DOB [birth year], MRN [id number]  History of Present Illness: Victoria Townsend is a 62 y.o. female last seen in February.  Multiple magnification intolerances including amlodipine, bisoprolol, Ranexa, and Imdur.  Physical Exam: VS:  There were no vitals taken for this visit., BMI There is no height or weight on file to calculate BMI.  Wt Readings from Last 3 Encounters:  01/16/23 129 lb 3.2 oz (58.6 kg)  12/16/22 132 lb 9.6 oz (60.1 kg)  12/06/22 131 lb 9.6 oz (59.7 kg)    General: Patient appears comfortable at rest. HEENT: Conjunctiva and lids normal, oropharynx clear with moist mucosa. Neck: Supple, no elevated JVP or carotid bruits, no thyromegaly. Lungs: Clear to auscultation, nonlabored breathing at rest. Cardiac: Regular rate and rhythm, no S3 or significant systolic murmur, no pericardial rub. Abdomen: Soft, nontender, no hepatomegaly, bowel sounds present, no guarding or rebound. Extremities: No pitting edema, distal pulses 2+. Skin: Warm and dry. Musculoskeletal: No kyphosis. Neuropsychiatric: Alert and oriented x3, affect grossly appropriate.  ECG:  An ECG dated 12/06/2022 was personally reviewed today and demonstrated:  Sinus bradycardia with R' in lead V1.  Labwork:  January 2021: Cholesterol 164, triglycerides 163, HDL 57, LDL 79 April 2024: Potassium 4.3, BUN 9, creatinine 0.91, hemoglobin A1c 5.7%  Other Studies Reviewed Today:  Echocardiogram 12/23/2022:  1. Left ventricular ejection fraction, by estimation, is 65 to 70%. The  left ventricle has normal function. The left ventricle has no regional  wall motion abnormalities. Left ventricular diastolic parameters are  indeterminate. Elevated left atrial  pressure. The average left ventricular global longitudinal strain is -21.2  %. The global longitudinal strain is normal.   2. Right ventricular systolic function is normal. The right ventricular   size is normal.   3. The mitral valve is normal in structure. No evidence of mitral valve  regurgitation. No evidence of mitral stenosis.   4. The aortic valve is tricuspid. There is mild calcification of the  aortic valve. There is mild thickening of the aortic valve. Aortic valve  regurgitation is not visualized. No aortic stenosis is present.   5. The inferior vena cava is normal in size with greater than 50%  respiratory variability, suggesting right atrial pressure of 3 mmHg.   Assessment and Plan:  1.  CAD with coronary CTA in 2020 demonstrating distal LAD disease of suspected hemodynamic significance based on FFR with plan for medical therapy, otherwise no clear obstructive stenoses in the circumflex or RCA.  LVEF 65 to 70% by echocardiogram in January.  2.  Essential hypertension.  3.  Mixed hyperlipidemia.  Disposition:  Follow up {follow up:15908}  Signed, Jonelle Sidle, M.D., F.A.C.C. [facility]

## 2023-07-11 ENCOUNTER — Ambulatory Visit: Payer: Medicaid Other | Admitting: Nurse Practitioner

## 2023-07-11 NOTE — Progress Notes (Deleted)
Cardiology Office Note:    Date:  01/16/2023  ID:  Victoria Townsend, DOB 01-08-1961, MRN [id number]  PCP:  Catalina Lunger, DO   [facility] Providers Cardiologist:  Nona Dell, MD     Referring MD: Catalina Lunger, DO   CC: Here for follow-up  History of Present Illness:    Victoria Townsend is a 62 y.o. female with a hx of the following:  CAD Hypertension Hyperlipidemia Tobacco use Anxiety, PTSD COPD  In 2020, had coronary CTA performed that revealed severe stenosis along distal LAD and distal RCA with aggressive medical management recommended.  Patient is a 62 year old female with past medical history as mentioned above.  In May 2023 she noted numbness along left side of face, intermittent tightness along her back and chest.  Evaluated by EMS, BP elevated.  Patient reported at that time exposure to black mold in her home and thought this was contributing to her symptoms.  Verapamil discontinued, started on bisoprolol 10 mg daily and was restarted on Ranexa 500 mg twice daily, previously tolerant to Imdur.  Exercise Myoview was recommended for further evaluation, however this was not performed.  She was ultimately started on amlodipine 5 mg daily.  She did contact the office in the interim reporting chest pain that was recurrent, ED evaluation was suggested and recommended, but was not pursued.  At follow-up with Randall An, PA-C in August 2023, Norvasc was increased to 10 mg daily for exertional chest pain.  It was suggested that she continues to have chest pain in the future, will need to consider cardiac catheterization for definitive evaluation as she previously declined NST.  Was smoking 1 pack/day.  Last seen by Dr. Diona Browner on August 28, 2022.  Dr. Diona Browner reviewed her medications and noted that she did not tolerate Imdur or Ranexa in the past.  Dr. Diona Browner recommended continuing medical therapy and risk factor modification for CAD.  Was told to follow-up in 6  months.  I saw her for follow-up on 12/06/2022.  She presented today for follow-up evaluation for her blood pressure, however CC was chest pain under bilateral breast tissues, "stinging like a bee." Noticed for past couple of weeks, stable over time, and increased shortness of breath, was taking NG intermittently. Was unable to tell what made CP better or worse.   She admitted to recent stress, denied any SI or HI. Bisoprolol was d/c d/t bradycardia. Found to be intolerant to Ranexa and Imdur. Due to atypical symptoms and no acute ischemic changes on EKG, would monitor symptoms and further evaluate need for cardiac cath at future follow-up.   Soon after office visit with me, she contacted our office noting leg edema. Was prescribed compression stockings and Lasix 20 mg daily since kidney function was WNL. She called back to office noting researching Lasix that "I saw it leads to death," and "I need one with less side effects, it makes me pee a lot." She called back and stated she was not going to take the medication anymore. Was made an office visit for further discussion of her medications.   12/16/2022 - Saw her for follow-up of her medications. She stated she could not tolerate Lasix as this caused, "headaches, pukes." Says she cannot tolerate the fact that Lasix makes her go to the bathroom. BP elevated. Noted atypical chest pain. Had stable leg edema, stopped taking Amlodipine. Could not tolerate Toprol-XL and was initiated on Verapamil as she can tolerate this.   01/16/2023 - Doing better.  Swelling has improved. Denies any chest pain, shortness of breath, palpitations, syncope, presyncope, dizziness, orthopnea, PND, swelling or significant weight changes, acute bleeding, or claudication.   Past Medical History:  Diagnosis Date   Agoraphobia    Anxiety disorder    Arthritis    Asthma    Bladder polyps    CAD (coronary artery disease)    Cardiac CTA June 2020   Colon polyps    COPD (chronic  obstructive pulmonary disease) (HCC)    Depression    Emphysema lung (HCC)    Essential hypertension    Fibromyalgia    Gallstones    GERD (gastroesophageal reflux disease)    Herpes    Irregular heartbeat    Nephrolithiasis    Polysubstance abuse (HCC)    History of cocaine, alcohol, marijuana in the past   PTSD (post-traumatic stress disorder)    Shoulder pain    Shoulder has been injected    Past Surgical History:  Procedure Laterality Date   APPENDECTOMY     Bladder polyps     CESAREAN SECTION WITH BILATERAL TUBAL LIGATION     CHOLECYSTECTOMY     COLONOSCOPY  May 2012   Dr. Allena Katz: scattered diverticula pancolonic   COLONOSCOPY WITH PROPOFOL N/A 01/29/2018   Procedure: COLONOSCOPY WITH PROPOFOL;  Surgeon: Corbin Ade, MD;  [facility];  Service: Endoscopy;  Laterality: N/A;   ESOPHAGOGASTRODUODENOSCOPY  April 2012   Dr. Allena Katz: gastritis   ESOPHAGOGASTRODUODENOSCOPY N/A 08/09/2013   ZOX:WRUEAVW polyps/abnormal gastric because of uncertain significance-status post biopsy. Small hiatal [employer]tn chronic inflammation on bx. hyperplastic polyp   ESOPHAGOGASTRODUODENOSCOPY (EGD) WITH PROPOFOL N/A 01/29/2018   Procedure: ESOPHAGOGASTRODUODENOSCOPY (EGD) WITH PROPOFOL;  Surgeon: Corbin Ade, MD;  Location: [facility];  Service: Endoscopy;  Laterality: N/A;  1:30   MALONEY DILATION N/A 01/29/2018   Procedure: Elease Hashimoto DILATION;  Surgeon: Corbin Ade, MD;  Location: [facility];  Service: Endoscopy;  Laterality: N/A;   URETHRAL STRICTURE DILATATION     WRIST FRACTURE SURGERY Right     Allergies:   Ciprofloxacin, Codeine, Ibuprofen, Amlodipine, Bisoprolol, Lasix [furosemide], Ranexa [ranolazine], Symbicort [budesonide-formoterol fumarate], Erythromycin, Imdur [isosorbide nitrate], Mucinex [guaifenesin er], Nsaids, Roxicodone [oxycodone], and Shellfish allergy   Current Meds  Medication Sig   albuterol (VENTOLIN HFA) 108 (90 Base) MCG/ACT inhaler Inhale 2  puffs into the lungs every 6 (six) hours as needed for shortness of breath.   ALPRAZolam (XANAX) 1 MG tablet Take 1 mg by mouth 3 (three) times daily.   aspirin 81 MG EC tablet Take 81 mg by mouth daily.   atorvastatin (LIPITOR) 10 MG tablet Take 1 tablet by mouth daily.   gabapentin (NEURONTIN) 400 MG capsule Take 400 mg by mouth 3 (three) times daily.   hydrocortisone 2.5 % ointment APPLY AS DIRECTED DAILY AS NEEDED FOR ITCHING.   lidocaine (LIDODERM) 5 % Place 1 patch onto the skin every 12 (twelve) hours.   nitroGLYCERIN (NITROSTAT) 0.4 MG SL tablet DISSOLVE 1 TABLET UNDER THE TONGUE EVERY 5 MINUTES AS NEEDED FOR CHEST PAIN. DO NOT EXCEED A TOTAL OF 3 DOSES IN 15 MINUTES.   oxybutynin (DITROPAN) 5 MG tablet Take 5 mg by mouth daily.   oxyCODONE-acetaminophen (PERCOCET) 7.5-325 MG tablet Take 1 tablet by mouth 3 (three) times daily as needed.   pantoprazole (PROTONIX) 40 MG tablet Take 40 mg by mouth 2 (two) times daily.   RESTASIS 0.05 % ophthalmic emulsion Place 1 drop into both eyes 2 (two) times  daily.   valACYclovir (VALTREX) 500 MG tablet Take 1 tablet by mouth daily.   vitamin C (ASCORBIC ACID) 500 MG tablet Take 500 mg by mouth daily.   verapamil (CALAN-SR) 240 MG CR tablet Take 1 tablet (240 mg total) by mouth at bedtime.    Social History   Socioeconomic History   Marital status: Single    Spouse name: Not on file   Number of children: 1   Years of education: Not on file   Highest education level: Not on file  Occupational History   Occupation: disabled  Tobacco Use   Smoking status: Every Day    Current packs/day: 0.50    Average packs/day: 0.5 packs/day for 49.1 years (24.5 ttl pk-yrs)    Types: Cigarettes    Start date: 06/10/1974   Smokeless tobacco: Never  Vaping Use   Vaping status: Never Used  Substance and Sexual Activity   Alcohol use: No    Alcohol/week: 0.0 standard drinks of alcohol    Comment: Former user, heavy etoh use prior to 1990s.   Drug use: Not  Currently    Types: Marijuana, Cocaine    Comment: 04/13/19-no cocaine; denies marijuana   Sexual activity: Not on file  Other Topics Concern   Not on file  Social History Narrative   ** Merged History Encounter **       Social Determinants of Health   Financial Resource Strain: Low Risk  (01/21/2023)   Received from [facility]   Overall Financial Resource Strain (CARDIA)    Difficulty of Paying Living Expenses: Not hard at all  Food Insecurity: No Food Insecurity (01/21/2023)   Received from [facility], [facility]   Hunger Vital Sign    Worried About Running Out of Food in the Last Year: Never true    Ran Out of Food in the Last Year: Never true  Transportation Needs: No Transportation Needs (01/21/2023)   Received from [facility], [facility]   PRAPARE - Transportation    Lack of Transportation (Medical): No    Lack of Transportation (Non-Medical): No  Physical Activity: Not on file  Stress: Stress Concern Present (07/11/2022)   Received from [facility]   [facility] of Occupational Health - Occupational Stress Questionnaire    Feeling of Stress : To some extent  Social Connections: Unknown (03/28/2022)   Received from [facility], [facility]   Social Network    Social Network: Not on file     Family History: The patient's family history includes Alcohol abuse in her brother, brother, and sister; Anxiety disorder in her daughter; Breast cancer in her paternal aunt; Clotting disorder in her sister; Colon cancer (age of onset: 36) in her paternal uncle; Coronary artery disease in her mother; Diabetes in her brother, brother, and mother; Drug abuse in her brother, brother, and sister; Fibromyalgia in her daughter; Heart attack in her mother; Heart disease in her brother; Liver cancer in her sister; Obesity in her brother; Ovarian cancer in her maternal aunt; Stomach cancer (age of onset: 74) in her maternal aunt; Stomach  cancer (age of onset: 88) in her paternal grandmother; Thyroid disease in her daughter.  ROS:    Please see the history of present illness.    All other systems reviewed and are negative.  EKGs/Labs/Other Studies Reviewed:    The following studies were reviewed today:   EKG:  EKG is not ordered today.    Echocardiogram on 12/23/2022: 1.  Left ventricular ejection fraction, by estimation, is 65 to 70%. The  left ventricle has normal function. The left ventricle has no regional  wall motion abnormalities. Left ventricular diastolic parameters are  indeterminate. Elevated left atrial  pressure. The average left ventricular global longitudinal strain is -21.2  %. The global longitudinal strain is normal.   2. Right ventricular systolic function is normal. The right ventricular  size is normal.   3. The mitral valve is normal in structure. No evidence of mitral valve  regurgitation. No evidence of mitral stenosis.   4. The aortic valve is tricuspid. There is mild calcification of the  aortic valve. There is mild thickening of the aortic valve. Aortic valve  regurgitation is not visualized. No aortic stenosis is present.   5. The inferior vena cava is normal in size with greater than 50%  respiratory variability, suggesting right atrial pressure of 3 mmHg.   Comparison(s): Echocardiogram done 10/01/12 showed an Ef of 60-65%.  Bilateral carotid duplex on August 30, 2021: IMPRESSION: Minimal amount of bilateral atherosclerotic plaque, right greater than left, not resulting in a hemodynamically significant stenosis within either internal carotid artery.    Coronary CTA on May 20, 2019: IMPRESSION: 1. Severe CAD in the distal RCA and proximal-distal circumflex artery, CADRADS = 4. CT FFR analysis will be performed and reported separately.   2. The patient's coronary artery calcium score is 443, which places the patient in the 74 percentile for age and sex matched control.   3.  Normal coronary origin with right dominance.   Recent Labs: No results found for requested labs within last 365 days.  Recent Lipid Panel No results found for: "CHOL", "TRIG", "HDL", "CHOLHDL", "VLDL", "LDLCALC", "LDLDIRECT"    Physical Exam:    VS:  There were no vitals taken for this visit.    Wt Readings from Last 3 Encounters:  01/16/23 129 lb 3.2 oz (58.6 kg)  12/16/22 132 lb 9.6 oz (60.1 kg)  12/06/22 131 lb 9.6 oz (59.7 kg)     GEN: Well nourished, well developed in no acute distress HEENT: Normal NECK: No JVD; No carotid bruits CARDIAC: S1/S2, RRR, no murmurs, rubs, gallops; 2+ pulses RESPIRATORY:  Clear and diminished to auscultation without rales, wheezing or rhonchi  MUSCULOSKELETAL:  Nonpitting edema along BLE; No deformity  SKIN: Warm and dry NEUROLOGIC:  Alert and oriented x 3 PSYCHIATRIC:  Nervous, calm during interview  ASSESSMENT:    No diagnosis found.   PLAN:    In order of problems listed above:  CAD Patient is a difficult historian. Denies any chest pain. Coronary CTA in 2020 revealed severe CAD in his RCA and proximal and distal circumflex artery, coronary artery calcium score was 443.  FFR analysis demonstrated possible hemodynamically significant lesion distal LAD.  Vessel diameter just proximal to the lesion was 2.65 mm.  It was recommended to consider aggressive medical management.  Was previously ordered NST in past, however she declined.  Was recommended to consider cardiac catheterization for definitive evaluation in the future for chest pain. She has allergies to several antianginals, cannot tolerate Amlodipine, Ranexa, or Imdur. Continue aspirin, atorvastatin, and nitroglycerin as needed. Heart healthy diet and regular cardiovascular exercise encouraged.   Hypertension BP on arrival 154/80, repeat BP 162/82.  Discussed to monitor BP at home at least 2 hours after medications and sitting for 5-10 minutes. No bradycardia today. Heart healthy  diet and regular cardiovascular exercise encouraged. Will increase verapamil. Continue rest of current medication regimen.  Consider referral to advanced HTN in future if BP poorly controlled.   3. Hyperlipidemia Managed by PCP. Continue atorvastatin. Heart healthy diet and regular cardiovascular exercise encouraged.   COPD Denies any recent [facility]. Continue to follow with PCP.  5. Leg edema Improved leg swelling.  Low salt/heart healthy diet, and leg elevation discussed.    5. Disposition: Follow-up with Dr. Diona Browner or APP in 6 months or sooner if anything changes.   Medication Adjustments/Labs and Tests Ordered: Current medicines are reviewed at length with the patient today.  Concerns regarding medicines are outlined above.  No orders of the defined types were placed in this encounter.  No orders of the defined types were placed in this encounter.   There are no Patient Instructions on file for this visit.    Signed, Sharlene Dory, NP  07/11/2023 8:25 AM    [facility]

## 2023-08-08 ENCOUNTER — Telehealth: Payer: Self-pay | Admitting: Cardiology

## 2023-08-08 NOTE — Telephone Encounter (Signed)
Pt c/o BP issue: STAT if pt c/o blurred vision, one-sided weakness or slurred speech  1. What are your last 5 BP readings? 177/88  2. Are you having any other symptoms (ex. Dizziness, headache, blurred vision, passed out)? Blurred vision, weak, SOB  3. What is your BP issue? Pt states that BP has been elevated and needs to be seen   Call transferred

## 2023-08-08 NOTE — Telephone Encounter (Signed)
Spoke with patient concerning elevated BP - states that she thinks it was 177/88 but not sure that she is doing it correctly.  States she also feels a numb feeling in her head, some blurred vison, and SOB x 1 month.  Does not have a log of any other BP readings, states she thinks its up & questions increasing her Verapamil again.  Patient has had multiple appointments scheduled x 4 but states her maintenance man is cancelling these appointments - getting into her apartment and messing around with her things.  OV scheduled for 09/02/2023 with Sharlene Dory, NP in [place] office.  In the meantime, suggest she be seen by urgent care or ED today to evaluate her symptoms.  Also, informed her to start keeping log of her readings so we can have more data to work off of - would not want to make changes to her medication if not sure of her readings.  She verbalized understanding.

## 2023-08-21 ENCOUNTER — Ambulatory Visit: Payer: Medicaid Other | Admitting: Nurse Practitioner

## 2023-09-02 ENCOUNTER — Ambulatory Visit: Payer: Medicaid Other | Attending: Nurse Practitioner | Admitting: Nurse Practitioner

## 2023-09-02 ENCOUNTER — Encounter: Payer: Self-pay | Admitting: Nurse Practitioner

## 2023-09-02 VITALS — BP 190/100 | HR 68 | Ht <= 58 in | Wt 123.0 lb

## 2023-09-02 DIAGNOSIS — F439 Reaction to severe stress, unspecified: Secondary | ICD-10-CM

## 2023-09-02 DIAGNOSIS — I25119 Atherosclerotic heart disease of native coronary artery with unspecified angina pectoris: Secondary | ICD-10-CM

## 2023-09-02 DIAGNOSIS — R0609 Other forms of dyspnea: Secondary | ICD-10-CM

## 2023-09-02 DIAGNOSIS — E785 Hyperlipidemia, unspecified: Secondary | ICD-10-CM | POA: Diagnosis not present

## 2023-09-02 DIAGNOSIS — J449 Chronic obstructive pulmonary disease, unspecified: Secondary | ICD-10-CM

## 2023-09-02 DIAGNOSIS — I1A0 Resistant hypertension: Secondary | ICD-10-CM | POA: Diagnosis not present

## 2023-09-02 DIAGNOSIS — Z720 Tobacco use: Secondary | ICD-10-CM

## 2023-09-02 DIAGNOSIS — I2510 Atherosclerotic heart disease of native coronary artery without angina pectoris: Secondary | ICD-10-CM

## 2023-09-02 DIAGNOSIS — Z79899 Other long term (current) drug therapy: Secondary | ICD-10-CM | POA: Diagnosis not present

## 2023-09-02 MED ORDER — VERAPAMIL HCL ER 360 MG PO CP24: 360.0000 mg | ORAL_CAPSULE | Freq: Every day | ORAL | 5 refills | Status: DC

## 2023-09-02 NOTE — Progress Notes (Unsigned)
Cardiology Office Note:    Date: 09/02/2023 ID:  Victoria Townsend, DOB [birth year], MRN [id number] PCP:  Catalina Lunger, DO [facility] Providers Cardiologist:  Nona Dell, MD    Referring MD: Catalina Lunger, DO   CC: Here for follow-up  History of Present Illness:    Victoria Townsend is a 62 y.o. female with a PMH of recurrent chest pain, CAD, HTN, HLD, tobacco abuse, anxiety, PTSD, and COPD, who presents today for scheduled follow-up.   In 2020, had coronary CTA performed that revealed severe stenosis along distal LAD and distal RCA with aggressive medical management recommended.   In May 2023 she noted numbness along left side of face, intermittent tightness along her back and chest.  Evaluated by EMS, BP elevated.  Patient reported at that time exposure to black mold in her home and thought this was contributing to her symptoms.  Verapamil discontinued, started on bisoprolol 10 mg daily and was restarted on Ranexa 500 mg twice daily, previously tolerant to Imdur.  Exercise Myoview was recommended for further evaluation, however this was not performed.  She was ultimately started on amlodipine 5 mg daily.  She did contact the office in the interim reporting chest pain that was recurrent, ED evaluation was suggested and recommended, but was not pursued.  At follow-up with Randall An, PA-C in August 2023, Norvasc was increased to 10 mg daily for exertional chest pain.  It was suggested that she continues to have chest pain in the future, will need to consider cardiac catheterization for definitive evaluation as she previously declined NST.  Was smoking 1 pack/day.  Last seen by Dr. Diona Browner on August 28, 2022.  Dr. Diona Browner reviewed her medications and noted that she did not tolerate Imdur or Ranexa in the past.  Dr. Diona Browner recommended continuing medical therapy and risk factor modification for CAD.    I last saw patient in office for follow-up on 01/16/2023. Was doing better  from prior office visit. Swelling had improved. Denied any chest pain, shortness of breath, palpitations, syncope, presyncope, dizziness, orthopnea, PND, swelling or significant weight changes, acute bleeding, or claudication. Verapamil increased for better BP control. Patient has multiple medication intolerances.   Today she presents for follow-up. She is a poor historian. Distracted throughout interview. Admits to stress. Says she believes her maintenance man at her apartment complex is canceling her appointments and says she "feels like some of my stuff has been stolen." Reports personal stress. Says "daughter has turned against me. She doesn't call me, I don't get to see my grandchildren, and she doesn't check on me." Sees Dr. Betti Cruz (Psychiatrist). Also sees pain management specialist. Says she has upcoming lung scan to see if she has cancer. Says she is still smoking 1 PPD. Patient does not bring a BP log for office visit. Checked BP earlier today with SBP around 169. Has BP cuff but admits to difficulty with checking her BP's. Denies any recent CP, SHOB, palpitations, syncope, presyncope, dizziness, orthopnea, PND, swelling or significant weight changes, acute bleeding, or claudication.    ROS:    Please see the history of present illness.    All other systems reviewed and are negative.  EKGs/Labs/Other Studies Reviewed:    The following studies were reviewed today:   EKG:  EKG is not ordered today.    Echocardiogram on 12/23/2022: 1. Left ventricular ejection fraction, by estimation, is 65 to 70%. The  left ventricle has normal function. The left ventricle has no regional  wall motion abnormalities. Left ventricular diastolic parameters are  indeterminate. Elevated left atrial  pressure. The average left ventricular global longitudinal strain is -21.2  %. The global longitudinal strain is normal.   2. Right ventricular systolic function is normal. The right ventricular  size is normal.    3. The mitral valve is normal in structure. No evidence of mitral valve  regurgitation. No evidence of mitral stenosis.   4. The aortic valve is tricuspid. There is mild calcification of the  aortic valve. There is mild thickening of the aortic valve. Aortic valve  regurgitation is not visualized. No aortic stenosis is present.   5. The inferior vena cava is normal in size with greater than 50%  respiratory variability, suggesting right atrial pressure of 3 mmHg.   Comparison(s): Echocardiogram done 10/01/12 showed an Ef of 60-65%.  Bilateral carotid duplex on August 30, 2021: IMPRESSION: Minimal amount of bilateral atherosclerotic plaque, right greater than left, not resulting in a hemodynamically significant stenosis within either internal carotid artery.    Coronary CTA on May 20, 2019: IMPRESSION: 1. Severe CAD in the distal RCA and proximal-distal circumflex artery, CADRADS = 4. CT FFR analysis will be performed and reported separately.   2. The patient's coronary artery calcium score is 443, which places the patient in the 15 percentile for age and sex matched control.   3. Normal coronary origin with right dominance.  Physical Exam:    VS:  BP (!) 180/80 (BP Location: Right Arm, Patient Position: Sitting, Cuff Size: Normal)   Pulse 68   Ht 4\' 10"  (1.473 m)   Wt 123 lb (55.8 kg)   SpO2 97%   BMI 25.71 kg/m     Wt Readings from Last 3 Encounters:  09/02/23 123 lb (55.8 kg)  01/16/23 129 lb 3.2 oz (58.6 kg)  12/16/22 132 lb 9.6 oz (60.1 kg)     GEN: Well nourished, well developed in no acute distress HEENT: Normal NECK: No JVD; No carotid bruits CARDIAC: S1/S2, RRR, no murmurs, rubs, gallops; 2+ pulses RESPIRATORY:  Clear and diminished to auscultation without rales, wheezing or rhonchi  MUSCULOSKELETAL:  No edema along BLE; No deformity  SKIN: Warm and dry NEUROLOGIC:  Alert and oriented x 3 PSYCHIATRIC:  Calm, distracted during interview  ASSESSMENT &  PLAN:    In order of problems listed above:  CAD Patient is a difficult historian. Denies any recent chest pain. Coronary CTA in 2020 revealed severe CAD in his RCA and proximal and distal circumflex artery, coronary artery calcium score was 443.  FFR analysis demonstrated possible hemodynamically significant lesion distal LAD.  Vessel diameter just proximal to the lesion was 2.65 mm.  It was recommended to consider aggressive medical management.  Was previously ordered NST in past, however she declined.  Was recommended to consider cardiac catheterization for definitive evaluation in the future for chest pain. She has allergies to several antianginals, cannot tolerate Amlodipine, Ranexa, or Imdur. Continue aspirin, atorvastatin, and nitroglycerin as needed. Heart healthy diet and regular cardiovascular exercise encouraged.   Resistant hypertension, medication management BP elevated today, reports SBP around 169 earlier today.  Discussed to monitor BP at home at least 2 hours after medications and sitting for 5-10 minutes.  Heart healthy diet and regular cardiovascular exercise encouraged. Will increase verapamil and will obtain CMET. Continue rest of current medication regimen. Will bring back in 1-2 weeks for nurse visit for BP check, requested she bring her BP cuff for calibration.  - Intolerant  to Amlodipine, Bisoprolol (caused bradycardia), and Imdur.  - If SBP is not at goal at nurse visit, consider low dose ARB.   3. Hyperlipidemia Managed by PCP. Continue atorvastatin. Heart healthy diet and regular cardiovascular exercise encouraged.   4. COPD Denies any recent SHOB. Continue to follow with PCP.  5.  Tobacco abuse Smoking cessation encouraged and discussed.   6. Stress  Patient reports multiple concerns regarding housing and family stress. Pt believes this is contributing to her elevated BP. Denies any red flag signs or symptoms. Encouraged her to continue to follow-up with PCP and  Psychiatrist. Will notify LCSW to see if there is any additional assistance that can be provided to patient.     Disposition: Patient is requesting to follow-up with Dr. Diona Browner as scheduled. Follow-up with Dr. Diona Browner as scheduled or follow-up with me or APP sooner if anything changes.   Medication Adjustments/Labs and Tests Ordered: Current medicines are reviewed at length with the patient today.  Concerns regarding medicines are outlined above.  Orders Placed This Encounter  Procedures   Comprehensive Metabolic Panel (CMET)   Meds ordered this encounter  Medications   verapamil (VERELAN) 360 MG 24 hr capsule    Sig: Take 1 capsule (360 mg total) by mouth at bedtime.    Dispense:  30 capsule    Refill:  5    Patient Instructions  Medication Instructions:  Your physician has recommended you make the following change in your medication:  Please increase Verapamil 360 Mg daily  Continue all other medications as prescribed    Labwork: CMET in 1-2 weeks at [place]   Testing/Procedures: None   Follow-Up: Your physician recommends that you schedule a follow-up appointment in: Nurse visit in 1-2 weeks for BP check (please bring monitor to be calibrated.) Keep follow up with SM   Any Other Special Instructions Will Be Listed Below (If Applicable).  If you need a refill on your cardiac medications before your next appointment, please call your pharmacy.    Signed, Sharlene Dory, NP

## 2023-09-02 NOTE — Patient Instructions (Addendum)
Medication Instructions:  Your physician has recommended you make the following change in your medication:  Please increase Verapamil 360 Mg daily  Continue all other medications as prescribed    Labwork: CMET in 1-2 weeks at [place]   Testing/Procedures: None   Follow-Up: Your physician recommends that you schedule a follow-up appointment in: Nurse visit in 1-2 weeks for BP check (please bring monitor to be calibrated.) Keep follow up with SM   Any Other Special Instructions Will Be Listed Below (If Applicable).  If you need a refill on your cardiac medications before your next appointment, please call your pharmacy.

## 2023-09-03 ENCOUNTER — Ambulatory Visit: Payer: Medicaid Other | Admitting: "Endocrinology

## 2023-09-16 ENCOUNTER — Ambulatory Visit: Payer: Medicaid Other

## 2023-09-16 ENCOUNTER — Telehealth: Payer: Self-pay | Admitting: *Deleted

## 2023-09-16 NOTE — Telephone Encounter (Signed)
PT is calling about a LCS. Referral should come in any day. [phone]

## 2023-09-17 ENCOUNTER — Other Ambulatory Visit: Payer: Self-pay

## 2023-09-17 DIAGNOSIS — F17210 Nicotine dependence, cigarettes, uncomplicated: Secondary | ICD-10-CM

## 2023-09-17 DIAGNOSIS — Z122 Encounter for screening for malignant neoplasm of respiratory organs: Secondary | ICD-10-CM

## 2023-09-17 DIAGNOSIS — Z87891 Personal history of nicotine dependence: Secondary | ICD-10-CM

## 2023-09-17 NOTE — Telephone Encounter (Signed)
Previous  LCS pt. SDMV completed in 2020 and next annual LDCT due 01/2024.  Order has been placed for scheduling.  Attempt to call patient but no answer and VM is full.

## 2023-09-18 ENCOUNTER — Ambulatory Visit: Payer: Medicaid Other | Admitting: Nurse Practitioner

## 2023-09-24 ENCOUNTER — Ambulatory Visit: Payer: Medicaid Other

## 2023-09-24 ENCOUNTER — Telehealth: Payer: Self-pay | Admitting: Cardiology

## 2023-09-24 NOTE — Telephone Encounter (Signed)
Pt called to cx a nurse visit for today, Requesting cb to reschedule

## 2023-09-24 NOTE — Telephone Encounter (Signed)
Returned pt's call to r/s apt- no answer, left message to return call

## 2023-09-25 NOTE — Telephone Encounter (Signed)
Pt is returning call to the nurse

## 2023-10-02 ENCOUNTER — Telehealth: Payer: Self-pay | Admitting: Cardiology

## 2023-10-02 NOTE — Telephone Encounter (Signed)
Patient is requesting call back to discuss appt with Dr. Diona Browner. States she needs a later in day appt. Requesting call back around 4 P.M.

## 2023-10-09 NOTE — Telephone Encounter (Signed)
Pt requesting a later appt d/t not being able to get up early in the morning to ride the bus. Pt prefers afternoon appt.

## 2023-10-09 NOTE — Telephone Encounter (Signed)
Patient states she is returning call in regards to the Dr. Diona Browner appt. Please advise.

## 2023-10-13 ENCOUNTER — Other Ambulatory Visit: Payer: Self-pay

## 2023-10-13 ENCOUNTER — Emergency Department (HOSPITAL_COMMUNITY): Payer: Medicaid Other

## 2023-10-13 ENCOUNTER — Emergency Department (HOSPITAL_COMMUNITY)
Admission: EM | Admit: 2023-10-13 | Discharge: 2023-10-13 | Discharge status: Against Medical Advice | Payer: Medicaid Other | Attending: Emergency Medicine | Admitting: Emergency Medicine

## 2023-10-13 ENCOUNTER — Encounter (HOSPITAL_COMMUNITY): Payer: Self-pay

## 2023-10-13 DIAGNOSIS — R1012 Left upper quadrant pain: Secondary | ICD-10-CM | POA: Diagnosis present

## 2023-10-13 DIAGNOSIS — Z5321 Procedure and treatment not carried out due to patient leaving prior to being seen by health care provider: Secondary | ICD-10-CM | POA: Insufficient documentation

## 2023-10-13 LAB — COMPREHENSIVE METABOLIC PANEL
ALT: 14 U/L (ref 0–44)
AST: 25 U/L (ref 15–41)
Albumin: 4 g/dL (ref 3.5–5.0)
Alkaline Phosphatase: 56 U/L (ref 38–126)
Anion gap: 7 (ref 5–15)
BUN: 11 mg/dL (ref 8–23)
CO2: 27 mmol/L (ref 22–32)
Calcium: 9.8 mg/dL (ref 8.9–10.3)
Chloride: 99 mmol/L (ref 98–111)
Creatinine, Ser: 0.81 mg/dL (ref 0.44–1.00)
GFR, Estimated: >60 mL/min (ref >60)
Glucose, Bld: 81 mg/dL (ref 70–99)
Potassium: 3.5 mmol/L (ref 3.5–5.1)
Sodium: 133 mmol/L — ABNORMAL LOW (ref 135–145)
Total Bilirubin: 0.6 mg/dL (ref <1.2)
Total Protein: 7 g/dL (ref 6.5–8.1)

## 2023-10-13 LAB — URINALYSIS, ROUTINE W REFLEX MICROSCOPIC
Bacteria, UA: NONE SEEN
Bilirubin Urine: NEGATIVE
Glucose, UA: NEGATIVE mg/dL
Hgb urine dipstick: NEGATIVE
Ketones, ur: NEGATIVE mg/dL
Nitrite: NEGATIVE
Protein, ur: NEGATIVE mg/dL
Specific Gravity, Urine: 1.004 — ABNORMAL LOW (ref 1.005–1.030)
pH: 5 (ref 5.0–8.0)

## 2023-10-13 LAB — CBC
HCT: 36.8 % (ref 36.0–46.0)
Hemoglobin: 12.5 g/dL (ref 12.0–15.0)
MCH: 34 pg (ref 26.0–34.0)
MCHC: 34 g/dL (ref 30.0–36.0)
MCV: 100 fL (ref 80.0–100.0)
Platelets: 271 10*3/uL (ref 150–400)
RBC: 3.68 MIL/uL — ABNORMAL LOW (ref 3.87–5.11)
RDW: 12.3 % (ref 11.5–15.5)
WBC: 7.4 10*3/uL (ref 4.0–10.5)
nRBC: 0 % (ref 0.0–0.2)

## 2023-10-13 LAB — LIPASE, BLOOD: Lipase: 35 U/L (ref 11–51)

## 2023-10-13 NOTE — ED Triage Notes (Signed)
Pt states that she jumped down an embankment and was seen at [facility] and had a xray done of her left. Pt states she feels like her "inside have rolled up inside" and that she is having LUQ pain that radiates to her left side.

## 2023-10-16 ENCOUNTER — Ambulatory Visit: Payer: Medicaid Other | Attending: Cardiology | Admitting: *Deleted

## 2023-10-16 VITALS — BP 166/93 | HR 75

## 2023-10-16 DIAGNOSIS — Z79899 Other long term (current) drug therapy: Secondary | ICD-10-CM

## 2023-10-16 DIAGNOSIS — I1A0 Resistant hypertension: Secondary | ICD-10-CM

## 2023-10-16 NOTE — Progress Notes (Signed)
Thank you for seeing Victoria Townsend.  Her systolic BP reading is better than it was at previous office visit with me.  However, her SBP is still not at goal.  SBP goal for her is < 140.   Her medical therapy is limited due to her multiple medication intolerances, please see her allergy list. I recommend starting losartan 25 mg daily and obtaining a BMET in 1 week.   Please write a Rx for new BP cuff for Victoria Townsend if her insurance will cover.  Follow-up as scheduled.   Thank you!   Best,  Sharlene Dory, NP

## 2023-10-16 NOTE — Progress Notes (Signed)
Patient in office for nurse BP check -   144/86  80  97% - nurse check  166/93  75 -  her monitor   Suggested that her monitor may not be accurate due to the wide range between the two BP checks.  States that our office gave her the monitor that she currently has & can not afford to purchase a new one.  Suggested we check with her pharmacy to see if her insurance may cover a new monitor for her.    Is scheduled to see Dr. Diona Browner on 12/22/2023.

## 2023-10-17 ENCOUNTER — Ambulatory Visit: Payer: Medicaid Other | Admitting: Cardiology

## 2023-10-21 MED ORDER — OMRON 3 SERIES BP MONITOR DEVI: 0 refills | Status: AC

## 2023-10-21 MED ORDER — LOSARTAN POTASSIUM 25 MG PO TABS: 25.0000 mg | ORAL_TABLET | Freq: Every day | ORAL | 6 refills | Status: DC

## 2023-10-21 NOTE — Progress Notes (Signed)
Patient notified and verbalized understanding.   New medication & BP cuff sent to [facility].  Lab order [facility]) faxed to pcp for her to do there on 10/27/23 at her request as she has visit there that day.

## 2023-10-21 NOTE — Addendum Note (Signed)
Addended by: Lesle Chris on: 10/21/2023 04:59 PM   Modules accepted: Orders

## 2023-10-22 ENCOUNTER — Telehealth: Payer: Self-pay | Admitting: Cardiology

## 2023-10-22 NOTE — Telephone Encounter (Signed)
Pt c/o medication issue:  1. Name of Medication:   verapamil (VERELAN) 360 MG 24 hr capsule    losartan (COZAAR) 25 MG tablet    2. How are you currently taking this medication (dosage and times per day)? As written  3. Are you having a reaction (difficulty breathing--STAT)? No  4. What is your medication issue? Patient stated she does not what to take Losartan. Patient would like to only take the Verapamil instead. Please advise.

## 2023-10-22 NOTE — Telephone Encounter (Signed)
No answer, No voicemail set up will continue to try and contact patient to figure out why she is requesting this

## 2023-10-27 NOTE — Telephone Encounter (Signed)
Patient states she needs to reschedule her appointment with Dr.McDowell for earlier in the day due to having [facility] bring and pick her up for her appointment. Patient states she is not going to take losartan she already sent it back to the pharmacy due to all the side effects she has read into, she states she is not going to make any medications changes until after the holidays and she can see Dr.McDowell in person or see a new MD.  Patient informed and verbalized understanding of the importance of adding this new medication but patient still refuses due to possible side effects. And wanting to see MD

## 2023-11-21 ENCOUNTER — Telehealth: Payer: Self-pay | Admitting: Cardiology

## 2023-11-21 NOTE — Telephone Encounter (Signed)
Pt c/o medication issue:  1. Name of Medication: Losarton 25 MG  2. How are you currently taking this medication (dosage and times per day)? NA  3. Are you having a reaction (difficulty breathing--STAT)? No  4. What is your medication issue? Pt states that pharmacy is needing documentation stating that she is no longer taking medication so that they can stop refilling it. Please advise  [facility] - [place], [place] - [address]

## 2023-11-21 NOTE — Telephone Encounter (Signed)
Spoke with Mel Almond at [facility]. Advised her that patient is no longer taking Losartan 25 mg and medication is no longer on her medication list. She verbalized understanding

## 2023-11-28 ENCOUNTER — Telehealth: Payer: Self-pay | Admitting: Cardiology

## 2023-11-28 NOTE — Telephone Encounter (Signed)
 Patient states she has been feeling weak and has had a lot of acid reflux and her left arm hurts How long has this been going on? Just this morning  Symptoms? Patient states she only had chest pain this morning and she feels it was acid reflux  Pain scale? 10  Patient took 1 NTG and pain went away  Anything you would like to add? She stated she ate watermelon too and this seemed to help.  Patient states she has no BP readings to give me Patient medications have been reviewed  Patient states if it happens again she will take her antiacids and NTG and will proceed to ED to be evaluated

## 2023-11-28 NOTE — Telephone Encounter (Signed)
   Pt c/o of Chest Pain: STAT if active CP, including tightness, pressure, jaw pain, radiating pain to shoulder/upper arm/back, CP unrelieved by Nitro. Symptoms reported of SOB, nausea, vomiting, sweating.  1. Are you having CP right now? no    2. Are you experiencing any other symptoms (ex. SOB, nausea, vomiting, sweating)? Nausea,weak feeling   3. Is your CP continuous or coming and going? Coming and going    4. Have you taken Nitroglycerin ? yes   5. How long have you been experiencing CP? This morning    6. If NO CP at time of call then end call with telling Pt to call back or call 911 if Chest pain returns prior to return call from triage team.

## 2023-12-17 ENCOUNTER — Ambulatory Visit: Payer: Medicaid Other | Admitting: "Endocrinology

## 2023-12-17 ENCOUNTER — Other Ambulatory Visit: Payer: Self-pay

## 2023-12-17 DIAGNOSIS — Z87891 Personal history of nicotine dependence: Secondary | ICD-10-CM

## 2023-12-17 DIAGNOSIS — Z122 Encounter for screening for malignant neoplasm of respiratory organs: Secondary | ICD-10-CM

## 2023-12-22 ENCOUNTER — Ambulatory Visit: Payer: MEDICAID | Attending: Cardiology | Admitting: Cardiology

## 2023-12-22 ENCOUNTER — Encounter: Payer: Self-pay | Admitting: Cardiology

## 2023-12-22 VITALS — BP 116/70 | HR 83 | Ht <= 58 in | Wt 127.2 lb

## 2023-12-22 DIAGNOSIS — I10 Essential (primary) hypertension: Secondary | ICD-10-CM | POA: Diagnosis not present

## 2023-12-22 DIAGNOSIS — I25119 Atherosclerotic heart disease of native coronary artery with unspecified angina pectoris: Secondary | ICD-10-CM | POA: Diagnosis not present

## 2023-12-22 DIAGNOSIS — I2510 Atherosclerotic heart disease of native coronary artery without angina pectoris: Secondary | ICD-10-CM

## 2023-12-22 DIAGNOSIS — F17201 Nicotine dependence, unspecified, in remission: Secondary | ICD-10-CM | POA: Diagnosis not present

## 2023-12-22 DIAGNOSIS — E782 Mixed hyperlipidemia: Secondary | ICD-10-CM | POA: Diagnosis not present

## 2023-12-22 NOTE — Patient Instructions (Addendum)

## 2023-12-22 NOTE — Progress Notes (Signed)
    Cardiology Office Note  Date: 12/22/2023   ID: Victoria Townsend, DOB 08/03/1961, MRN [id number]  History of Present Illness: Victoria Townsend is a 63 y.o. female last seen in October 2024 by Ms. Peck NP, I reviewed the note (our last visit was in 2023).  She is here for a follow-up visit.  Reports using to supplement nitroglycerin in the last 3 months.  Episodes of discomfort she describes are still fairly atypical, possibly inflammatory in etiology.  She is convinced that it is related to an abdominal hernia.  She is now following at the [facility] clinic.  I reviewed her medications.  Current regimen includes aspirin, Lipitor, Zestoretic, and as needed nitroglycerin.  Blood pressure is well-controlled today.  She has intolerances to amlodipine, bisoprolol, Lasix, Ranexa, and Imdur.  I reviewed her ECG today which shows normal sinus rhythm.  Physical Exam: VS:  BP 116/70   Pulse 83   Ht 4\' 10"  (1.473 m)   Wt 127 lb 3.2 oz (57.7 kg)   SpO2 97%   BMI 26.58 kg/m , BMI Body mass index is 26.58 kg/m.  Wt Readings from Last 3 Encounters:  12/22/23 127 lb 3.2 oz (57.7 kg)  10/13/23 12 lb 4.8 oz (5.579 kg)  09/02/23 123 lb (55.8 kg)    General: Patient appears comfortable at rest. HEENT: Conjunctiva and lids normal. Neck: Supple, no elevated JVP or carotid bruits. Lungs: Clear to auscultation, nonlabored breathing at rest. Cardiac: Regular rate and rhythm, no S3 or significant systolic murmur. Extremities: Mild ankle edema.  ECG:  An ECG dated 12/06/2022 was personally reviewed today and demonstrated:  Sinus bradycardia with R' in lead V1.  Labwork: 10/13/2023: ALT 14; AST 25; BUN 11; Creatinine, Ser 0.81; Hemoglobin 12.5; Platelets 271; Potassium 3.5; Sodium 133   Other Studies Reviewed Today:  No interval cardiac testing for review today.  Assessment and Plan:  1.  CAD by coronary CTA in 2020 with no significant stenosis involving the distal LAD in small caliber  segment best managed medically.  CT FFR did not indicate any additional obstructive stenoses at that time.  LVEF 65 to 70% by echocardiogram in January 2024.  Plan to continue medical therapy at this time.  She does have several medication intolerances limiting treatment options.  Continue aspirin, Lipitor, and as needed nitroglycerin.  2.  Primary hypertension.  Blood pressure well-controlled today.  She is now on Zestoretic as well per PCP.  3.  Mixed hyperlipidemia.  Recommend recheck FLP at PCP.  She is continuing on Lipitor.  4.  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 She states that she quit smoking three weeks ago.  Disposition:  Follow up  6 months.  Signed, Jonelle Sidle, M.D., F.A.C.C. [facility] at [facility]

## 2023-12-24 ENCOUNTER — Telehealth: Payer: Self-pay | Admitting: Cardiology

## 2023-12-24 MED ORDER — LISINOPRIL-HYDROCHLOROTHIAZIDE 20-25 MG PO TABS: 1.0000 | ORAL_TABLET | Freq: Every day | ORAL | 6 refills | Status: DC

## 2023-12-24 NOTE — Telephone Encounter (Signed)
Pt c/o medication issue:  1. Name of Medication: lisinopril-hydrochlorothiazide (ZESTORETIC) 20-25 MG tablet   2. How are you currently taking this medication (dosage and times per day)? As written  3. Are you having a reaction (difficulty breathing--STAT)? No   4. What is your medication issue? Pt's PCP's discontinued this medication and she wants [place] to fill it. I told her she needs to talk to PCP and find out why he has discontinued it. She said she cannot get anyone to call her back from his office.

## 2023-12-24 NOTE — Telephone Encounter (Signed)
Advised that refill sent for lisinopril/hydrochlorothiazide 20/25 mg

## 2024-02-09 ENCOUNTER — Telehealth: Payer: Self-pay | Admitting: Cardiology

## 2024-02-09 NOTE — Telephone Encounter (Signed)
   Pt c/o of Chest Pain: STAT if active CP, including tightness, pressure, jaw pain, radiating pain to shoulder/upper arm/back, CP unrelieved by Nitro. Symptoms reported of SOB, nausea, vomiting, sweating.  1. Are you having CP right now? No   2. Are you experiencing any other symptoms (ex. SOB, nausea, vomiting, sweating)? under her left breast is feeling heavy and on and off soreness pains in her chest. Pt also thinks that the medication   lisinopril-hydrochlorothiazide (ZESTORETIC) 20-25 MG tablet might be making the pain worse.     3. Is your CP continuous or coming and going? yes   4. Have you taken Nitroglycerin? Yes 2x   5. How long have you been experiencing CP? 2 days    6. If NO CP at time of call then end call with telling Pt to call back or call 911 if Chest pain returns prior to return call from triage team.

## 2024-02-10 NOTE — Telephone Encounter (Signed)
 Spoke with patient regarding her pain in her chest. Stated that she is supposed to be having surgery. Pain is in left shoulder down to her arm was going under her breast. She believes that it was where she has been carrying heavy bags on that side and walking from the grocery store and back. Reported that the pain has left as of now. Once she is cleared will be having this surgery the second week of April. Advised her if pain comes back to call or go to the ER. Patient verbalized understanding

## 2024-02-11 ENCOUNTER — Ambulatory Visit (HOSPITAL_COMMUNITY)
Admission: RE | Admit: 2024-02-11 | Discharge: 2024-02-11 | Disposition: A | Discharge status: Home / Self Care | Payer: MEDICAID | Source: Ambulatory Visit | Attending: Oncology | Admitting: Oncology

## 2024-02-11 DIAGNOSIS — Z87891 Personal history of nicotine dependence: Secondary | ICD-10-CM | POA: Diagnosis present

## 2024-02-11 DIAGNOSIS — Z122 Encounter for screening for malignant neoplasm of respiratory organs: Secondary | ICD-10-CM | POA: Diagnosis present

## 2024-03-15 NOTE — Progress Notes (Signed)
Patient notified of LDCT Lung Cancer Screening Results via mail with the recommendation to follow-up in 12 months. Patient's referring provider has been sent a copy of results. Results are as follows:  IMPRESSION: 1. Lung-RADS 2, benign appearance or behavior. Continue annual screening with low-dose chest CT without contrast in 12 months. 2. Aortic Atherosclerosis (ICD10-I70.0) and Emphysema (ICD10-J43.9). 3. Age advanced coronary artery atherosclerosis. Recommend assessment of coronary risk factors.

## 2024-03-23 ENCOUNTER — Ambulatory Visit: Admitting: Cardiology

## 2024-03-24 ENCOUNTER — Telehealth: Payer: Self-pay | Admitting: Cardiology

## 2024-03-24 NOTE — Telephone Encounter (Signed)
   Pre-operative Risk Assessment    Patient Name: Victoria Townsend  DOB: 1961/09/23 MRN: [id number]      Request for Surgical Clearance    Procedure:   left Shoulder Replacement   Date of Surgery:  Clearance TBD                                 Surgeon:  Not Indicated Surgeon's Group or Practice Name: [facility] orthopedic Phone number:  [phone] Fax number:  [phone]   Type of Clearance Requested:   - Medical    Type of Anesthesia:  Not Indicated   Additional requests/questions:    [name]   03/24/2024, 5:16 PM

## 2024-03-25 NOTE — Telephone Encounter (Signed)
 Left message to call back to schedule tele pre op appt.

## 2024-03-25 NOTE — Telephone Encounter (Signed)
   Name: Victoria Townsend  DOB: 03-09-1961  MRN: [id number]  Primary Cardiologist: Teddie Favre, MD Last OV: 12/22/23 with Dr. Londa Rival   Preoperative team, please contact this patient and set up a phone call appointment for further preoperative risk assessment. Please obtain consent and complete medication review. Thank you for your help.  I confirm that guidance regarding antiplatelet and oral anticoagulation therapy has been completed and, if necessary, noted below. None requested.   I also confirmed the patient resides in the state of [place] . As per [facility] [place] [facility] [employer] telemedicine laws, the patient must reside in the state in which the provider is licensed.  Tritia Endo D Porchea Charrier, NP 03/25/2024, 9:45 AM [facility]

## 2024-03-26 ENCOUNTER — Telehealth: Payer: Self-pay

## 2024-03-26 NOTE — Telephone Encounter (Signed)
 PT scheduled for Pre OP Tele appt. 04/15/24 Med Rec and consent done  Will put pt on wait list.   Pt had threaten to cancel services with our practice all together. Pt said that if she can not get ortho to give her an injection before surgery she is going to cancel our practice.    Patient Consent for Virtual Visit        JOANIE GOLDING has provided verbal consent on 03/26/2024 for a virtual visit (video or telephone).   CONSENT FOR VIRTUAL VISIT FOR:  Dayle Evan  By participating in this virtual visit I agree to the following:  I hereby voluntarily request, consent and authorize [facility] and its employed or contracted physicians, physician assistants, nurse practitioners or other licensed health care professionals (the Practitioner), to provide me with telemedicine health care services (the "Services") as deemed necessary by the treating Practitioner. I acknowledge and consent to receive the Services by the Practitioner via telemedicine. I understand that the telemedicine visit will involve communicating with the Practitioner through live audiovisual communication technology and the disclosure of certain medical information by electronic transmission. I acknowledge that I have been given the opportunity to request an in-person assessment or other available alternative prior to the telemedicine visit and am voluntarily participating in the telemedicine visit.  I understand that I have the right to withhold or withdraw my consent to the use of telemedicine in the course of my care at any time, without affecting my right to future care or treatment, and that the Practitioner or I may terminate the telemedicine visit at any time. I understand that I have the right to inspect all information obtained and/or recorded in the course of the telemedicine visit and may receive copies of available information for a reasonable fee.  I understand that some of the potential risks of receiving the  Services via telemedicine include:  Delay or interruption in medical evaluation due to technological equipment failure or disruption; Information transmitted may not be sufficient (e.g. poor resolution of images) to allow for appropriate medical decision making by the Practitioner; and/or  In rare instances, security protocols could fail, causing a breach of personal health information.  Furthermore, I acknowledge that it is my responsibility to provide information about my medical history, conditions and care that is complete and accurate to the best of my ability. I acknowledge that Practitioner's advice, recommendations, and/or decision may be based on factors not within their control, such as incomplete or inaccurate data provided by me or distortions of diagnostic images or specimens that may result from electronic transmissions. I understand that the practice of medicine is not an exact science and that Practitioner makes no warranties or guarantees regarding treatment outcomes. I acknowledge that a copy of this consent can be made available to me via my patient portal [facility] MyChart), or I can request a printed copy by calling the office of [facility].    I understand that my insurance will be billed for this visit.   I have read or had this consent read to me. I understand the contents of this consent, which adequately explains the benefits and risks of the Services being provided via telemedicine.  I have been provided ample opportunity to ask questions regarding this consent and the Services and have had my questions answered to my satisfaction. I give my informed consent for the services to be provided through the use of telemedicine in my medical care

## 2024-03-26 NOTE — Telephone Encounter (Signed)
 PT scheduled for Pre OP Tele appt. 04/15/24 Med Rec and consent done  Will put pt on wait list.   Pt had threaten to cancel services with our practice all together. Pt said that if she can not get ortho to give her an injection before surgery she is going to cancel our practice.

## 2024-03-26 NOTE — Telephone Encounter (Signed)
Patient returned Pre-op call. 

## 2024-03-30 ENCOUNTER — Telehealth: Payer: Self-pay | Admitting: Cardiology

## 2024-03-30 NOTE — Telephone Encounter (Signed)
 Called patient and unable to leave voicemail. Voicemail is full

## 2024-03-30 NOTE — Telephone Encounter (Signed)
 Patient called again regarding issues with her lisinopril -hydrochlorothiazide  (ZESTORETIC ) 20-25 MG tablet medication.  Patient wants a call back to discuss alternate medication.

## 2024-03-30 NOTE — Telephone Encounter (Signed)
 Pt c/o medication issue:  1. Name of Medication: Lisinopril  -Hydrochlorothiazide   2. How are you currently taking this medication (dosage and times per day)?   3. Are you having a reaction (difficulty breathing--STAT)?   4. What is your medication issue?  Patient says this medicine is causing her a lot of problems. She says she has burning and stinging in her legs, ankles, also swelling,cloudy urine and urinating about 9 times a night

## 2024-03-30 NOTE — Telephone Encounter (Signed)
 Patient has been on this medication since January. Could this cause these issues after taking this medication for so long?

## 2024-03-31 ENCOUNTER — Other Ambulatory Visit: Payer: Self-pay | Admitting: Cardiology

## 2024-03-31 NOTE — Telephone Encounter (Signed)
 Per Dr. Londa Rival:  Based on the symptoms reported, I think it would be best for her to be evaluated by her PCP or at an urgent care to get a urinalysis and make sure that there is not something else going on.  If she wants to hold lisinopril -HCTZ to see if symptoms improve that would be a first step, although her blood pressure is not going to be as well-controlled and something else may need to be considered.  Unfortunately, this is not going to be easily handled over the phone and she will have to arrange a follow-up visit.   Gave recommendations to patient in regards to medication and going to PCP. She states that she didn't like the one she had and that she needed a new one. Will hold the medication for now to see if her symptoms change. Advised her that someone will call her to make an appointment with either Dr. Londa Rival or Nellie Banas. Also reported she did go to urgent care and did have a UTI was given Keflex for it. Routed to scheduling for an appointment

## 2024-03-31 NOTE — Telephone Encounter (Signed)
 Patient is requesting a new medication since she is having so many issues with current one. (Lisinopril -hydrochlorothiazide ). Stated that she is having swelling in her legs and ankles. Her body is aching especially in her kidneys. Having chills all over. She uses the bathroom frequently, advised her that its due to the fact it has hydrochlorothiazide  in the lisinopril . She stated that she has used the bathroom so much it hurts in her private area. She states that this has been going on for about 2 months. Advised her will send over to provider for further recommendations.

## 2024-03-31 NOTE — Telephone Encounter (Signed)
Patient is calling to follow up. Please advise.

## 2024-04-02 ENCOUNTER — Telehealth: Payer: Self-pay

## 2024-04-02 ENCOUNTER — Ambulatory Visit (INDEPENDENT_AMBULATORY_CARE_PROVIDER_SITE_OTHER)

## 2024-04-02 DIAGNOSIS — Z01810 Encounter for preprocedural cardiovascular examination: Secondary | ICD-10-CM

## 2024-04-02 NOTE — Telephone Encounter (Signed)
 Spoke to patient for her needing to be seen in office for her to have pre-op clearance, she said she will try to make it. Patient has been scheduled on 04/04/2024 at 11:00.

## 2024-04-02 NOTE — Progress Notes (Signed)
   Virtual Visit via Telephone Note   Because of Victoria Townsend's co-morbid illnesses, she is at least at moderate risk for complications without adequate follow up.  This format is felt to be most appropriate for this patient at this time.  The patient did not have access to video technology/had technical difficulties with video requiring transitioning to audio format only (telephone).  All issues noted in this document were discussed and addressed.  No physical exam could be performed with this format.  Please refer to the patient's chart for her consent to telehealth for [facility].  _____________   Date:  04/02/2024   Patient ID:  Victoria Townsend, DOB August 22, 1961, MRN [id number] Patient Location:  Home Provider location:   Office  Primary Care Provider:  Gwenyth Leo, FNP Primary Cardiologist:  Teddie Favre, MD  Contacted patient for scheduled telephone visit. The connection was very poor and I was only able to hear every other word or so. The patient said she could hear me perfectly. From what I could make out she is having side effects from Lisinopril . Based on notes in the system Dr. Londa Rival suggested she stop lisinopril  and be evaluated by PCP or urgent care.   Given communication difficulty with poor connection and problems patient is having I feel she will be best served with evaluation in person. Therefore, call was ended and she will be scheduled for an in-person visit.   Morey Ar, NP  04/02/2024, 7:11 AM

## 2024-04-05 ENCOUNTER — Telehealth: Payer: Self-pay | Admitting: Gastroenterology

## 2024-04-05 ENCOUNTER — Encounter: Payer: Self-pay | Admitting: Nurse Practitioner

## 2024-04-05 ENCOUNTER — Ambulatory Visit: Attending: Nurse Practitioner | Admitting: Nurse Practitioner

## 2024-04-05 ENCOUNTER — Telehealth: Payer: Self-pay | Admitting: Cardiology

## 2024-04-05 VITALS — BP 146/76 | HR 71 | Ht <= 58 in | Wt 133.0 lb

## 2024-04-05 DIAGNOSIS — M79604 Pain in right leg: Secondary | ICD-10-CM | POA: Diagnosis present

## 2024-04-05 DIAGNOSIS — I10 Essential (primary) hypertension: Secondary | ICD-10-CM | POA: Diagnosis present

## 2024-04-05 DIAGNOSIS — I2510 Atherosclerotic heart disease of native coronary artery without angina pectoris: Secondary | ICD-10-CM | POA: Diagnosis present

## 2024-04-05 DIAGNOSIS — E785 Hyperlipidemia, unspecified: Secondary | ICD-10-CM | POA: Diagnosis present

## 2024-04-05 DIAGNOSIS — Z01810 Encounter for preprocedural cardiovascular examination: Secondary | ICD-10-CM | POA: Diagnosis present

## 2024-04-05 DIAGNOSIS — M79605 Pain in left leg: Secondary | ICD-10-CM | POA: Diagnosis present

## 2024-04-05 DIAGNOSIS — J449 Chronic obstructive pulmonary disease, unspecified: Secondary | ICD-10-CM | POA: Diagnosis present

## 2024-04-05 DIAGNOSIS — R600 Localized edema: Secondary | ICD-10-CM | POA: Insufficient documentation

## 2024-04-05 DIAGNOSIS — R0609 Other forms of dyspnea: Secondary | ICD-10-CM

## 2024-04-05 DIAGNOSIS — Z01818 Encounter for other preprocedural examination: Secondary | ICD-10-CM

## 2024-04-05 DIAGNOSIS — Z758 Other problems related to medical facilities and other health care: Secondary | ICD-10-CM

## 2024-04-05 MED ORDER — LISINOPRIL-HYDROCHLOROTHIAZIDE 20-25 MG PO TABS: 1.0000 | ORAL_TABLET | Freq: Every day | ORAL | 6 refills | Status: DC

## 2024-04-05 NOTE — Patient Instructions (Addendum)
 Medication Instructions:  Your physician has recommended you make the following change in your medication:  Please restart Lisinopril -Hydrochlorothiazide  20 mg-25 mg   Labwork: In 1-2 weeks at [facility]   Testing/Procedures: None   Follow-Up: Your physician recommends that you schedule a follow-up appointment in: 6 months   Any Other Special Instructions Will Be Listed Below (If Applicable).  If you need a refill on your cardiac medications before your next appointment, please call your pharmacy.

## 2024-04-05 NOTE — Telephone Encounter (Signed)
 Patient called and stated that she would like to schedule an appointment after her left shoulder replacement. Patient will call back when is feeling better. Please advise.

## 2024-04-05 NOTE — Telephone Encounter (Signed)
 Patient called to report she will keep her current PCP for now.

## 2024-04-05 NOTE — Progress Notes (Unsigned)
 Cardiology Office Note:    Date: 04/05/2024 ID:  Victoria Townsend, DOB 1961-04-29, MRN [id number] PCP:  Gwenyth Leo, FNP [facility] Providers Cardiologist:  Teddie Favre, MD    Referring MD: Gwenyth Leo, FNP   CC: Here for follow-up and pre-operative cardiovascular risk assessment  History of Present Illness:    Victoria Townsend is a 63 y.o. female with a PMH of recurrent chest pain, CAD, HTN, HLD, hx of tobacco abuse, anxiety, PTSD, and COPD, who presents today for scheduled follow-up and pre-operative cardiovascular risk assessment.   In 2020, had coronary CTA performed that revealed severe stenosis along distal LAD and distal RCA with aggressive medical management recommended. Echo 11/2022 revealed EF 65-70%.   Last seen by Dr. Londa Rival on December 22, 2023. She reported using supplemental NTG for past 3 months, reported atypical discomfort, pt convinced it was related to her abdominal hernia.   She is here for follow-up today and for preoperative cardiovascular risk assessment.  Our office has been contacting for clearance.  She is pending left shoulder replacement.  Date of surgery is TBD.  [facility] appears will be doing her surgery. She was initially set up for telephone pre-op clearance, however due to poor connection via telephone, it was felt visit would be best done in person.   Pt is a difficult historian. Today she states she recently stopped her lisinopril -hydrochlorothiazide  since someone stated for her to stop this. Believes she is having a UTI, says she has been on rounds of ABX for this. Admits to issues with her kidneys, leg pains, and intermittent leg swelling. Denies any chest pain, shortness of breath, palpitations, syncope, presyncope, dizziness, orthopnea, PND, swelling or significant weight changes, acute bleeding, or claudication. She expresses issues related to her most recent BP medication she was on as she read about "the awful side effects." Tells  me she has been nicotine free over the past 4 months. Admits to taking NTG but admits to abdominal pain, similar to last office visit, attributes this to her hernia. Also has had issues with getting in to see her PCP. Tells me she is concerned regarding her elevated BP.    ROS:    Please see the history of present illness.    All other systems reviewed and are negative.  EKGs/Labs/Other Studies Reviewed:    The following studies were reviewed today:   EKG:  EKG Interpretation Date/Time:  Monday Apr 05 2024 10:38:37 EDT Ventricular Rate:  72 PR Interval:  142 QRS Duration:  84 QT Interval:  400 QTC Calculation: 438 R Axis:   67  Text Interpretation: Normal sinus rhythm Normal ECG When compared with ECG of 22-Dec-2023 14:54, No significant change was found Confirmed by Lasalle Pointer [phone]) on 04/05/2024 10:59:34 AM    Echocardiogram on 12/23/2022: 1. Left ventricular ejection fraction, by estimation, is 65 to 70%. The  left ventricle has normal function. The left ventricle has no regional  wall motion abnormalities. Left ventricular diastolic parameters are  indeterminate. Elevated left atrial  pressure. The average left ventricular global longitudinal strain is -21.2  %. The global longitudinal strain is normal.   2. Right ventricular systolic function is normal. The right ventricular  size is normal.   3. The mitral valve is normal in structure. No evidence of mitral valve  regurgitation. No evidence of mitral stenosis.   4. The aortic valve is tricuspid. There is mild calcification of the  aortic valve. There is mild thickening of  the aortic valve. Aortic valve  regurgitation is not visualized. No aortic stenosis is present.   5. The inferior vena cava is normal in size with greater than 50%  respiratory variability, suggesting right atrial pressure of 3 mmHg.   Comparison(s): Echocardiogram done 10/01/12 showed an Ef of 60-65%.  Bilateral carotid duplex on August 30, 2021: IMPRESSION: Minimal amount of bilateral atherosclerotic plaque, right greater than left, not resulting in a hemodynamically significant stenosis within either internal carotid artery.    Coronary CTA on May 20, 2019: IMPRESSION: 1. Severe CAD in the distal RCA and proximal-distal circumflex artery, CADRADS = 4. CT FFR analysis will be performed and reported separately.   2. The patient's coronary artery calcium score is 443, which places the patient in the 47 percentile for age and sex matched control.   3. Normal coronary origin with right dominance.  Physical Exam:    VS:  BP (!) 142/70   Pulse 71   Ht 4\' 10"  (1.473 m)   Wt 133 lb (60.3 kg)   SpO2 99%   BMI 27.80 kg/m     Wt Readings from Last 3 Encounters:  04/05/24 133 lb (60.3 kg)  12/22/23 127 lb 3.2 oz (57.7 kg)  10/13/23 12 lb 4.8 oz (5.579 kg)    GEN: Well nourished, well developed in no acute distress HEENT: Normal NECK: No JVD; No carotid bruits CARDIAC: S1/S2, RRR, no murmurs, rubs, gallops; 2+ pulses RESPIRATORY:  Clear and diminished to auscultation without rales, wheezing or rhonchi  MUSCULOSKELETAL:  No edema along BLE; No deformity  SKIN: Warm and dry NEUROLOGIC:  Alert and oriented x 3 PSYCHIATRIC:  Calm, distracted during interview  ASSESSMENT & PLAN:    In order of problems listed above:  CAD Patient is a difficult historian. Denies any recent chest pain. Coronary CTA in 2020 revealed severe CAD in his RCA and proximal and distal circumflex artery, coronary artery calcium score was 443.  FFR analysis demonstrated possible hemodynamically significant lesion distal LAD.  Vessel diameter just proximal to the lesion was 2.65 mm.  It was recommended to consider aggressive medical management.  Was previously ordered NST in past, however she declined. No indication for ischemic evaluation at this time. She has allergies to several antianginals, cannot tolerate Amlodipine , Ranexa , or Imdur . Continue  current medication regimen at this time. Heart healthy diet and regular cardiovascular exercise encouraged.   Hypertension, medication management BP elevated.  Discussed to monitor BP at home at least 2 hours after medications and sitting for 5-10 minutes.  Heart healthy diet and regular cardiovascular exercise encouraged. Will restart lisinopril -hydrochlorothiazide  and will obtain BMET. Continue rest of current medication regimen. - Intolerant to Amlodipine , Bisoprolol  (caused bradycardia), and Imdur .  - If SBP is not at goal at future office visit, consider low dose ARB.   3. Hyperlipidemia Managed by PCP. Continue atorvastatin. Heart healthy diet and regular cardiovascular exercise encouraged.   4. Possible UTI, leg pains, abdominal pain, leg swelling Not in acute distress on exam. Brought up several concerns that do not sound cardiac related - see HPI. Recommended further follow-up with PCP. No indication for DVT on exam, no indication for arterial doppler at this time, will restart previous BP medication as noted above. Multiple medication intolerances - see allergy list. Continue to follow with PCP. Care and ED precautions discussed. Offered/recommended PCP referral, pt declines.   5. COPD  Denies any recent SHOB. Continue to follow with PCP.  6. Pre-operative cardiovascular risk assessment Ms.  Alcocer's perioperative risk of a major cardiac event is 0.4% according to the Revised Cardiac Risk Index (RCRI).  Therefore, she is at low risk for perioperative complications.   Her functional capacity is good at 6.05 METs according to the Duke Activity Status Index (DASI). Recommendations: According to ACC/AHA guidelines, no further cardiovascular testing needed.  The patient may proceed to surgery at acceptable risk.   Antiplatelet and/or Anticoagulation Recommendations: The patient should remain on Aspirin without interruption, unless surgeon feels that bleeding risk it too high. If bleeding  risk is high, okay to hold aspirin 7 days prior to procedure and resume post op when felt safe to do so.    7. Disposition: Follow-up with MD/APP in 6 months or sooner if anything changes.    Medication Adjustments/Labs and Tests Ordered: Current medicines are reviewed at length with the patient today.  Concerns regarding medicines are outlined above.  Orders Placed This Encounter  Procedures   Basic Metabolic Panel (BMET)   EKG 12-Lead   Meds ordered this encounter  Medications   lisinopril -hydrochlorothiazide  (ZESTORETIC ) 20-25 MG tablet    Sig: Take 1 tablet by mouth daily.    Dispense:  30 tablet    Refill:  6    04/05/24 restart    Patient Instructions  Medication Instructions:  Your physician has recommended you make the following change in your medication:  Please restart Lisinopril -Hydrochlorothiazide  20 mg-25 mg   Labwork: In 1-2 weeks at [facility]   Testing/Procedures: None   Follow-Up: Your physician recommends that you schedule a follow-up appointment in: 6 months   Any Other Special Instructions Will Be Listed Below (If Applicable).  If you need a refill on your cardiac medications before your next appointment, please call your pharmacy.    Signed, Lasalle Pointer, NP

## 2024-04-14 ENCOUNTER — Other Ambulatory Visit (HOSPITAL_COMMUNITY): Payer: Self-pay | Admitting: Nurse Practitioner

## 2024-04-14 DIAGNOSIS — M5416 Radiculopathy, lumbar region: Secondary | ICD-10-CM

## 2024-04-15 ENCOUNTER — Telehealth

## 2024-04-21 ENCOUNTER — Telehealth: Payer: Self-pay | Admitting: Cardiology

## 2024-04-21 NOTE — Telephone Encounter (Signed)
 Pt c/o medication issue:  1. Name of Medication:   lisinopril -hydrochlorothiazide  (ZESTORETIC ) 20-25 MG tablet   2. How are you currently taking this medication (dosage and times per day)?    As prescribed  3. Are you having a reaction (difficulty breathing--STAT)?   Frequent urination  4. What is your medication issue?   Patient stated she has been getting up 7-8 times a night to urinate and wants to stop taking this medication and get alternate medication.

## 2024-04-21 NOTE — Telephone Encounter (Signed)
 Spoke with patient state that she is going to the bathroom frequently. When she came in to see Nellie Banas on 05/12 she restarted the lisinopril -hydrochlorothiazide . Also ordered labs to be completed within 1-2 weeks to check kidney functions. Advised her that will fax labs to [facility] so they will be there when she goes to have them drawn. She is requesting that a new prescription be sent in without the hydrochlorothiazide . Recommended her to have labs drawn first. Will send to [place], but she requested to have labs done first. Once she has labs completed then she can make recommendations. Patient stated that she is taking medications for UTI. As well she was instructed to follow up with PCP she declined. Routed over to [facility] for further review.

## 2024-04-22 NOTE — Telephone Encounter (Signed)
 Per Victoria Townsend:  Needs to follow-up with PCP regarding UTI. I want to see what labs show first. The new medication prescribed for HTN should not cause UTI symptoms. The increased urinary frequency is most likely d/t UTI symptoms and/or the hydrochlorothiazide  diuretic proprieties of medicine.   Follow-up with PCP regarding her symptoms.   Thanks!   Best,  Lasalle Pointer, NP   Advised patient of [facility] recommendations. Has had her labs completed and advised her will let Victoria Townsend know so that she can review them.

## 2024-04-23 ENCOUNTER — Telehealth: Payer: Self-pay

## 2024-04-23 ENCOUNTER — Telehealth: Payer: Self-pay | Admitting: Nurse Practitioner

## 2024-04-23 MED ORDER — LISINOPRIL 20 MG PO TABS: 20.0000 mg | ORAL_TABLET | Freq: Every day | ORAL | 5 refills | Status: DC

## 2024-04-23 NOTE — Telephone Encounter (Addendum)
 Called and s/w patient per patient's request. I was notified about this by CMA, Arlice Bene. Called and s/w patient yesterday. She is wanting to know if the medication lisinopril  - hydrochlorothiazide  and wants to know if she can just take Lisinopril  by itself. Wants to know what is causing her leg swelling.   Went over and discussed her most recent lab results and went over and reviewed some of her medications with her. There appeared to be some confusion as patient states she is no longer taking verapamil  but at last office visit it is listed that she is taking this medicine.    Notified CMA Arlice Bene that we will call her pharmacy and verify with pharmacist the medications that she is taking.  Will stop lisinopril  with hydrochlorothiazide  and instructed her to take lisinopril  20 mg daily.  Also stated that she needs to follow-up with her PCP regarding her frequent urination and UTI symptoms.   Stated that she verbalized understanding was appreciative of my call.  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  I believe this is contributing to the difficulty with her medical therapy in the past.  See her allergy list.  Discussed appropriate follow-up for her at this moment would be 6 months with outpatient cardiology. Stated that she needs to let us  know if her BP is still not well-controlled within the next 2 to 3 weeks if her SBP is greater than 140 and follow-up with PCP.   Lasalle Pointer, NP

## 2024-04-23 NOTE — Telephone Encounter (Signed)
 Per Nellie Banas:  We are going to change her prescription to just lisinopril  and discontinue hydrochlorothiazide . Continue lisinopril  20 mg daily. She tells me she is not taking verapamil  when sh she has been taking this at last office visit. We need to call [facility] and verify her med list.   [facility] spoke with [facility] stated that patient hasn't been taking Verapamil  since 05/2023 and discontinued on 08/2023. Updated medication per Nellie Banas. And informed pharmacy a new script will be sent in as well.   I went over her labs with her. Needs to follow-up with PCP. She supposed to see Dr. Londa Rival in July and that is going to be way too soon for follow-up. I recommend canceling this appointment in July if it is okay with her and reschedule with me in 6 months. She needs to let us  know if her BP is still not well-controlled within the next 2 to 3 weeks if her SBP is greater than 140 and follow-up with PCP.   Sent to scheduling to make changes in her appointment if patient is agreeable to moving appointment out.

## 2024-04-23 NOTE — Telephone Encounter (Signed)
 I called patient to cx July appt she said that was fine but, if she had any issues with this new medication she would be back sooner than November.

## 2024-04-30 ENCOUNTER — Ambulatory Visit: Payer: Self-pay | Admitting: Internal Medicine

## 2024-04-30 DIAGNOSIS — Z79899 Other long term (current) drug therapy: Secondary | ICD-10-CM

## 2024-05-11 ENCOUNTER — Encounter: Payer: Self-pay | Admitting: Physician Assistant

## 2024-05-26 ENCOUNTER — Telehealth: Payer: Self-pay | Admitting: Cardiology

## 2024-05-26 NOTE — Telephone Encounter (Signed)
 Pt feels like she needs a CT scan to check her heart

## 2024-05-26 NOTE — Telephone Encounter (Signed)
 Patient has recently had a ct of chest and lungs, are you able to see what is needed?

## 2024-05-27 NOTE — Telephone Encounter (Signed)
 Pt calling again, requesting cb

## 2024-05-27 NOTE — Telephone Encounter (Signed)
    Covering for [name] - I am able to see the CT report and this did mention coronary artery atherosclerosis and aortic atherosclerosis (this type of scan does not determine the amount of plaque as it was a lung cancer screening). These are KNOWN diagnoses as prior Coronary CTA in 2022 showed coronary artery disease but was not to the extent that she needed a stent at that time. The need for follow-up imaging is based on symptoms and by review of her recent office visit with [name] in 03/2024, she denied any recent anginal symptoms. Would continue with risk factor modification in the interim including being on ASA 81 mg daily and Atorvastatin 10 mg daily.  Would make sure she has routine follow-up with [name], NP or Dr. Debera   Signed, Victoria CHRISTELLA Qua, PA-C 05/27/2024, 8:29 AM Pager: [phone]

## 2024-05-31 ENCOUNTER — Telehealth: Payer: Self-pay | Admitting: Cardiology

## 2024-05-31 NOTE — Telephone Encounter (Signed)
 Pt c/o swelling/edema: STAT if pt has developed SOB within 24 hours  If swelling, where is the swelling located?   Around ankles and front lower part of leg   How much weight have you gained and in what time span?  No  Have you gained 2 pounds in a day or 5 pounds in a week?   No  Do you have a log of your daily weights (if so, list)?   Are you currently taking a fluid pill?  No  Are you currently SOB?   Yes  Have you traveled recently in a car or plane for an extended period of time?   No  Patient is concerned she has been having swelling and stinging chest pain under left breast.  Patient is also concerned about her BP medication - lisinopril  (ZESTRIL ) 20 MG tablet.

## 2024-06-01 NOTE — Telephone Encounter (Signed)
 Please see previous phone encounter from last week. As well as previous encounters.  Spoke with patient today regarding this medication

## 2024-06-01 NOTE — Telephone Encounter (Signed)
 Patient informed and verbalized understanding of plan. Advised patient she needed an in office appointment to review her concerns with her provider as she canceled her appointment with Dr.McDowell in July I advised patient our appointments are booked out until the end of august and beginning of September. I advised patient I would have a scheduler call her and get her scheduled and if she needed emergency assistance before then to please be  seen by the ED or her PCP. Patient states we had a horrible establishment and wanted to switch to [facility] I advised patient we are a part of [facility] and that if she felt she needed to switch to [facility] they was completely her decision.

## 2024-06-07 ENCOUNTER — Encounter: Payer: Self-pay | Admitting: Urology

## 2024-06-07 ENCOUNTER — Ambulatory Visit: Admitting: Urology

## 2024-06-07 VITALS — BP 164/93 | HR 66

## 2024-06-07 DIAGNOSIS — R350 Frequency of micturition: Secondary | ICD-10-CM

## 2024-06-07 DIAGNOSIS — N200 Calculus of kidney: Secondary | ICD-10-CM | POA: Diagnosis not present

## 2024-06-07 LAB — BLADDER SCAN AMB NON-IMAGING: Scan Result: 13

## 2024-06-07 MED ORDER — SOLIFENACIN SUCCINATE 5 MG PO TABS: 5.0000 mg | ORAL_TABLET | Freq: Every day | ORAL | 11 refills | Status: AC

## 2024-06-07 NOTE — Addendum Note (Signed)
 Addended by: ANN VELERIA SAUNDERS on: 06/07/2024 01:59 PM   Modules accepted: Orders

## 2024-06-07 NOTE — Progress Notes (Signed)
 Bladder Scan completed today.  Patient cannot void prior to the bladder scan. Bladder scan result: 13ml  Performed By: Veleria, CMA  Additional notes-  MD to see after

## 2024-06-07 NOTE — Patient Instructions (Signed)

## 2024-06-07 NOTE — Progress Notes (Signed)
 06/07/2024 1:41 PM   RUTHELLA KIRCHMAN 22-Apr-1961 [id number]  Referring provider: Cristine Mt, MD [address] [place],  [place] [address]  Difficulty urinating   HPI: Ms Mignone is a 63yo here for evaluation of nephrolithiasis and difficulty urinating. She denies any recent stone events. She underwent 06/18/2022 showed bilateral stone up to 5mm. She she has urinary frequency every 1-11.5 hours. She has nocturia 5-6x. She urinates a large amount at night. She has not been tested for sleep apnea. She denies any significant fluid intake within 2 hours of going to bed. She is currently on oxybutynin 5mg  daily. She previously saw Dr. Roseann and was placed on mirabegron  25mg  daily which failed to improve her urinary frequency. She has a hx of COPD. She has previously saw Dr. Javaid and had urethral dilations.    PMH: Past Medical History:  Diagnosis Date   Agoraphobia    Anxiety disorder    Arthritis    Asthma    Bladder polyps    CAD (coronary artery disease)    Cardiac CTA June 2020   Colon polyps    COPD (chronic obstructive pulmonary disease) (HCC)    Depression    Emphysema lung (HCC)    Essential hypertension    Fibromyalgia    Gallstones    GERD (gastroesophageal reflux disease)    Herpes    Irregular heartbeat    Nephrolithiasis    Polysubstance abuse (HCC)    History of cocaine, alcohol, marijuana in the past   PTSD (post-traumatic stress disorder)    Shoulder pain    Shoulder has been injected    Surgical History: Past Surgical History:  Procedure Laterality Date   APPENDECTOMY     Bladder polyps     CESAREAN SECTION WITH BILATERAL TUBAL LIGATION     CHOLECYSTECTOMY     COLONOSCOPY  May 2012   Dr. Tobie: scattered diverticula pancolonic   COLONOSCOPY WITH PROPOFOL  N/A 01/29/2018   Procedure: COLONOSCOPY WITH PROPOFOL ;  Surgeon: Shaaron Lamar HERO, MD;  [facility];  Service: Endoscopy;  Laterality: N/A;   ESOPHAGOGASTRODUODENOSCOPY  April 2012   Dr. Tobie:  gastritis   ESOPHAGOGASTRODUODENOSCOPY N/A 08/09/2013   MFM:Hjdumpr polyps/abnormal gastric because of uncertain significance-status post biopsy. Small hiatal [employer]tn chronic inflammation on bx. hyperplastic polyp   ESOPHAGOGASTRODUODENOSCOPY (EGD) WITH PROPOFOL  N/A 01/29/2018   Procedure: ESOPHAGOGASTRODUODENOSCOPY (EGD) WITH PROPOFOL ;  Surgeon: Shaaron Lamar HERO, MD;  Location: [facility];  Service: Endoscopy;  Laterality: N/A;  1:30   MALONEY DILATION N/A 01/29/2018   Procedure: AGAPITO DILATION;  Surgeon: Shaaron Lamar HERO, MD;  Location: [facility];  Service: Endoscopy;  Laterality: N/A;   URETHRAL STRICTURE DILATATION     WRIST FRACTURE SURGERY Right     Home Medications:  Allergies as of 06/07/2024       Reactions   Ciprofloxacin Swelling, Other (See Comments)   Facial swelling   Codeine Nausea And Vomiting   Ibuprofen Other (See Comments)   Oral cavity burning sensations   Amlodipine     Leg edema   Bisoprolol  Other (See Comments)   Bradycardia   Lasix  [furosemide ] Other (See Comments)   Cannot tolerate this.    Ranexa  Daylen.Daisy ]    Symbicort [budesonide-formoterol Fumarate] Other (See Comments)   Took breath away   Erythromycin Other (See Comments)   Facial swelling   Imdur  [isosorbide  Nitrate] Other (See Comments)   Headaches   Mucinex [guaifenesin Er] Nausea And Vomiting   Nsaids Other (See Comments)  She reports she does not take.    Roxicodone [oxycodone] Swelling   Mouth swelling   Shellfish Allergy Rash        Medication List        Accurate as of June 07, 2024  1:41 PM. If you have any questions, ask your nurse or doctor.          albuterol 108 (90 Base) MCG/ACT inhaler Commonly known as: VENTOLIN HFA Inhale 2 puffs into the lungs every 6 (six) hours as needed for shortness of breath.   ALPRAZolam 1 MG tablet Commonly known as: XANAX Take 1 mg by mouth 3 (three) times daily.   ascorbic acid 500 MG tablet Commonly known as: VITAMIN  C Take 500 mg by mouth daily.   aspirin EC 81 MG tablet Take 81 mg by mouth daily.   atorvastatin 10 MG tablet Commonly known as: LIPITOR Take 1 tablet by mouth daily.   cephALEXin 500 MG capsule Commonly known as: KEFLEX Take 500 mg by mouth every 6 (six) hours.   chlorhexidine  0.12 % solution Commonly known as: PERIDEX  Use as directed 5 mLs in the mouth or throat 4 (four) times daily.   gabapentin 400 MG capsule Commonly known as: NEURONTIN Take 400 mg by mouth 3 (three) times daily.   hydrocortisone  2.5 % ointment APPLY AS DIRECTED DAILY AS NEEDED FOR ITCHING.   lidocaine  5 % Commonly known as: LIDODERM  Place 1 patch onto the skin every 12 (twelve) hours.   lisinopril  20 MG tablet Commonly known as: ZESTRIL  Take 1 tablet (20 mg total) by mouth daily.   Medical Compression Stockings Misc 1 each by Does not apply route as directed. Low Pressure Knee High Compression stockings Dx: leg swelling   naloxone 4 MG/0.1ML Liqd nasal spray kit Commonly known as: NARCAN SMARTSIG:Both Nares   nitroGLYCERIN  0.4 MG SL tablet Commonly known as: NITROSTAT  DISSOLVE 1 TABLET UNDER THE TONGUE EVERY 5 MINUTES AS NEEDED FOR CHEST PAIN. DO NOT EXCEED A TOTAL OF 3 DOSES IN 15 MINUTES.   nystatin 100000 UNIT/ML suspension Commonly known as: MYCOSTATIN Take 5 mLs by mouth 4 (four) times daily.   Omron 3 Series BP Monitor Devi Use as directed   oxybutynin 5 MG tablet Commonly known as: DITROPAN Take 5 mg by mouth daily.   oxyCODONE-acetaminophen  7.5-325 MG tablet Commonly known as: PERCOCET Take 1 tablet by mouth 3 (three) times daily as needed.   pantoprazole 40 MG tablet Commonly known as: PROTONIX Take 40 mg by mouth 2 (two) times daily.   Restasis 0.05 % ophthalmic emulsion Generic drug: cycloSPORINE Place 1 drop into both eyes 2 (two) times daily.   valACYclovir 500 MG tablet Commonly known as: VALTREX Take 1 tablet by mouth daily.   verapamil  360 MG 24 hr  capsule Commonly known as: VERELAN  Take 1 capsule (360 mg total) by mouth at bedtime.        Allergies:  Allergies  Allergen Reactions   Ciprofloxacin Swelling and Other (See Comments)    Facial swelling   Codeine Nausea And Vomiting   Ibuprofen Other (See Comments)    Oral cavity burning sensations   Amlodipine      Leg edema   Bisoprolol  Other (See Comments)    Bradycardia   Lasix  [Furosemide ] Other (See Comments)    Cannot tolerate this.    Ranexa  [Ranolazine ]    Symbicort [Budesonide-Formoterol Fumarate] Other (See Comments)    Took breath away   Erythromycin Other (See Comments)    Facial  swelling   Imdur  [Isosorbide  Nitrate] Other (See Comments)    Headaches   Mucinex [Guaifenesin Er] Nausea And Vomiting   Nsaids Other (See Comments)    She reports she does not take.    Roxicodone [Oxycodone] Swelling    Mouth swelling   Shellfish Allergy Rash    Family History: Family History  Problem Relation Age of Onset   Coronary artery disease Mother        CABG   Diabetes Mother    Heart attack Mother    Liver cancer Sister    Alcohol abuse Sister    Drug abuse Sister    Clotting disorder Sister    Alcohol abuse Brother    Drug abuse Brother    Diabetes Brother    Diabetes Brother    Heart disease Brother    Obesity Brother    Alcohol abuse Brother    Drug abuse Brother    Stomach cancer Paternal Grandmother 42   Stomach cancer Maternal Aunt 60   Ovarian cancer Maternal Aunt    Breast cancer Paternal Aunt    Colon cancer Paternal Uncle 19   Fibromyalgia Daughter    Thyroid disease Daughter    Anxiety disorder Daughter     Social History:  reports that she has been smoking cigarettes. She started smoking about 50 years ago. She has a 25 pack-year smoking history. She has never used smokeless tobacco. She reports that she does not currently use drugs after having used the following drugs: Marijuana and Cocaine. She reports that she does not drink  alcohol.  ROS: All other review of systems were reviewed and are negative except what is noted above in HPI  Physical Exam: BP (!) 164/93   Pulse 66   Constitutional:  Alert and oriented, No acute distress. HEENT: [place] AT, moist mucus membranes.  Trachea midline, no masses. Cardiovascular: No clubbing, cyanosis, or edema. Respiratory: Normal respiratory effort, no increased work of breathing. GI: Abdomen is soft, nontender, nondistended, no abdominal masses GU: No CVA tenderness.  Lymph: No cervical or inguinal lymphadenopathy. Skin: No rashes, bruises or suspicious lesions. Neurologic: Grossly intact, no focal deficits, moving all 4 extremities. Psychiatric: Normal mood and affect.  Laboratory Data: Lab Results  Component Value Date   WBC 7.4 10/13/2023   HGB 12.5 10/13/2023   HCT 36.8 10/13/2023   MCV 100.0 10/13/2023   PLT 271 10/13/2023    Lab Results  Component Value Date   CREATININE 0.81 10/13/2023    No results found for: PSA  No results found for: TESTOSTERONE  No results found for: HGBA1C  Urinalysis    Component Value Date/Time   COLORURINE STRAW (A) 10/13/2023 1745   APPEARANCEUR CLEAR 10/13/2023 1745   APPEARANCEUR Clear 05/30/2022 1152   LABSPEC 1.004 (L) 10/13/2023 1745   PHURINE 5.0 10/13/2023 1745   GLUCOSEU NEGATIVE 10/13/2023 1745   HGBUR NEGATIVE 10/13/2023 1745   BILIRUBINUR NEGATIVE 10/13/2023 1745   BILIRUBINUR Negative 05/30/2022 1152   KETONESUR NEGATIVE 10/13/2023 1745   PROTEINUR NEGATIVE 10/13/2023 1745   UROBILINOGEN 0.2 08/17/2013 0315   NITRITE NEGATIVE 10/13/2023 1745   LEUKOCYTESUR TRACE (A) 10/13/2023 1745    Lab Results  Component Value Date   LABMICR See below: 05/30/2022   WBCUA None seen 05/30/2022   LABEPIT 0-10 05/30/2022   BACTERIA NONE SEEN 10/13/2023    Pertinent Imaging: Ct 06/18/22: Images reviewed and discussed with the patient No results found for this or any previous visit.  No results found  for  this or any previous visit.  No results found for this or any previous visit.  No results found for this or any previous visit.  No results found for this or any previous visit.  No results found for this or any previous visit.  No results found for this or any previous visit.  Results for orders placed during the hospital encounter of 06/18/22  CT RENAL STONE STUDY  Narrative CLINICAL DATA:  Bilateral flank pain for 6 months.  Nephrolithiasis.  EXAM: CT ABDOMEN AND PELVIS WITHOUT CONTRAST  TECHNIQUE: Multidetector CT imaging of the abdomen and pelvis was performed following the standard protocol without IV contrast.  RADIATION DOSE REDUCTION: This exam was performed according to the departmental dose-optimization program which includes automated exposure control, adjustment of the mA and/or kV according to patient size and/or use of iterative reconstruction technique.  COMPARISON:  05/16/2021  FINDINGS: Lower chest: No acute findings.  Hepatobiliary: No mass visualized on this unenhanced exam. Prior cholecystectomy. No evidence of biliary obstruction.  Pancreas: No mass or inflammatory process visualized on this unenhanced exam.  Spleen:  Within normal limits in size.  Adrenals/Urinary tract: Several tiny less than 5 mm renal calculi are seen bilaterally. No evidence of ureteral calculi or hydronephrosis. Unremarkable unopacified urinary bladder.  Stomach/Bowel: No evidence of obstruction, inflammatory process, or abnormal fluid collections. Diffuse colonic diverticulosis is again noted, there is no evidence of diverticulitis.  Vascular/Lymphatic: No pathologically enlarged lymph nodes identified. No evidence of abdominal aortic aneurysm. Aortic atherosclerotic calcification incidentally noted.  Reproductive:  No mass or other significant abnormality.  Other:  None.  Musculoskeletal:  No suspicious bone lesions identified.  IMPRESSION: Tiny bilateral  renal calculi. No evidence of ureteral calculi, hydronephrosis, or other acute findings.  Colonic diverticulosis, without radiographic evidence of diverticulitis.   Electronically Signed By: Norleen DELENA Kil M.D. On: 06/18/2022 17:32   Assessment & Plan:    1. Urinary frequency (Primary) -We will trial vesicare  5mg  daily and I will schedule her for cystoscopy with possible urethral dilation - Urinalysis, Routine w reflex microscopic - BLADDER SCAN AMB NON-IMAGING  2. Nephrolithiasis -followup 3 months with KUB   No follow-ups on file.  Belvie Clara, MD  [facility] [place]

## 2024-06-10 ENCOUNTER — Ambulatory Visit: Admitting: Cardiology

## 2024-07-01 ENCOUNTER — Encounter: Payer: Self-pay | Admitting: Physician Assistant

## 2024-07-01 ENCOUNTER — Ambulatory Visit: Admitting: Physician Assistant

## 2024-07-01 ENCOUNTER — Other Ambulatory Visit

## 2024-07-01 VITALS — BP 124/72 | HR 68 | Ht <= 58 in | Wt 144.0 lb

## 2024-07-01 DIAGNOSIS — G8929 Other chronic pain: Secondary | ICD-10-CM

## 2024-07-01 DIAGNOSIS — R1011 Right upper quadrant pain: Secondary | ICD-10-CM | POA: Diagnosis not present

## 2024-07-01 LAB — BASIC METABOLIC PANEL WITH GFR
BUN: 12 mg/dL (ref 6–23)
CO2: 27 meq/L (ref 19–32)
Calcium: 9.5 mg/dL (ref 8.4–10.5)
Chloride: 103 meq/L (ref 96–112)
Creatinine, Ser: 0.71 mg/dL (ref 0.40–1.20)
GFR: 90.08 mL/min (ref >60.00)
Glucose, Bld: 116 mg/dL — ABNORMAL HIGH (ref 70–99)
Potassium: 4 meq/L (ref 3.5–5.1)
Sodium: 141 meq/L (ref 135–145)

## 2024-07-01 NOTE — Patient Instructions (Signed)
 Your provider has requested that you go to the basement level for lab work before leaving today. Press B on the elevator. The lab is located at the first door on the left as you exit the elevator.  You have been scheduled for a CT scan of the abdomen and pelvis at [facility], 1st floor Radiology. You are scheduled on Monday 08/09/24 at 3 pm. You should arrive 15 minutes prior to your appointment time for registration.   Please follow the written instructions below on the day of your exam:   1) Do not eat anything after 1 pm (4 hours prior to your test) )   You may take any medications as prescribed with a small amount of water , if necessary. If you take any of the following medications: METFORMIN, GLUCOPHAGE, GLUCOVANCE, AVANDAMET, RIOMET, FORTAMET, ACTOPLUS MET, JANUMET, GLUMETZA or METAGLIP, you MAY be asked to HOLD this medication 48 hours AFTER the exam.   The purpose of you drinking the oral contrast is to aid in the visualization of your intestinal tract. The contrast solution may cause some diarrhea. Depending on your individual set of symptoms, you may also receive an intravenous injection of x-ray contrast/dye. Plan on being at [facility] for 45 minutes or longer, depending on the type of exam you are having performed.   If you have any questions regarding your exam or if you need to reschedule, you may call [facility] at [phone] between the hours of 8:00 am and 5:00 pm, Monday-Friday.

## 2024-07-01 NOTE — Progress Notes (Signed)
 Chief Complaint: Chronic right upper quadrant pain  HPI:    Victoria Townsend is a 63 year old female, previously assigned to Dr. Eda, with a past medical history as listed below including COPD, CAD, emphysema and history of polysubstance abuse, who was referred to me by Bucio, Elsa C, FNP for a complaint of chronic right upper quadrant pain.      09/02/2019 patient seen in clinic by Victoria Shawl, NP for follow-up with right upper quadrant abdominal pain.  She had not had any pain since her last visit.  That time was noted she had chronic right upper quadrant pain with a normal EGD 3/19 as well as CT of the abdomen pelvis without identifying cause, abdominal Doppler/duplex normal, status postcholecystectomy.  Is felt possibly this pain is from adhesions from prior surgery or internal hernia.  This had improved at that visit.  She was continued on Dicyclomine  10 mg twice daily.  Her reflux was stable.  Her colonoscopy was recommended to be repeated in March 2024 given a family history of colon cancer.    10/19/2019 MRI of the abdomen with and without was image degraded by motion artifact, no mass or other significant abnormality.    02/07/2020 patient seen by digestive health for upper abdominal pain.  She described pain for 8 years which she thought may be related to scar tissue on the inside, described intermittent pain that felt like a knot, this is exacerbated by bending over and with movement in general.  Patient had previously being given a trial of Amitriptyline and a referral to pain management was recommended however she had not tried either of these as she had not seen help from Elavil in the past and had previously followed with pain management.  At that time was recommended the patient trial Voltaren gel has a large component of her symptoms was musculoskeletal.  She was referred to pain management for consideration of trigger point injections.    08/28/21 patient seen in clinic and had  the same complaint of chronic right upper quadrant pain.  She was on Pantoprazole 40B ID which took care of her reflux pain.  She tried abdominal wall injections and Elavil in the past which did not work.  Described pain as a 9-10/10 sharp and burning.  At that point discussed repeating EGD.  Scheduled in the [facility].  Also discussed rescheduling her pain injections if nothing was found.    10/10/2021 EGD with Z-line irregular at 36 cm from the incisors, gastritis, small hiatal hernia and otherwise normal.  Pathology with reactive gastropathy.  Was recommended she abdominal wall injections.    06/18/2022 CT renal stone study showed tiny bilateral renal calculi.  Colonic diverticulosis.    09/24/2023 CT of the chest abdomen pelvis with contrast showed no evidence of traumatic injury, bilateral nephrolithiasis and colonic diverticulosis.    05/12/2024 CBC with a hemoglobin of 10.7, MCV normal.  CMP normal.  Magnesium normal.    05/17/2024 BMP with a potassium minimally elevated at 5.2 and otherwise normal.    05/17/2024 renal ultrasound bilateral nonobstructing nephrolithiasis.   79/25 urinalysis normal.    Today, the patient presents to clinic with her chronic right upper quadrant pain.  She tells me this is a stinging and an ache like a toothache just depending on the time of day.  This is rated as a 10/10 at its worst.  She thinks it is related to a hernia they saw on there and EGD.  She wants us  to fix  it.  Tells me she thinks this pain is also related to her chronic kidney infections.  If we can fix that she thinks everything else would get better.  She is on Oxycodone 4 times a day for back and shoulder pain and also gets spinal injections for these things.  Remains on Pantoprazole 40 mg twice a day.  Tells me his pain has gotten worse and more constant over the past year.  Does not ever recall trying abdominal wall injections in the past but does not think that would help at all.    Denies fever, chills or  weight loss.  In fact she has gained over 10 pounds.  Previous GI work-up: CT abdomen and pelvis with contrast 07/05/2019: Two nonobstructing bilateral renal calculi measuring up to 3 mm. No Hydronephrosis.  Status post cholecystectomy and appendectomy. No evidence of bowel obstruction.  No CT findings to account for the patient's chronic upper abdominal pain.   EGD 02/08/2018: - Normal esophagus. Dilated with a Maloney dilator with mild resistance at 56 Fr. - Normal stomach. - Normal duodenal bulb and second portion of the duodenum. - No specimens collected.   EGD 08/09/2013: small hiatal hernia and a few 1-18mm gastric hyperplastic polyps. No H. Pylori.   Colonoscopy 02/08/2018: Adequate prep - Internal hemorrhoids. - One 4 mm tubular adenomatous polyp at the splenic flexure removed. - Diverticulosis in the sigmoid colon and in the descending colon. -51yr recall.   Past Medical History:  Diagnosis Date   Agoraphobia    Anxiety disorder    Arthritis    Asthma    Bladder polyps    CAD (coronary artery disease)    Cardiac CTA June 2020   Colon polyps    COPD (chronic obstructive pulmonary disease) (HCC)    Depression    Emphysema lung (HCC)    Essential hypertension    Fibromyalgia    Gallstones    GERD (gastroesophageal reflux disease)    Herpes    Irregular heartbeat    Nephrolithiasis    Polysubstance abuse (HCC)    History of cocaine, alcohol, marijuana in the past   PTSD (post-traumatic stress disorder)    Shoulder pain    Shoulder has been injected    Past Surgical History:  Procedure Laterality Date   APPENDECTOMY     Bladder polyps     CESAREAN SECTION WITH BILATERAL TUBAL LIGATION     CHOLECYSTECTOMY     COLONOSCOPY  May 2012   Dr. Tobie: scattered diverticula pancolonic   COLONOSCOPY WITH PROPOFOL  N/A 01/29/2018   Procedure: COLONOSCOPY WITH PROPOFOL ;  Surgeon: Shaaron Lamar HERO, MD;  [facility]: [facility];  Service: Endoscopy;  Laterality: N/A;    ESOPHAGOGASTRODUODENOSCOPY  April 2012   Dr. Tobie: gastritis   ESOPHAGOGASTRODUODENOSCOPY N/A 08/09/2013   MFM:Hjdumpr polyps/abnormal gastric because of uncertain significance-status post biopsy. Small hiatal [employer] chronic inflammation on bx. hyperplastic polyp   ESOPHAGOGASTRODUODENOSCOPY (EGD) WITH PROPOFOL  N/A 01/29/2018   Procedure: ESOPHAGOGASTRODUODENOSCOPY (EGD) WITH PROPOFOL ;  Surgeon: Shaaron Lamar HERO, MD;  Location: [facility];  Service: Endoscopy;  Laterality: N/A;  1:30   MALONEY DILATION N/A 01/29/2018   Procedure: AGAPITO DILATION;  Surgeon: Shaaron Lamar HERO, MD;  Location: [facility];  Service: Endoscopy;  Laterality: N/A;   URETHRAL STRICTURE DILATATION     WRIST FRACTURE SURGERY Right     Current Outpatient Medications  Medication Sig Dispense Refill   albuterol (VENTOLIN HFA) 108 (90 Base) MCG/ACT inhaler Inhale 2 puffs into the  lungs every 6 (six) hours as needed for shortness of breath.     ALPRAZolam (XANAX) 1 MG tablet Take 1 mg by mouth 3 (three) times daily.     aspirin 81 MG EC tablet Take 81 mg by mouth daily.     atorvastatin (LIPITOR) 10 MG tablet Take 1 tablet by mouth daily.     Blood Pressure Monitoring (OMRON 3 SERIES BP MONITOR) DEVI Use as directed 1 each 0   cephALEXin (KEFLEX) 500 MG capsule Take 500 mg by mouth every 6 (six) hours.     chlorhexidine  (PERIDEX ) 0.12 % solution Use as directed 5 mLs in the mouth or throat 4 (four) times daily.     Elastic Bandages & Supports (MEDICAL COMPRESSION STOCKINGS) MISC 1 each by Does not apply route as directed. Low Pressure Knee High Compression stockings Dx: leg swelling 1 each 0   gabapentin (NEURONTIN) 400 MG capsule Take 400 mg by mouth 3 (three) times daily.     hydrocortisone  2.5 % ointment APPLY AS DIRECTED DAILY AS NEEDED FOR ITCHING. 28.35 g 2   lidocaine  (LIDODERM ) 5 % Place 1 patch onto the skin every 12 (twelve) hours.     lisinopril  (ZESTRIL ) 20 MG tablet Take 1 tablet (20 mg total) by mouth  daily. 30 tablet 5   naloxone (NARCAN) nasal spray 4 mg/0.1 mL SMARTSIG:Both Nares     nitroGLYCERIN  (NITROSTAT ) 0.4 MG SL tablet DISSOLVE 1 TABLET UNDER THE TONGUE EVERY 5 MINUTES AS NEEDED FOR CHEST PAIN. DO NOT EXCEED A TOTAL OF 3 DOSES IN 15 MINUTES. 25 tablet 3   nystatin (MYCOSTATIN) 100000 UNIT/ML suspension Take 5 mLs by mouth 4 (four) times daily.     oxybutynin (DITROPAN) 5 MG tablet Take 5 mg by mouth daily.     oxyCODONE-acetaminophen  (PERCOCET) 7.5-325 MG tablet Take 1 tablet by mouth 3 (three) times daily as needed.     pantoprazole (PROTONIX) 40 MG tablet Take 40 mg by mouth 2 (two) times daily.     RESTASIS 0.05 % ophthalmic emulsion Place 1 drop into both eyes 2 (two) times daily.     solifenacin  (VESICARE ) 5 MG tablet Take 1 tablet (5 mg total) by mouth daily. 30 tablet 11   valACYclovir (VALTREX) 500 MG tablet Take 1 tablet by mouth daily.     verapamil  (VERELAN ) 360 MG 24 hr capsule Take 1 capsule (360 mg total) by mouth at bedtime. 30 capsule 5   vitamin C (ASCORBIC ACID) 500 MG tablet Take 500 mg by mouth daily.     No current facility-administered medications for this visit.    Allergies as of 07/01/2024 - Review Complete 06/07/2024  Allergen Reaction Noted   Ciprofloxacin Swelling and Other (See Comments) 04/12/2013   Codeine Nausea And Vomiting    Ibuprofen Other (See Comments)    Amlodipine   12/18/2022   Bisoprolol  Other (See Comments) 12/18/2022   Lasix  [furosemide ] Other (See Comments) 12/18/2022   Ranexa  [ranolazine ]  12/06/2022   Symbicort [budesonide-formoterol fumarate] Other (See Comments) 06/21/2020   Erythromycin Other (See Comments) 08/16/2014   Imdur  [isosorbide  nitrate] Other (See Comments) 06/22/2019   Mucinex [guaifenesin er] Nausea And Vomiting 01/21/2018   Nsaids Other (See Comments) 12/31/2021   Roxicodone [oxycodone] Swelling 01/21/2018   Shellfish allergy Rash 04/12/2013    Family History  Problem Relation Age of Onset   Coronary artery  disease Mother        CABG   Diabetes Mother    Heart attack Mother  Liver cancer Sister    Alcohol abuse Sister    Drug abuse Sister    Clotting disorder Sister    Alcohol abuse Brother    Drug abuse Brother    Diabetes Brother    Diabetes Brother    Heart disease Brother    Obesity Brother    Alcohol abuse Brother    Drug abuse Brother    Stomach cancer Paternal Grandmother 60   Stomach cancer Maternal Aunt 60   Ovarian cancer Maternal Aunt    Breast cancer Paternal Aunt    Colon cancer Paternal Uncle 57   Fibromyalgia Daughter    Thyroid disease Daughter    Anxiety disorder Daughter     Social History   Socioeconomic History   Marital status: Single    Spouse name: Not on file   Number of children: 1   Years of education: Not on file   Highest education level: Not on file  Occupational History   Occupation: disabled  Tobacco Use   Smoking status: Every Day    Current packs/day: 0.50    Average packs/day: 0.5 packs/day for 50.1 years (25.0 ttl pk-yrs)    Types: Cigarettes    Start date: 06/10/1974   Smokeless tobacco: Never  Vaping Use   Vaping status: Never Used  Substance and Sexual Activity   Alcohol use: No    Alcohol/week: 0.0 standard drinks of alcohol    Comment: Former user, heavy etoh use prior to 1990s.   Drug use: Not Currently    Types: Marijuana, Cocaine    Comment: 04/13/19-no cocaine; denies marijuana   Sexual activity: Not on file  Other Topics Concern   Not on file  Social History Narrative   ** Merged History Encounter **       Social Drivers of Health   Financial Resource Strain: Medium Risk (12/23/2023)   Received from [facility]   Overall Financial Resource Strain (CARDIA)    Difficulty of Paying Living Expenses: Somewhat hard  Food Insecurity: No Food Insecurity (12/23/2023)   [facility] from [facility]   Hunger Vital Sign    Within the past 12 months, you worried that your food would run out before you got the money to buy  more.: Never true    Within the past 12 months, the food you bought just didn't last and you didn't have money to get more.: Never true  Transportation Needs: No Transportation Needs (12/23/2023)   Received from [facility] - Transportation    Lack of Transportation (Medical): No    Lack of Transportation (Non-Medical): No  Physical Activity: Inactive (02/10/2024)   Received from [facility]   Exercise Vital Sign    On average, how many days per week do you engage in moderate to strenuous exercise (like a brisk walk)?: 0 days    On average, how many minutes do you engage in exercise at this level?: 0 min  Stress: Stress Concern Present (07/11/2022)   Received from [facility] - Occupational Stress Questionnaire    Feeling of Stress : To some extent  Social Connections: Unknown (03/28/2022)   Received from [facility]   Social Network    Social Network: Not on file  Intimate Partner Violence: Not At Risk (02/10/2024)   Received from [facility]   Humiliation, Afraid, Rape, and Kick questionnaire    Within the last year, have you been afraid of your partner or  ex-partner?: No    Within the last year, have you been humiliated or emotionally abused in other ways by your partner or ex-partner?: No    Within the last year, have you been kicked, hit, slapped, or otherwise physically hurt by your partner or ex-partner?: No    Within the last year, have you been raped or forced to have any kind of sexual activity by your partner or ex-partner?: No    Review of Systems:    Constitutional: No weight loss, fever or chills Cardiovascular: No chest pain, chest pressure or palpitations   Respiratory: No SOB or cough Gastrointestinal: See HPI and otherwise negative   Physical Exam:  Vital signs: BP 124/72   Pulse 68   Ht 4' 10 (1.473 m)   Wt 144 lb (65.3 kg)   BMI 30.10 kg/m    Constitutional: Caucasian female appears to be  in NAD, Well developed, Well nourished, alert and cooperative Respiratory: Respirations even and unlabored. Lungs clear to auscultation bilaterally.   No wheezes, crackles, or rhonchi.  Cardiovascular: Normal S1, S2. No MRG. Regular rate and rhythm. No peripheral edema, cyanosis or pallor.  Gastrointestinal:  Soft, nondistended, mild -mod RUQ ttp, No rebound or guarding. Normal bowel sounds. No appreciable masses or hepatomegaly. Rectal:  Not performed.  Psychiatric: Oriented to person, place and time. Demonstrates good judgement and reason without abnormal affect or behaviors.  RELEVANT LABS AND IMAGING: CBC    Component Value Date/Time   WBC 7.4 10/13/2023 1639   RBC 3.68 (L) 10/13/2023 1639   HGB 12.5 10/13/2023 1639   HCT 36.8 10/13/2023 1639   PLT 271 10/13/2023 1639   MCV 100.0 10/13/2023 1639   MCH 34.0 10/13/2023 1639   MCHC 34.0 10/13/2023 1639   RDW 12.3 10/13/2023 1639   LYMPHSABS 2.9 06/18/2019 1333   MONOABS 0.5 06/18/2019 1333   EOSABS 0.2 06/18/2019 1333   BASOSABS 0.0 06/18/2019 1333    CMP     Component Value Date/Time   NA 133 (L) 10/13/2023 1639   K 3.5 10/13/2023 1639   CL 99 10/13/2023 1639   CO2 27 10/13/2023 1639   GLUCOSE 81 10/13/2023 1639   BUN 11 10/13/2023 1639   CREATININE 0.81 10/13/2023 1639   CALCIUM 9.8 10/13/2023 1639   PROT 7.0 10/13/2023 1639   ALBUMIN 4.0 10/13/2023 1639   AST 25 10/13/2023 1639   ALT 14 10/13/2023 1639   ALKPHOS 56 10/13/2023 1639   BILITOT 0.6 10/13/2023 1639   GFRNONAA >60 10/13/2023 1639   GFRAA >60 01/27/2018 1105    Assessment: 1.  Chronic right upper quadrant pain: This has been evaluated extensively in the past, the last time he was worked up by us  was about 3 years ago including an EGD, prior MRI and CTs as well as an ultrasound of the mesenteric arteries, patient is status post cholecystectomy in the past pain has been thought psychosomatic +/- adhesions and abdominal wall injections were recommended but  never pursued by the patient, no change with the twice daily PPI, last EGD with a small hiatal hernia and reactive gastropathy; most likely still psychosomatic +/- adhesions 2.  Screening for colorectal cancer: Last colonoscopy in 2019 with repeat recommended in 7 years, should be due in March of next year  Plan: 1.  Had a long discussion with the patient.  She is very sincere in the fact that she feels that this pain has worsened over the past year, we have not worked her up for  3 years.  At this point we will start with a CT of the abdomen and pelvis with contrast for further evaluation (last at outside facility in 2024).  Repeat BMP to check kidney function. 2.  Pending results from above could consider another repeat EGD.  Discussed that I do not think this will help us  identify what is going on and even if we saw an enlarged hiatal hernia this would not cause the pain she is describing in her right upper quadrant. 3.  Again discussed the possibility of abdominal wall injections which the patient tells me she does not think will help at all.  She is already on Oxycodone 4 times a day and this does not help either.  This pain has been going on for years. 4.  Patient to follow in clinic per recommendations after CT above.  Continue Pantoprazole 40 twice daily. 5.  Assigned to Dr. Nandigam today.  Delon Failing, PA-C [facility] 07/01/2024, 1:02 PM  Cc: Bucio, Elsa C, FNP

## 2024-07-02 ENCOUNTER — Telehealth: Payer: Self-pay | Admitting: Physician Assistant

## 2024-07-02 ENCOUNTER — Ambulatory Visit: Payer: Self-pay | Admitting: Physician Assistant

## 2024-07-02 NOTE — Telephone Encounter (Signed)
 Inbound call from patient requesting a call to discuss scheduled CT for 9/15. States she needs to have CT scan sooner. Also requesting to discuss infections she has been having. States she would take antibiotics but then it would come back. States she was advised to give us  a call back. Requesting a urgent call back. Please advise, thank you.

## 2024-07-02 NOTE — Progress Notes (Signed)
 BMP with normal BUN and creatinine, okay for contrasted CT of the abdomen pelvis.  Victoria Failing, PA-C

## 2024-07-02 NOTE — Telephone Encounter (Signed)
 Spoke with the patient. She was seen yesterday in office by Delon Failing, NP and assigned to Dr Shila. The patient was scheduled for a CT 38 days out and she wants this to be done sooner. Patient agrees to call the radiology scheduling to reschedule her appointment to a sooner time.

## 2024-07-07 ENCOUNTER — Telehealth: Payer: Self-pay | Admitting: Physician Assistant

## 2024-07-07 NOTE — Telephone Encounter (Signed)
 Victoria Townsend see message regarding CT scan that was set up in office

## 2024-07-07 NOTE — Telephone Encounter (Signed)
 Inbound call from patient stating that she wanted to speak with Delon Failing. I advised her that she was out of the office until Monday. Patient states that she wanted to see about moving up her Ct scan because it was too far out. I advised her that I could send her over to radiology and she said they will give her the run around about not having anything sooner. Patient requested I send a message to the nurse to see if there is anyway she could be seen sooner and if so she could do 8/14, 8/18, 8/20 or 8/21. Please advise.

## 2024-07-08 ENCOUNTER — Telehealth: Payer: Self-pay

## 2024-07-08 NOTE — Telephone Encounter (Signed)
 Pt called to get a sooner appointment w/ MD McKenzie for urethral dilation pt was advised she has the earliest available appointment pt stated she would prefer to speak w/ MD McKenzie nurse due to her constantly having infections she believes she needs to be seen sooner pt was advised a message would be sent to the MD and his nurse pt stated she will call back around 10 or 11 am

## 2024-07-08 NOTE — Telephone Encounter (Signed)
 Returned call to patient. Patient states she would like to have her urethral dilation surgery done asap. Patient informed that provider was on vacation this week. Patient made aware that a message would be sent to the provider and his surgery scheduler.  Patient voiced understanding. Patient requested that a message be left on her voicemail if she is not at home.

## 2024-07-08 NOTE — Telephone Encounter (Signed)
 Patient aware of change

## 2024-07-08 NOTE — Telephone Encounter (Signed)
 CT scan updated to 07/15/24 at [facility].

## 2024-07-13 ENCOUNTER — Other Ambulatory Visit: Payer: Self-pay | Admitting: Urology

## 2024-07-13 DIAGNOSIS — N3010 Interstitial cystitis (chronic) without hematuria: Secondary | ICD-10-CM

## 2024-07-15 ENCOUNTER — Ambulatory Visit (HOSPITAL_COMMUNITY)
Admission: RE | Admit: 2024-07-15 | Discharge: 2024-07-15 | Disposition: A | Discharge status: Home / Self Care | Source: Ambulatory Visit | Attending: Physician Assistant | Admitting: Physician Assistant

## 2024-07-15 DIAGNOSIS — R1011 Right upper quadrant pain: Secondary | ICD-10-CM | POA: Diagnosis present

## 2024-07-15 DIAGNOSIS — G8929 Other chronic pain: Secondary | ICD-10-CM | POA: Diagnosis present

## 2024-07-15 MED ORDER — IOHEXOL 9 MG/ML PO SOLN
1000.0000 mL | ORAL | Status: AC
  Administered 2024-07-15: 1000 mL via ORAL

## 2024-07-15 MED ORDER — IOHEXOL 300 MG/ML  SOLN
100.0000 mL | Freq: Once | INTRAMUSCULAR | Status: AC | PRN
  Administered 2024-07-15: 100 mL via INTRAVENOUS

## 2024-07-15 MED ORDER — IOHEXOL 9 MG/ML PO SOLN
ORAL | Status: AC
  Filled 2024-07-15: qty 1000

## 2024-07-16 NOTE — Progress Notes (Signed)
 Please let patient know that there is no etiology for her chronic right upper quadrant pain seen on recent CT.  She should be reassured by this fact.  Sincerely, Delon Failing, PA-C

## 2024-07-19 NOTE — Telephone Encounter (Signed)
 Patient would a urethral dilation ASAP and not a cysto. She state's that her urethral need to be dilation.

## 2024-07-21 ENCOUNTER — Telehealth: Payer: Self-pay | Admitting: Physician Assistant

## 2024-07-21 ENCOUNTER — Other Ambulatory Visit: Payer: Self-pay

## 2024-07-21 NOTE — Telephone Encounter (Signed)
 Inbound call from patient stating that she would like to speak with Delon Failing. She states she received a call this morning about her CT results. She states there is no way that her results were normal. She states her stomach  stings like bee and she drank 2 bottles of the liquid for her CT and she feels like there was no way that the dye did anything because she did not feel it warm her body. Patient is requesting to speak to Delon because she wants to repeat the scan at [facility]. She states that she will getting on a bus to go to her PCP's for her appointment at 2:00pm. Patient is requesting a call hopefully  before her appointment. Please advise.

## 2024-07-21 NOTE — Telephone Encounter (Signed)
 I spoke with the pt and advised her of the recommendations.  She is not happy with CT denial but I did try to explain the reasoning and offered appt with Dr Shila to discuss.  She was given the next available appt on 9/17 to discuss further. She was upset that she could not be seen at 130 pm on the day. I offered her the next available appt but she declined.  She did accept the appt for 9/17 and will call back with any other concerns

## 2024-07-27 ENCOUNTER — Telehealth: Payer: Self-pay

## 2024-07-27 NOTE — Telephone Encounter (Signed)
 Pt was offer sooner appointment but she ride transportation and was scheduled for next available urine drop off.    Dysuria  Patient called with c/o dysuria x 4-6 months.  Pain: burning  Severity:10/10  Associated Signs and Symptoms:  Fever: noTemp.0 Chills: yes Hematuria: no Urgency: yes Frequency: yes Hesitancy:yes Incontinence: no Nausea: no Vomiting: no  Urologic History:  Any Recent Urologic Surgeries or Procedures:yes- urethra stretch Recurrent UTI's:yes Cystitis: yes  Prostatitis:no Kidney or Bladder Stones: yes 3 small kidney stones Plan: Walk-in Clinic: no Appointment w/Physician: [no Lab visit scheduled for urine drop off: Yes Advice given:  Do you take on daily medications for UTI suppression No

## 2024-07-29 ENCOUNTER — Other Ambulatory Visit: Payer: Self-pay

## 2024-07-29 DIAGNOSIS — R350 Frequency of micturition: Secondary | ICD-10-CM

## 2024-08-02 ENCOUNTER — Ambulatory Visit (HOSPITAL_COMMUNITY)

## 2024-08-02 ENCOUNTER — Other Ambulatory Visit

## 2024-08-02 DIAGNOSIS — R350 Frequency of micturition: Secondary | ICD-10-CM

## 2024-08-02 LAB — URINALYSIS, ROUTINE W REFLEX MICROSCOPIC
Bilirubin, UA: NEGATIVE
Glucose, UA: NEGATIVE
Ketones, UA: NEGATIVE
Nitrite, UA: NEGATIVE
Protein,UA: NEGATIVE
RBC, UA: NEGATIVE
Specific Gravity, UA: <1.005 — ABNORMAL LOW (ref 1.005–1.030)
Urobilinogen, Ur: 0.2 mg/dL (ref 0.2–1.0)
pH, UA: 5.5 (ref 5.0–7.5)

## 2024-08-02 LAB — MICROSCOPIC EXAMINATION: Bacteria, UA: NONE SEEN

## 2024-08-04 ENCOUNTER — Telehealth: Payer: Self-pay | Admitting: Gastroenterology

## 2024-08-04 NOTE — Telephone Encounter (Signed)
 Inbound call from patient stating she would like to know if she can have an MRI done at [facility]  Patient states she feels a stingy sensation in her stomach, patient is scheduled for an office visit on 08/11/24.  Requesting a call back  Please advise  Thank you

## 2024-08-04 NOTE — Telephone Encounter (Signed)
 The pt has been advised that she will need to keep the appt with Dr Shila to discuss further

## 2024-08-04 NOTE — Telephone Encounter (Signed)
 This is a triage call. Patient CT request was denied previously from [place].

## 2024-08-04 NOTE — Telephone Encounter (Signed)
 Patient returning a call to add that she does not want MRI done at [facility] hospital. Patient stated she was done wrong and would not return to that hospital.  Please advise  Thank you

## 2024-08-09 ENCOUNTER — Other Ambulatory Visit (HOSPITAL_COMMUNITY)

## 2024-08-11 ENCOUNTER — Ambulatory Visit: Admitting: Gastroenterology

## 2024-08-14 ENCOUNTER — Other Ambulatory Visit (HOSPITAL_COMMUNITY)

## 2024-08-17 ENCOUNTER — Emergency Department (HOSPITAL_COMMUNITY)
Admission: EM | Admit: 2024-08-17 | Discharge: 2024-08-18 | Disposition: A | Discharge status: Home / Self Care | Attending: Emergency Medicine | Admitting: Emergency Medicine

## 2024-08-17 ENCOUNTER — Encounter (HOSPITAL_COMMUNITY): Payer: Self-pay

## 2024-08-17 DIAGNOSIS — Z79899 Other long term (current) drug therapy: Secondary | ICD-10-CM | POA: Diagnosis not present

## 2024-08-17 DIAGNOSIS — I10 Essential (primary) hypertension: Secondary | ICD-10-CM | POA: Diagnosis not present

## 2024-08-17 DIAGNOSIS — Z7982 Long term (current) use of aspirin: Secondary | ICD-10-CM | POA: Diagnosis not present

## 2024-08-17 DIAGNOSIS — R109 Unspecified abdominal pain: Secondary | ICD-10-CM | POA: Diagnosis present

## 2024-08-17 DIAGNOSIS — N3000 Acute cystitis without hematuria: Secondary | ICD-10-CM | POA: Diagnosis not present

## 2024-08-17 LAB — COMPREHENSIVE METABOLIC PANEL WITH GFR
ALT: 15 U/L (ref 0–44)
AST: 32 U/L (ref 15–41)
Albumin: 3.5 g/dL (ref 3.5–5.0)
Alkaline Phosphatase: 44 U/L (ref 38–126)
Anion gap: 9 (ref 5–15)
BUN: 14 mg/dL (ref 8–23)
CO2: 25 mmol/L (ref 22–32)
Calcium: 9 mg/dL (ref 8.9–10.3)
Chloride: 104 mmol/L (ref 98–111)
Creatinine, Ser: 1.07 mg/dL — ABNORMAL HIGH (ref 0.44–1.00)
GFR, Estimated: 58 mL/min — ABNORMAL LOW (ref >60)
Glucose, Bld: 103 mg/dL — ABNORMAL HIGH (ref 70–99)
Potassium: 4 mmol/L (ref 3.5–5.1)
Sodium: 138 mmol/L (ref 135–145)
Total Bilirubin: 1 mg/dL (ref 0.0–1.2)
Total Protein: 6 g/dL — ABNORMAL LOW (ref 6.5–8.1)

## 2024-08-17 LAB — CBC WITH DIFFERENTIAL/PLATELET
Abs Immature Granulocytes: 0.02 K/uL (ref 0.00–0.07)
Basophils Absolute: 0 K/uL (ref 0.0–0.1)
Basophils Relative: 1 %
Eosinophils Absolute: 0.4 K/uL (ref 0.0–0.5)
Eosinophils Relative: 6 %
HCT: 34.1 % — ABNORMAL LOW (ref 36.0–46.0)
Hemoglobin: 10.9 g/dL — ABNORMAL LOW (ref 12.0–15.0)
Immature Granulocytes: 0 %
Lymphocytes Relative: 42 %
Lymphs Abs: 2.6 K/uL (ref 0.7–4.0)
MCH: 31.7 pg (ref 26.0–34.0)
MCHC: 32 g/dL (ref 30.0–36.0)
MCV: 99.1 fL (ref 80.0–100.0)
Monocytes Absolute: 0.7 K/uL (ref 0.1–1.0)
Monocytes Relative: 11 %
Neutro Abs: 2.6 K/uL (ref 1.7–7.7)
Neutrophils Relative %: 40 %
Platelets: 247 K/uL (ref 150–400)
RBC: 3.44 MIL/uL — ABNORMAL LOW (ref 3.87–5.11)
RDW: 12.3 % (ref 11.5–15.5)
WBC: 6.3 K/uL (ref 4.0–10.5)
nRBC: 0 % (ref 0.0–0.2)

## 2024-08-17 LAB — URINALYSIS, W/ REFLEX TO CULTURE (INFECTION SUSPECTED)
Bilirubin Urine: NEGATIVE
Glucose, UA: NEGATIVE mg/dL
Hgb urine dipstick: NEGATIVE
Ketones, ur: NEGATIVE mg/dL
Nitrite: NEGATIVE
Protein, ur: NEGATIVE mg/dL
Specific Gravity, Urine: 1.008 (ref 1.005–1.030)
pH: 5 (ref 5.0–8.0)

## 2024-08-17 NOTE — ED Provider Triage Note (Signed)
 Emergency Medicine Provider Triage Evaluation Note  Victoria Townsend , a 63 y.o. female  was evaluated in triage.  Pt complains of back pain, abdominal pain, dysuria.  Patient reports approximately 8 months of recurrent UTI.  She has been on several rounds of antibiotics, currently on Keflex.  She reports fevers in the mornings.  No current fever, vomiting.  She reports back pain.  She has seen urology.  Review of Systems  Positive: Dysuria Negative: Vomiting  Physical Exam  BP (!) 140/75   Pulse 73   Temp 97.9 F (36.6 C)   Resp 18   SpO2 95%  Gen:   Awake, no distress   Resp:  Normal effort  MSK:   Moves extremities without difficulty  Other:  Nonfocal abdominal tenderness  Medical Decision Making  Medically screening exam initiated at 2:05 PM.  Appropriate orders placed.  Victoria Townsend was informed that the remainder of the evaluation will be completed by another provider, this initial triage assessment does not replace that evaluation, and the importance of remaining in the ED until their evaluation is complete.     Victoria Chew, PA-C 08/17/24 1407

## 2024-08-17 NOTE — ED Notes (Signed)
 Pt stated she was walking to the cafe. I advised her she should eat anything until she was seen by the provider. She said she was going anyways and she would only eat something lite.

## 2024-08-17 NOTE — ED Triage Notes (Signed)
 Pt c/o flank pain and bladder pain x3 days.  Pain score 10/10.  Pt reports burning pain.  Hx of recurrent UTIs.  Pt is currently taking Keflex.   Pt reports having a hernia and having urethra stretched 10/1.

## 2024-08-18 MED ORDER — CEFPODOXIME PROXETIL 200 MG PO TABS: 200.0000 mg | ORAL_TABLET | Freq: Two times a day (BID) | ORAL | 0 refills | Status: DC

## 2024-08-18 MED ORDER — SODIUM CHLORIDE 0.9 % IV SOLN
1.0000 g | Freq: Once | INTRAVENOUS | Status: AC
  Administered 2024-08-18: 1 g via INTRAVENOUS
  Filled 2024-08-18: qty 10

## 2024-08-18 NOTE — ED Notes (Signed)
 Micro called to add-on culture

## 2024-08-18 NOTE — ED Provider Notes (Signed)
 [place] [facility] Provider Note   CSN: [id number] Arrival date & time: 08/17/24  1343     Patient presents with: Flank Pain and Bladder Pain   Victoria Townsend is a 63 y.o. female with history of recurrent UTIs, interstitial cystitis, nephrolithiasis, hypertension, presents with concern for a foul smell to her urine that has been ongoing for the past 3 to 4 days.  She also reports some dysuria.  Reports that she has been dealing with the symptoms on and off for several months.  Denies any fever or chills.  No abdominal pain, nausea or vomiting.  Reporting normal bowel movements.  She does report some left-sided lower back pain, but this is a chronic problem for her.  Previous abdominal surgeries include appendectomy, cholecystectomy, urethral stricture dilation, C-section, bilateral tubal ligation    Flank Pain       Prior to Admission medications   Medication Sig Start Date End Date Taking? Authorizing Provider  cefpodoxime  (VANTIN ) 200 MG tablet Take 1 tablet (200 mg total) by mouth 2 (two) times daily for 10 days. 08/18/24 08/28/24 Yes Veta Palma, PA-C  albuterol (VENTOLIN HFA) 108 (90 Base) MCG/ACT inhaler Inhale 2 puffs into the lungs every 6 (six) hours as needed for shortness of breath.    [provider]  ALPRAZolam SHEFFIELD) 1 MG tablet Take 1 mg by mouth 3 (three) times daily.    [provider]  aspirin 81 MG EC tablet Take 81 mg by mouth daily.    [provider]  atorvastatin (LIPITOR) 10 MG tablet Take 1 tablet by mouth daily. 02/07/22   [provider]  Blood Pressure Monitoring (OMRON 3 SERIES BP MONITOR) DEVI Use as directed 10/21/23   Alvan Dorn FALCON, MD  chlorhexidine  (PERIDEX ) 0.12 % solution Use as directed 5 mLs in the mouth or throat 4 (four) times daily.    [provider]  Elastic Bandages & Supports (MEDICAL COMPRESSION STOCKINGS) MISC 1 each by Does not apply route as  directed. Low Pressure Knee High Compression stockings Dx: leg swelling 12/12/22   Miriam Norris, NP  gabapentin (NEURONTIN) 400 MG capsule Take 400 mg by mouth 3 (three) times daily. 05/25/20   [provider]  hydrocortisone  2.5 % ointment APPLY AS DIRECTED DAILY AS NEEDED FOR ITCHING. 04/30/19   Marvis Camellia LABOR, NP  lidocaine  (LIDODERM ) 5 % Place 1 patch onto the skin every 12 (twelve) hours. 05/25/20   [provider]  lisinopril  (ZESTRIL ) 20 MG tablet Take 1 tablet (20 mg total) by mouth daily. 04/23/24   Miriam Norris, NP  naloxone [facility]) nasal spray 4 mg/0.1 mL SMARTSIG:Both Nares 03/31/24   [provider]  nitroGLYCERIN  (NITROSTAT ) 0.4 MG SL tablet DISSOLVE 1 TABLET UNDER THE TONGUE EVERY 5 MINUTES AS NEEDED FOR CHEST PAIN. DO NOT EXCEED A TOTAL OF 3 DOSES IN 15 MINUTES. 03/31/24   Debera Jayson MATSU, MD  nystatin (MYCOSTATIN) 100000 UNIT/ML suspension Take 5 mLs by mouth 4 (four) times daily. 09/01/23   [provider]  oxybutynin (DITROPAN) 5 MG tablet Take 5 mg by mouth daily.    [provider]  oxyCODONE-acetaminophen  (PERCOCET) 7.5-325 MG tablet Take 1 tablet by mouth 3 (three) times daily as needed.    [provider]  pantoprazole (PROTONIX) 40 MG tablet Take 40 mg by mouth 2 (two) times daily.    [provider]  RESTASIS 0.05 % ophthalmic emulsion Place 1 drop into both eyes 2 (two)  times daily. 04/28/20   [provider]  solifenacin  (VESICARE ) 5 MG tablet Take 1 tablet (5 mg total) by mouth daily. 06/07/24   McKenzie, Belvie CROME, MD  valACYclovir (VALTREX) 500 MG tablet Take 1 tablet by mouth daily. 08/17/21   [provider]  verapamil  (VERELAN ) 360 MG 24 hr capsule Take 1 capsule (360 mg total) by mouth at bedtime. 09/02/23   Miriam Norris, NP  vitamin C (ASCORBIC ACID) 500 MG tablet Take 500 mg by mouth daily. Patient not taking: Reported on 07/01/2024    [provider]    Allergies: Ciprofloxacin,  Codeine, Ibuprofen, Amlodipine , Bisoprolol , Lasix  [furosemide ], Ranexa  [ranolazine ], Symbicort [budesonide-formoterol fumarate], Erythromycin, Imdur  [isosorbide  nitrate], Mucinex [guaifenesin er], Nsaids, Roxicodone [oxycodone], and Shellfish allergy    Review of Systems  Genitourinary:  Positive for flank pain.    Updated Vital Signs BP 137/69 (BP Location: Right Arm)   Pulse 68   Temp 97.8 F (36.6 C) (Oral)   Resp 16   Ht 4' 10 (1.473 m)   Wt 65.3 kg   SpO2 99%   BMI 30.10 kg/m   Physical Exam Vitals and nursing note reviewed.  Constitutional:      General: She is not in acute distress.    Appearance: She is well-developed.  HENT:     Head: Normocephalic and atraumatic.  Eyes:     Conjunctiva/sclera: Conjunctivae normal.  Cardiovascular:     Rate and Rhythm: Normal rate and regular rhythm.     Heart sounds: No murmur heard. Pulmonary:     Effort: Pulmonary effort is normal. No respiratory distress.     Breath sounds: Normal breath sounds.  Abdominal:     Palpations: Abdomen is soft.     Tenderness: There is no abdominal tenderness.     Comments: Abdomen is soft and nontender, no rebound or guarding.  No CVA tenderness bilaterally  Musculoskeletal:        General: No swelling.     Cervical back: Neck supple.     Comments: Tenderness to the left gluteal muscle  Skin:    General: Skin is warm and dry.     Capillary Refill: Capillary refill takes less than 2 seconds.  Neurological:     Mental Status: She is alert.  Psychiatric:        Mood and Affect: Mood normal.     (all labs ordered are listed, but only abnormal results are displayed) Labs Reviewed  CBC WITH DIFFERENTIAL/PLATELET - Abnormal; Notable for the following components:      Result Value   RBC 3.44 (*)    Hemoglobin 10.9 (*)    HCT 34.1 (*)    All other components within normal limits  COMPREHENSIVE METABOLIC PANEL WITH GFR - Abnormal; Notable for the following components:   Glucose, Bld 103 (*)     Creatinine, Ser 1.07 (*)    Total Protein 6.0 (*)    GFR, Estimated 58 (*)    All other components within normal limits  URINALYSIS, W/ REFLEX TO CULTURE (INFECTION SUSPECTED) - Abnormal; Notable for the following components:   Color, Urine STRAW (*)    Leukocytes,Ua TRACE (*)    Bacteria, UA RARE (*)    All other components within normal limits  URINE CULTURE    EKG: None  Radiology: No results found.   Procedures   Medications Ordered in the ED  cefTRIAXone  (ROCEPHIN ) 1 g in sodium chloride  0.9 % 100 mL IVPB (0 g Intravenous Stopped 08/18/24 0250)  Medical Decision Making Amount and/or Complexity of Data Reviewed Labs: ordered.  Risk Prescription drug management.     Differential diagnosis includes but is not limited to peptic ulcer, gastritis, gastroenteritis, appendicitis, IBS, IBD, DKA, nephrolithiasis, UTI, pyelonephritis, pancreatitis, diverticulitis, mesenteric ischemia, abdominal aortic aneurysm, small bowel obstruction, volvulus    ED Course:  Upon initial evaluation, patient is very well-appearing, no acute distress.  Stable vitals.  Abdomen is soft and nontender.  No CVA tenderness to palpation bilaterally.  Does have some left gluteal muscle pain to palpation, this seems to be a chronic issue for her as she follows with a back doctor for this.  Her concern today is her urine odor.  Labs Ordered: I Ordered, and personally interpreted labs.  The pertinent results include:   CBC without leukocytosis.  Mild anemia with hemoglobin 10.9 CMP with mildly elevated creatinine at 1.07, this is up from 0.71 50-month ago Urinalysis with trace leukocytes and bacteria noted.  Appears to be a clean-catch.  No nitrites.  Medications Given: 1 g ceftriaxone   Upon re-evaluation, patient remains well appearing with stable vitals.  No concern for systemic infection given no fever, tachycardia, or leukocytosis.  Her abdomen is soft and  nontender, no leukocytosis, no elevations in LFTs, have low concern for acute intra-abdominal pathology.  She reports she is having normal bowel movements, no vomiting, no concern for SBO at this time.  No concern for nephrolithiasis as she does not have any CVA tenderness to palpation, very comfortable appearing.  No indication for CT abdomen and pelvis. She does report some left-sided back pain, but this seems reproducible with palpation of the left gluteal muscle.  I have lower concern for pyelonephritis at this time.  No CVA tenderness to palpation bilaterally.  Urinalysis does show leukocytes and bacteria present.  Will send urine off for culture and treat for pyelonephritis given she states her kidneys feel like they are on fire when she is urinating.  She has already been on Keflex without improvement in symptoms.  Will switch to course of cefpodoxime .    Impression: Urinary tract infection, possible pyelonephritis  Disposition:  The patient was discharged home with instructions to take 10-day course of cefpodoxime  as prescribed.  Urine culture sent.  Follow-up with her urologist within the next week. Return precautions given.    Record Review: External records from outside source obtained and reviewed including urology note on 06/07/2024 where she has been seen for interstitial cystitis and increased urinary frequency     This chart was dictated using voice recognition software, Dragon. Despite the best efforts of this provider to proofread and correct errors, errors may still occur which can change documentation meaning.       Final diagnoses:  Acute cystitis without hematuria    ED Discharge Orders          Ordered    cefpodoxime  (VANTIN ) 200 MG tablet  2 times daily        08/18/24 0302               Veta Palma, PA-C 08/18/24 [id number]    Mesner, Selinda, MD 08/19/24 [phone]

## 2024-08-18 NOTE — ED Notes (Signed)
 Discharge instructions reviewed.   Newly prescribed medications discussed. Pharmacy verified.   Opportunity for questions and concerns provided.   Alert, oriented and ambulatory.   Displays no signs of distress.   Encouraged to follow up with PCP as indicated.

## 2024-08-18 NOTE — Discharge Instructions (Addendum)
 You were found to have a urinary tract infection. Your urine has been sent off for culture to see what bacteria is in your urine. If your antibiotic needs to be changed, you will be contacted.  You had a slightly low hemoglobin which is one of your blood counts.  Please have this monitored by your PCP within the next month.  You also had a slightly elevated creatinine which is a measure of your kidney function.  This is likely due to dehydration.  Please keep well-hydrated and have this repeated by your PCP within the next month.  Your liver labs were normal today.  Medications: You have been prescribed an antibiotic called Cefpodoxime . Take this antibiotic 2 times a day for the next 10 days. Take the full course of your antibiotic even if you start feeling better. Antibiotics may cause you to have diarrhea.   You were given a dose of IV antibiotics here today.  Please start the oral antibiotics later today.  Follow-up instructions: Please follow-up with your urologist within the next week   Return instructions:  Please return to the Emergency Department if you: Develop confusion or become poorly responsive or faint Develop a fever above 100.74F Worsening pain You have persistent vomiting and/or are unable to keep medications down Please return if you have any other emergent concerns.

## 2024-08-19 LAB — URINE CULTURE: Culture: NO GROWTH

## 2024-08-19 NOTE — TOC CM/SW Note (Signed)
 Pt called regarding a Rx she was given yesterday and is having trouble getting filled. Per staff at [facility], the Rx for cefpodoxime  200 mg PO BID was received and later changed to Keflex. Pt states she's been on Keflex x8 months and it is ineffective. Pharmacy verified that they have cefpodoxime  in stock and will fill original Rx and deliver it to the pt. Pt updated.

## 2024-08-25 ENCOUNTER — Telehealth: Payer: Self-pay

## 2024-08-25 ENCOUNTER — Ambulatory Visit: Admitting: Urology

## 2024-08-25 VITALS — BP 184/85 | HR 73

## 2024-08-25 DIAGNOSIS — N3010 Interstitial cystitis (chronic) without hematuria: Secondary | ICD-10-CM

## 2024-08-25 DIAGNOSIS — N3592 Unspecified urethral stricture, female: Secondary | ICD-10-CM | POA: Diagnosis not present

## 2024-08-25 LAB — URINALYSIS, ROUTINE W REFLEX MICROSCOPIC
Bilirubin, UA: NEGATIVE
Glucose, UA: NEGATIVE
Ketones, UA: NEGATIVE
Leukocytes,UA: NEGATIVE
Nitrite, UA: NEGATIVE
Protein,UA: NEGATIVE
RBC, UA: NEGATIVE
Specific Gravity, UA: 1.02 (ref 1.005–1.030)
Urobilinogen, Ur: 0.2 mg/dL (ref 0.2–1.0)
pH, UA: 6 (ref 5.0–7.5)

## 2024-08-25 MED ORDER — CEPHALEXIN 500 MG PO CAPS
500.0000 mg | ORAL_CAPSULE | Freq: Once | ORAL | Status: AC
  Administered 2024-08-25: 500 mg via ORAL

## 2024-08-25 MED ORDER — CEFPODOXIME PROXETIL 200 MG PO TABS: 200.0000 mg | ORAL_TABLET | Freq: Two times a day (BID) | ORAL | 0 refills | Status: AC

## 2024-08-25 NOTE — Progress Notes (Signed)
   08/25/24  CC: urethral stricture   HPI:  Blood pressure (!) 184/85, pulse 73. NED. A&Ox3.   No respiratory distress   Abd soft, NT, ND Normal external genitalia with patent urethral meatus  Cystoscopy Procedure Note  Patient identification was confirmed, informed consent was obtained, and patient was prepped using Betadine solution.  Lidocaine  jelly was administered per urethral meatus.    Procedure: - Flexible cystoscope introduced, without any difficulty.   - Thorough search of the bladder revealed:    Urethral meatus was dilated from 8 french to 24 french    normal urothelium    no stones    no ulcers     no tumors    no urethral polyps    no trabeculation  - Ureteral orifices were normal in position and appearance.  Post-Procedure: - Patient tolerated the procedure well  Assessment/ Plan: Followup 3 months   No follow-ups on file.  Belvie Clara, MD

## 2024-08-25 NOTE — Telephone Encounter (Signed)
 Pt called b/c the cost of Rx is went from $4 to $20 to $35 pt states she spoke with someone at the the pharmacy and they told her that the price went up pt is upset and wanted to know why pt was advised sometimes medicine have a copay with insurance and that may be why pt stated she had the same prescription last month and it was only $4 pt was advised that we could try a different pharmacy or different Rx if she'd like pt stated no she does not want to change the pharmacy or Rx she wants to stay on what she knows and will just pay the $20 or $35 dollars the pharmacy told her she had to pay

## 2024-08-31 ENCOUNTER — Encounter: Payer: Self-pay | Admitting: Urology

## 2024-08-31 NOTE — Patient Instructions (Signed)
 Urethral Stricture  Urethral stricture is when the tube that drains pee (urine) from the bladder out of the body (urethra) becomes too narrow. The urethra can become narrow because of scar tissue, infection, surgery, or an injury. This can make it difficult to pee (urinate). In females, the urethra opens above the vaginal opening. In males, the urethra opens at the tip of the penis, and the urethra is much longer than it is in females. Because of the length of the female urethra, urethral stricture is much more common in males. What are the causes? In males and females, common causes of urethral stricture include: Urinary tract infection (UTI). Sexually transmitted infection (STI). Using a soft tube in the urethra to drain pee from the bladder (urinary catheter). Urinary tract surgery. In males, common causes of urethral stricture include: A severe injury to the pelvis. Prostate surgery. Injury to the penis. In many cases, the cause of urethral stricture is not known. What increases the risk? You are more likely to develop this condition if you: Are female. Males who have had prostate surgery are at risk of developing this condition. Use a urinary catheter. Have had urinary tract surgery. What are the signs or symptoms? The main symptom of this condition is trouble peeing. This may cause decreased pee flow, dribbling, or spraying of pee. Other symptom of this condition may include: Frequent UTIs. Blood in the pee. Pain when peeing. Swelling of the penis in males. Not being able to pee. How is this diagnosed? This condition may be diagnosed based on: Your medical history and a physical exam. Tests of your pee to check for infection or bleeding. X-rays. Ultrasound. Retrograde urethrogram. With this test, a dye is injected into the urethra and then an X-ray is taken. Urethroscopy. This is when a thin tube with a light and camera on the end (urethroscope) is used to look at the urethra. A  CT scan or MRI. How is this treated? This condition is treated with surgery or other procedures. The type of surgery that you have depends on the severity of your condition. You may have: Urethral dilation. In this procedure, the narrow part of the urethra is stretched open (dilated) with dilating instruments or a small balloon. Urethrotomy. In this procedure, a urethroscope is placed into the urethra, and the narrow part of the urethra is cut open with a surgical blade or laser inserted through the urethroscope. Urethroplasty. In this procedure, an incision is made in the urethra and the narrow part is removed. Then, the urethra is reconstructed. Follow these instructions at home: Take over-the-counter and prescription medicines only as told by your health care provider. If you were prescribed antibiotics, take them as told by your provider. Do not stop using the antibiotic even if you start to feel better. Drink enough fluid to keep your pee pale yellow. Keep all follow-up visits. Your provider will check your healing and adjust your treatment plan as needed. Contact a health care provider if: You have frequent peeing or you are only peeing small amounts often. You feel the need to pee urgently. You have pain or burning when you pee. Your pee smells bad or unusual. Your pee is bloody or cloudy. You have pain in your lower abdomen or back. Your genital area is swollen, bruised, or discolored. This includes: The penis, scrotum, and inner thighs for males. The outer genital organs (vulva) and inner thighs for females. You have a fever. You develop swelling in your legs. Get help  right away if: You cannot pee. You have trouble breathing. These symptoms may be an emergency. Get help right away. Call 911. Do not wait to see if the symptoms will go away. Do not drive yourself to the hospital. This information is not intended to replace advice given to you by your health care provider. Make  sure you discuss any questions you have with your health care provider. Document Revised: 09/05/2022 Document Reviewed: 09/05/2022 Elsevier Patient Education  2024 [employer].

## 2024-09-06 ENCOUNTER — Telehealth: Payer: Self-pay | Admitting: Cardiology

## 2024-09-06 NOTE — Telephone Encounter (Signed)
 Pt c/o of Chest Pain: STAT if active (IN THIS MOMENT) CP, including tightness, pressure, jaw pain, shoulder/upper arm/back pain, SOB, nausea, and vomiting.  1. Are you having CP right now (tightness, pressure, or discomfort)? No   2. Are you experiencing any other symptoms (ex. SOB, nausea, vomiting, sweating)? Numbness in her right hand in the mornings   3. How long have you been experiencing CP? 1 month   4. Is your CP continuous or coming and going? Coming and going   5. Have you taken Nitroglycerin ? Yes   6. If CP returns before callback, please consider calling 911. ?

## 2024-09-06 NOTE — Telephone Encounter (Signed)
 Seen by urgent care for congestion and told her heart was enlarged heart. I explained that imaging sometimes reports are not definitive for heart size.She uses NTG occasionally and she understands to go to the ER if she uses more than 3 NTG in 15 minutes. I offered her to open apts this week and she has pain management apt on one of the days and has no ride for the other.  She has c/o shoulder pain in her right shoulder and she sees pain management for.

## 2024-09-20 ENCOUNTER — Telehealth: Payer: Self-pay | Admitting: Cardiology

## 2024-09-20 NOTE — Telephone Encounter (Signed)
 Pt c/o BP issue: STAT if pt c/o blurred vision, one-sided weakness or slurred speech.  STAT if BP is GREATER than 180/120 TODAY.  STAT if BP is LESS than 90/60 and SYMPTOMATIC TODAY  1. What is your BP concern? Hypertension   2. Have you taken any BP medication today? yes   3. What are your last 5 BP readings?No   4. Are you having any other symptoms (ex. Dizziness, headache, blurred vision, passed out)? No   Pt has been having extremely high blood pressure. She states in the past week it's been as high as two-something over one-something. She does not have any recent readings.    Tried to transfer but patient disconnected. Please advise.

## 2024-09-21 ENCOUNTER — Telehealth: Payer: Self-pay | Admitting: Cardiology

## 2024-09-21 ENCOUNTER — Ambulatory Visit: Admitting: Gastroenterology

## 2024-09-21 NOTE — Telephone Encounter (Signed)
 Please see previous phone note for details.

## 2024-09-21 NOTE — Telephone Encounter (Signed)
 Phone encounter sent to [facility]

## 2024-09-21 NOTE — Telephone Encounter (Signed)
 Pt called in stating her pcp called in the wrong medication. She states she called them and someone told her they would call her back.SABRA

## 2024-09-21 NOTE — Telephone Encounter (Signed)
 Contacted and advised that she should use her prn nitroglycerin  for chest pain. Patient informed and verbalized understanding of plan.

## 2024-09-21 NOTE — Telephone Encounter (Signed)
 Stat call transferred with complaints from patient stating she is now having  4. Are you having any other symptoms (ex. Dizziness, headache, blurred vision, passed out)? Chest pain under the left breast off and on. Ankles swelling with soreness, hand numb      Reports hand numbness started this past week when getting out of the bed in the mornings. Reports both hands are still numb since waking up at 6:00 am this morning. Advised to contact PCP for hand numbness. Reports intermittent left side chest pain for one week rated 9/10. Denies active chest pain. Reports SOB for the past 1-2 weeks. Reports using her albuterol inhaler and her SOB improves. Reports a little swelling in her ankles since starting lisinopril . Reports intermittent headaches for a long time that are unchanged.  Reports on 09/14/2024, BP was 209/103 & 198/98 at Dr. Merlynn Bronson's office. Reports on 09/17/2024 @ Urgent Care BP was 183/121. Has not checked home BP's. Reports seeing PCP yesterday who tried to adjust BP medications but she declined and wanted her cardiologist to adjust her BP medications. Explained that she needed an evaluation in the office to adjust medications since last visit in our office was in May 2025. Encouraged patient to contact PCP to have BP managed sooner and informed that this message would be sent to PCP for continuity of care. Medications reviewed and patient is currently taking lisinopril  20 mg daily and refuses to take verapamil .  Reports she wanted to continue her cardiology care here at [facility] although she saw Dr. Thom at [facility] in September 2025. Advised to keep scheduled visit on 11/22/24 with Debera and that she has been added to the waitlist for a sooner appointment. Advised if she develops worsening symptoms, to go the ED for an evaluation. Verbalized understanding of plan.

## 2024-09-21 NOTE — Telephone Encounter (Signed)
 Pt c/o BP issue: STAT if pt c/o blurred vision, one-sided weakness or slurred speech.  STAT if BP is GREATER than 180/120 TODAY.  STAT if BP is LESS than 90/60 and SYMPTOMATIC TODAY  1. What is your BP concern? hypertension  2. Have you taken any BP medication today? yes  3. What are your last 5 BP readings? 209/103, 183/121   4. Are you having any other symptoms (ex. Dizziness, headache, blurred vision, passed out)? Chest pain under the left breast off and on. Ankles swelling with soreness, hand numb

## 2024-10-04 ENCOUNTER — Other Ambulatory Visit: Payer: Self-pay | Admitting: Nurse Practitioner

## 2024-10-06 MED ORDER — LISINOPRIL 20 MG PO TABS: 20.0000 mg | ORAL_TABLET | Freq: Every day | ORAL | 2 refills | Status: DC

## 2024-10-11 ENCOUNTER — Ambulatory Visit: Admitting: Cardiology

## 2024-10-11 ENCOUNTER — Telehealth (HOSPITAL_BASED_OUTPATIENT_CLINIC_OR_DEPARTMENT_OTHER): Payer: Self-pay

## 2024-10-11 NOTE — Telephone Encounter (Signed)
 Received a surgical clearance from [facility] for a left shoulder arthroplasty - total shoulder. Several pieces of information were left out of clearance. I called their office 3x to verify anesthesia that will be used, the surgeon's name, and if there was a date set yet. Each time I called, I would choose the physician's line but it would ring for a while then disconnect. I then called pt and LM to ask her to reach out to their office to see if she could ask them to send another surgical clearance fax with the missing information.

## 2024-10-13 NOTE — Telephone Encounter (Signed)
 1st attempt to reach patient, mailbox was full. Will try again later.

## 2024-10-13 NOTE — Telephone Encounter (Signed)
   Pre-operative Risk Assessment    Patient Name: Victoria Townsend  DOB: [birth year] MRN: [id number]   Date of last office visit: 04/05/2024 with [name], NP Date of next office visit: 11/22/2024 with Dr. Debera  Request for Surgical Clearance    Procedure:  Left shoulder arthroplasty - total shoulder  Date of Surgery:  Clearance TBD- Patient wants ASAP                                 Surgeon:  Manus Amis, DO Surgeon's Group or Practice Name:  [facility] Phone number:  [phone] Fax number:  [phone]   Type of Clearance Requested:   - Medical  - Pharmacy:  Hold Aspirin -Not specified   Type of Anesthesia:  General/Peripheral nerve block   Additional requests/questions:  None  SignedPatrcia Iverson CROME   10/13/2024, 8:10 AM

## 2024-10-13 NOTE — Telephone Encounter (Signed)
   Name: Victoria Townsend  DOB: 1961-09-29  MRN: [id number]  Primary Cardiologist: Jayson Sierras, MD   Preoperative team, please contact this patient and set up a phone call appointment for further preoperative risk assessment. Please obtain consent and complete medication review. Thank you for your help.  I confirm that guidance regarding antiplatelet and oral anticoagulation therapy has been completed and, if necessary, noted below.  Per office protocol, if patient is without any new symptoms or concerns at the time of their virtual visit,she may hold ASA for 7 days prior to procedure. Please resume ASA as soon as possible postprocedure, at the discretion of the surgeon.    I also confirmed the patient resides in the state of [place] . As per [facility] Medical Board telemedicine laws, the patient must reside in the state in which the provider is licensed.   Lamarr Satterfield, NP 10/13/2024, 8:46 AM [facility]

## 2024-10-14 ENCOUNTER — Telehealth (HOSPITAL_BASED_OUTPATIENT_CLINIC_OR_DEPARTMENT_OTHER): Payer: Self-pay | Admitting: *Deleted

## 2024-10-14 NOTE — Telephone Encounter (Signed)
 Pt has been scheduled tele preop appt 12/16/23. Med rec and consent are done.

## 2024-10-14 NOTE — Telephone Encounter (Signed)
 Pt has been scheduled tele preop appt 10/25/24. Med rec and consent are done.     Patient Consent for Virtual Visit        Victoria Townsend has provided verbal consent on 10/14/2024 for a virtual visit (video or telephone).   CONSENT FOR VIRTUAL VISIT FOR:  Victoria Townsend  By participating in this virtual visit I agree to the following:  I hereby voluntarily request, consent and authorize [facility] and its employed or contracted physicians, physician assistants, nurse practitioners or other licensed health care professionals (the Practitioner), to provide me with telemedicine health care services (the "Services) as deemed necessary by the treating Practitioner. I acknowledge and consent to receive the Services by the Practitioner via telemedicine. I understand that the telemedicine visit will involve communicating with the Practitioner through live audiovisual communication technology and the disclosure of certain medical information by electronic transmission. I acknowledge that I have been given the opportunity to request an in-person assessment or other available alternative prior to the telemedicine visit and am voluntarily participating in the telemedicine visit.  I understand that I have the right to withhold or withdraw my consent to the use of telemedicine in the course of my care at any time, without affecting my right to future care or treatment, and that the Practitioner or I may terminate the telemedicine visit at any time. I understand that I have the right to inspect all information obtained and/or recorded in the course of the telemedicine visit and may receive copies of available information for a reasonable fee.  I understand that some of the potential risks of receiving the Services via telemedicine include:  Delay or interruption in medical evaluation due to technological equipment failure or disruption; Information transmitted may not be sufficient (e.g. poor resolution  of images) to allow for appropriate medical decision making by the Practitioner; and/or  In rare instances, security protocols could fail, causing a breach of personal health information.  Furthermore, I acknowledge that it is my responsibility to provide information about my medical history, conditions and care that is complete and accurate to the best of my ability. I acknowledge that Practitioner's advice, recommendations, and/or decision may be based on factors not within their control, such as incomplete or inaccurate data provided by me or distortions of diagnostic images or specimens that may result from electronic transmissions. I understand that the practice of medicine is not an exact science and that Practitioner makes no warranties or guarantees regarding treatment outcomes. I acknowledge that a copy of this consent can be made available to me via my patient portal [facility] MyChart), or I can request a printed copy by calling the office of [facility].    I understand that my insurance will be billed for this visit.   I have read or had this consent read to me. I understand the contents of this consent, which adequately explains the benefits and risks of the Services being provided via telemedicine.  I have been provided ample opportunity to ask questions regarding this consent and the Services and have had my questions answered to my satisfaction. I give my informed consent for the services to be provided through the use of telemedicine in my medical care

## 2024-10-15 NOTE — Telephone Encounter (Signed)
  Patient said he need to reschedule her preop televisit

## 2024-10-15 NOTE — Telephone Encounter (Signed)
 Attempted to contact the pt to r/s preop tele appt and received no answer. Unable to LVM as VM box is currently full.

## 2024-10-18 NOTE — Telephone Encounter (Signed)
 S/w the pt and she said she was calling to let us  know the 10/25/24 9:20 tele appt was ok.

## 2024-10-25 ENCOUNTER — Ambulatory Visit

## 2024-10-25 NOTE — Telephone Encounter (Signed)
 S/w the pt and she says she is not going to be following [facility] card any longer. Pt sates she is following our practice. Pt has been informed she will need to be seen in office and not tele preop appt.   Pt said she needs PM appts as she has a hard time in the morning getting going. Pt said she needs to set up transportation as well. Pt generally seen in [place] office though appts available as the pt is looking to have her procedure ASAP. Pt agrees to be seen in the [place] office 11/04/24 2:30 with Laymon Qua, PAC.   I will update all parties involved.

## 2024-10-25 NOTE — Telephone Encounter (Signed)
   Patient Name: Victoria Townsend  DOB: 1961-03-21 MRN: [id number]  Primary Cardiologist: Jayson Sierras, MD  Chart reviewed as part of pre-operative protocol coverage. Patient was scheduled for preoperative telephone visit on 10/25/2024.  In reviewing chart, patient was most recently seen by [facility] cardiology in 07/2024. Stress test and echocardiogram were recommended at the time, however, I do not see results for testing and care everywhere.  If patient wishes to continue to follow with [facility] for cardiology care, I would recommend an office visit for preoperative cardiac evaluation given prior recommendations for cardiac testing. Otherwise, I recommend her cardiologist with [facility] cardiology provide clearance for upcoming shoulder surgery. I will reach out to her preoperative coverage team to cancel today's preoperative telephone visit.   Damien JAYSON Braver, NP 10/25/2024, 7:50 AM

## 2024-11-02 NOTE — Telephone Encounter (Signed)
 Pt called and ask for refill on lidocaine  patches for her shoulder and hip.

## 2024-11-04 ENCOUNTER — Encounter: Payer: Self-pay | Admitting: Student

## 2024-11-04 ENCOUNTER — Ambulatory Visit: Attending: Student | Admitting: Student

## 2024-11-04 ENCOUNTER — Encounter: Payer: Self-pay | Admitting: *Deleted

## 2024-11-04 VITALS — BP 120/80 | HR 72 | Ht <= 58 in

## 2024-11-04 DIAGNOSIS — I10 Essential (primary) hypertension: Secondary | ICD-10-CM | POA: Diagnosis present

## 2024-11-04 DIAGNOSIS — Z01810 Encounter for preprocedural cardiovascular examination: Secondary | ICD-10-CM | POA: Insufficient documentation

## 2024-11-04 DIAGNOSIS — E785 Hyperlipidemia, unspecified: Secondary | ICD-10-CM | POA: Insufficient documentation

## 2024-11-04 DIAGNOSIS — I2510 Atherosclerotic heart disease of native coronary artery without angina pectoris: Secondary | ICD-10-CM | POA: Diagnosis present

## 2024-11-04 NOTE — Progress Notes (Unsigned)
 Cardiology Office Note    Date:  11/05/2024  ID:  Victoria Townsend, DOB [birth year], MRN [id number] Cardiologist: Jayson Sierras, MD { :  History of Present Illness:    Victoria Townsend is a 63 y.o. female with past medical history of CAD (s/p Coronary CT in 04/2019 showing severe stenosis along the distal LAD and distal RCA with aggressive medical management recommended), HTN, HLD, anxiety, PTSD and history of tobacco use who presents to the office today for overdue follow-up and preoperative cardiac clearance.  She was last examined by [name], NP in 03/2024 and notes mention that she was a difficult historian and believed that she was no longer taking Lisinopril -HCTZ due to having recent UTI's. She did report episodes of occasional chest pain but this was mostly along her abdomen. A repeat NST had been offered but she declined and could not tolerate several antianginals including Amlodipine , Ranexa  and Imdur . Had also been intolerant to Bisoprolol  and was recommended if BP was not at goal at follow-up, would recommend the addition of a low-dose ARB. She was needing preoperative cardiac clearance for shoulder replacement and given that she was able to perform more than 4 METS of activity without anginal symptoms, was cleared to proceed. Was informed to follow-up in 6 months but has not been evaluated by [facility] since.  By review of Care Everywhere, she was evaluated by Dr. Thom with [facility] in 07/2024 and reported having episodes of chest pain which could occur with exertion or anxiety. Lisinopril  was switched to Chlorthalidone 50 mg daily as she was concerned about side effects regarding Lisinopril . Atorvastatin was also switched to Crestor 20 mg daily and a follow-up echocardiogram and nuclear stress test were recommended for further assessment  It does not appear that either of these were obtained.  In the interim, the office received a cardiac clearance request for left  shoulder arthroplasty. Given her evaluations by multiple providers, an office visit was recommended.  In talking with the patient today, she reports overall doing well from a cardiac perspective since her last office visit. She is able to climb 2 flights of stairs at her apartment and walks to [place] and the grocery store. She denies any recent chest pain or dyspnea on exertion with this. She last utilized nitroglycerin  over 4 months ago with no recent use. Denies any specific orthopnea, PND or pitting edema. Does experience isolated swelling along her ankles at times but symptoms resolve. Does report having extra sodium in her diet. Her biggest issue right now has been worsening pain along her left shoulder and she is looking forward to undergoing left shoulder arthroplasty in hopes that her pain will improve.  Studies Reviewed:   EKG: EKG is ordered today and demonstrates:   EKG Interpretation Date/Time:  Thursday November 04 2024 14:33:28 EST Ventricular Rate:  73 PR Interval:  132 QRS Duration:  80 QT Interval:  390 QTC Calculation: 429 R Axis:   64  Text Interpretation: Normal sinus rhythm Normal ECG When compared with ECG of 05-Apr-2024 10:38, No significant change was found Confirmed by Johnson Grate ([id number]) on 11/04/2024 2:44:58 PM       Coronary CTA: 05/2019 IMPRESSION: 1. Severe CAD in the distal RCA and proximal-distal circumflex artery, CADRADS = 4. CT FFR analysis will be performed and reported separately.   2. The patient's coronary artery calcium score is 443, which places the patient in the 60 percentile for age and sex matched control.   3. Normal  coronary origin with right dominance.  IMPRESSION: 1. CT FFR analysis demonstrates possible hemodynamically significant lesion in the distal LAD. Vessel diameter just proximal to this lesion is 2.65 mm. Consider aggressive medical management for distal CAD.  Echocardiogram: 11/2022 IMPRESSIONS     1. Left  ventricular ejection fraction, by estimation, is 65 to 70%. The  left ventricle has normal function. The left ventricle has no regional  wall motion abnormalities. Left ventricular diastolic parameters are  indeterminate. Elevated left atrial  pressure. The average left ventricular global longitudinal strain is -21.2  %. The global longitudinal strain is normal.   2. Right ventricular systolic function is normal. The right ventricular  size is normal.   3. The mitral valve is normal in structure. No evidence of mitral valve  regurgitation. No evidence of mitral stenosis.   4. The aortic valve is tricuspid. There is mild calcification of the  aortic valve. There is mild thickening of the aortic valve. Aortic valve  regurgitation is not visualized. No aortic stenosis is present.   5. The inferior vena cava is normal in size with greater than 50%  respiratory variability, suggesting right atrial pressure of 3 mmHg.   Comparison(s): Echocardiogram done 10/01/12 showed an Ef of 60-65%.    Physical Exam:   VS:  BP 120/80 (BP Location: Right Arm, Patient Position: Sitting, Cuff Size: Normal)   Pulse 72   Ht 4' 10 (1.473 m)   SpO2 97%   BMI 30.10 kg/m    Wt Readings from Last 3 Encounters:  08/17/24 144 lb (65.3 kg)  07/01/24 144 lb (65.3 kg)  04/05/24 133 lb (60.3 kg)     GEN: Well nourished, well developed female appearing in no acute distress NECK: No JVD; No carotid bruits CARDIAC: RRR, no murmurs, rubs, gallops RESPIRATORY:  Clear to auscultation without rales, wheezing or rhonchi  ABDOMEN: Appears non-distended. No obvious abdominal masses. EXTREMITIES: No clubbing or cyanosis. No pitting edema.  Distal pedal pulses are 2+ bilaterally.   Assessment and Plan:   1. Preoperative cardiovascular examination - She is able to perform more than 4 METS of activity without any anginal symptoms. RCRI risk is overall low at 1.1% risk of a major cardiac event. She does not require  further cardiac testing prior to her upcoming surgery. We typically recommend continuing ASA in the perioperative setting if able. If needing to stop, could hold for 5 days prior to surgery and resume once able. Will route today's note to the requesting provider.    2. Coronary artery disease involving native heart without angina pectoris, unspecified vessel or lesion type - Prior Coronary CT in 04/2019 showed severe stenosis along the distal LAD and distal RCA and aggressive medical management was recommended at that time. She remains active at baseline and denies any recent anginal symptoms. Continue current medical therapy with ASA 81 mg daily and Atorvastatin 10 mg daily.  3. Essential hypertension - BP is well-controlled at 120/80 during today's visit. Continue current medical therapy with Lisinopril  40 mg daily.  Creatinine was stable at 1.07 when checked in 07/2024 with K+ at 4.0.  4. Hyperlipidemia LDL goal <70 - She reports this was recently checked by her PCP and we will request a copy of most recent labs. Continue current medical therapy with Atorvastatin 10 mg daily. If LDL above goal, would further titrate to 20 mg daily.  Signed, Laymon CHRISTELLA Qua, PA-C

## 2024-11-04 NOTE — Patient Instructions (Signed)
 Medication Instructions:  Your physician recommends that you continue on your current medications as directed. Please refer to the Current Medication list given to you today. You may hold your aspirin 5 days before your surgery if its needed.  Labwork: none  Testing/Procedures: none  Follow-Up: Your physician recommends that you schedule a follow-up appointment in: 6 months with Dr. Debera.  Any Other Special Instructions Will Be Listed Below (If Applicable).  If you need a refill on your cardiac medications before your next appointment, please call your pharmacy.

## 2024-11-05 ENCOUNTER — Encounter: Payer: Self-pay | Admitting: Student

## 2024-11-09 ENCOUNTER — Ambulatory Visit: Admitting: Gastroenterology

## 2024-11-16 ENCOUNTER — Telehealth: Payer: Self-pay | Admitting: Student

## 2024-11-16 MED ORDER — LISINOPRIL 40 MG PO TABS: 40.0000 mg | ORAL_TABLET | Freq: Every day | ORAL | 3 refills | Status: AC

## 2024-11-16 NOTE — Telephone Encounter (Signed)
 Refill sent

## 2024-11-16 NOTE — Telephone Encounter (Signed)
 Pt c/o medication issue:  1. Name of Medication:   lisinopril  (ZESTRIL ) 40 MG tablet    2. How are you currently taking this medication (dosage and times per day)?   3. Are you having a reaction (difficulty breathing--STAT)? No    4. What is your medication issue? Patient calling to say her Rx was sent wrong. She is supposed to be getting 40 mg at a 90 day supply. Please advise.    *STAT* If patient is at the pharmacy, call can be transferred to refill team.   1. Which medications need to be refilled? (please list name of each medication and dose if known)   lisinopril  (ZESTRIL ) 40 MG tablet     2. Would you like to learn more about the convenience, safety, & potential cost savings by using the [facility]? No    3. Are you open to using the [facility] (Type [facility]. No    4. Which pharmacy/location (including street and city if local pharmacy) is medication to be sent to? [facility] - [place], [place] - [address]     5. Do they need a 30 day or 90 day supply? 90 dy

## 2024-11-22 ENCOUNTER — Ambulatory Visit: Admitting: Cardiology

## 2024-12-03 ENCOUNTER — Other Ambulatory Visit: Payer: Self-pay

## 2024-12-03 DIAGNOSIS — F17210 Nicotine dependence, cigarettes, uncomplicated: Secondary | ICD-10-CM

## 2024-12-03 DIAGNOSIS — Z87891 Personal history of nicotine dependence: Secondary | ICD-10-CM

## 2024-12-03 DIAGNOSIS — Z122 Encounter for screening for malignant neoplasm of respiratory organs: Secondary | ICD-10-CM

## 2024-12-09 ENCOUNTER — Ambulatory Visit: Admitting: Gastroenterology

## 2024-12-09 NOTE — Progress Notes (Unsigned)
 [address]  Victoria Townsend [id number] Aug 19, 1961   Chief Complaint:  Referring Provider: Alston Silvio BROCKS, FNP Primary GI MD: Victoria Townsend  HPI: Victoria Townsend is a 64 y.o. female with past medical history of anxiety/depression, asthma, CAD, COPD, HTN, GERD, fibromyalgia, colon polyps, appendectomy, cholecystectomy who presents today for a complaint of abdominal pain.     09/02/2019 patient seen in clinic by Victoria Shawl, NP for follow-up with right upper quadrant abdominal pain.  She had not had any pain since her last visit.  That time was noted she had chronic right upper quadrant pain with a normal EGD 3/19 as well as CT of the abdomen pelvis without identifying cause, abdominal Doppler/duplex normal, status postcholecystectomy.  Is felt possibly this pain is from adhesions from prior surgery or internal hernia.  This had improved at that visit.  She was continued on Dicyclomine  10 mg twice daily.  Her reflux was stable.  Her colonoscopy was recommended to be repeated in March 2024 given a family history of colon cancer.     10/19/2019 MRI of the abdomen with and without was image degraded by motion artifact, no mass or other significant abnormality.     02/07/2020 patient seen by digestive health for upper abdominal pain.  She described pain for 8 years which she thought may be related to scar tissue on the inside, described intermittent pain that felt like a knot, this is exacerbated by bending over and with movement in general.  Patient had previously being given a trial of Amitriptyline and a referral to pain management was recommended however she had not tried either of these as she had not seen help from Elavil in the past and had previously followed with pain management.  At that time was recommended the patient trial Voltaren gel has a large component of her symptoms was musculoskeletal.  She was referred to pain management for consideration of trigger point injections.     08/28/21  patient seen in clinic and had the same complaint of chronic right upper quadrant pain.  She was on Pantoprazole 40B ID which took care of her reflux pain.  She tried abdominal wall injections and Elavil in the past which did not work.  Described pain as a 9-10/10 sharp and burning.  At that point discussed repeating EGD.  Scheduled in the [facility].  Also discussed rescheduling her pain injections if nothing was found.     10/10/2021 EGD with Z-line irregular at 36 cm from the incisors, gastritis, small hiatal hernia and otherwise normal.  Pathology with reactive gastropathy.  Was recommended she abdominal wall injections.     06/18/2022 CT renal stone study showed tiny bilateral renal calculi.  Colonic diverticulosis.     09/24/2023 CT of the chest abdomen pelvis with contrast showed no evidence of traumatic injury, bilateral nephrolithiasis and colonic diverticulosis.     05/12/2024 CBC with a hemoglobin of 10.7, MCV normal.  CMP normal.  Magnesium normal.     05/17/2024 BMP with a potassium minimally elevated at 5.2 and otherwise normal.     05/17/2024 renal ultrasound bilateral nonobstructing nephrolithiasis.    7/25 urinalysis normal.  Patient last seen in office 07/01/2024 by Victoria Failing, PA for follow-up of chronic RUQ pain.  Previous extensive workup about 3 years ago including EGD, MRI, CT, ultrasound.  Patient is s/p cholecystectomy in the past and has been thought to have psychosomatic pain/adhesions and abdominal wall injections were recommended but never pursued by the patient.  No change  with twice daily PPI.  Noted to be due for colonoscopy March 2026. Long discussion was had with patient and she was adamant that pain had worsened over the past year.  Plan was to get a CT scan of the abdomen and pelvis and repeat a BMP to check kidney function.  Pending results consider another repeat EGD patient on oxycodone 4 times daily which did not help with pain.  It was again discussed regarding the  possibility of abdominal wall injections.  BMP with normal BUN and creatinine.  No explanation for pain on CT scan.  Patient was upset by these results and wanted to have a repeat CT scan.  Patient advised to follow-up with Victoria Townsend to discuss further, or refer to sports medicine physician for further discussion of pain and different type of evaluation.  Patient was offered an appointment with Victoria Townsend which she declined.  She has canceled appointment on 08/11/24 with Victoria Townsend, 10/28 with Victoria Zehr, PA-C, and myself on 12/16.  Discussed the use of AI scribe software for clinical note transcription with the patient, who gave verbal consent to proceed.  History of Present Illness       Previous GI Procedures/Imaging   CT abdomen and pelvis with contrast 07/05/2019: Two nonobstructing bilateral renal calculi measuring up to 3 mm. No Hydronephrosis.  Status post cholecystectomy and appendectomy. No evidence of bowel obstruction.  No CT findings to account for the patient's chronic upper abdominal pain.   EGD 02/08/2018: - Normal esophagus. Dilated with a Maloney dilator with mild resistance at 56 Fr. - Normal stomach. - Normal duodenal bulb and second portion of the duodenum. - No specimens collected.   EGD 08/09/2013: small hiatal hernia and a few 1-10mm gastric hyperplastic polyps. No H. Pylori.   Colonoscopy 02/08/2018: Adequate prep - Internal hemorrhoids. - One 4 mm tubular adenomatous polyp at the splenic flexure removed. - Diverticulosis in the sigmoid colon and in the descending colon. -71yr recall.    Past Medical History:  Diagnosis Date   Agoraphobia    Anxiety disorder    Arthritis    Asthma    Bladder polyps    CAD (coronary artery disease)    Cardiac CTA June 2020   Colon polyps    COPD (chronic obstructive pulmonary disease) (HCC)    Depression    Emphysema lung (HCC)    Essential hypertension    Fibromyalgia    Gallstones    GERD  (gastroesophageal reflux disease)    Herpes    Irregular heartbeat    Nephrolithiasis    Polysubstance abuse (HCC)    History of cocaine, alcohol, marijuana in the past   PTSD (post-traumatic stress disorder)    Shoulder pain    Shoulder has been injected    Past Surgical History:  Procedure Laterality Date   APPENDECTOMY     Bladder polyps     CESAREAN SECTION WITH BILATERAL TUBAL LIGATION     CHOLECYSTECTOMY     COLONOSCOPY  03/2011   Dr. Tobie: scattered diverticula pancolonic   COLONOSCOPY WITH PROPOFOL  N/A 01/29/2018   Procedure: COLONOSCOPY WITH PROPOFOL ;  Surgeon: Shaaron Lamar HERO, MD;  Location: [facility];  Service: Endoscopy;  Laterality: N/A;   ESOPHAGOGASTRODUODENOSCOPY  02/2011   Dr. Tobie: gastritis   ESOPHAGOGASTRODUODENOSCOPY N/A 08/09/2013   MFM:Hjdumpr polyps/abnormal gastric because of uncertain significance-status post biopsy. Small hiatal [email or web address] chronic inflammation on bx. hyperplastic polyp   ESOPHAGOGASTRODUODENOSCOPY (EGD) WITH PROPOFOL  N/A 01/29/2018   Procedure: ESOPHAGOGASTRODUODENOSCOPY (EGD)  WITH PROPOFOL ;  Surgeon: Shaaron Lamar HERO, MD;  Location: [facility];  Service: Endoscopy;  Laterality: N/A;  1:30   MALONEY DILATION N/A 01/29/2018   Procedure: AGAPITO DILATION;  Surgeon: Shaaron Lamar HERO, MD;  Location: [facility];  Service: Endoscopy;  Laterality: N/A;   URETHRAL STRICTURE DILATATION     WRIST FRACTURE SURGERY Right     Current Outpatient Medications  Medication Sig Dispense Refill   albuterol (VENTOLIN HFA) 108 (90 Base) MCG/ACT inhaler Inhale 2 puffs into the lungs every 4 (four) hours as needed for shortness of breath.     ALPRAZolam (XANAX) 1 MG tablet Take 1 mg by mouth 3 (three) times daily.     aspirin 81 MG EC tablet Take 81 mg by mouth daily.     atorvastatin (LIPITOR) 10 MG tablet Take 1 tablet by mouth daily.     Blood Pressure Monitoring (OMRON 3 SERIES BP MONITOR) DEVI Use as directed 1 each 0   chlorhexidine   (PERIDEX ) 0.12 % solution Use as directed 5 mLs in the mouth or throat 4 (four) times daily.     cyclobenzaprine (FLEXERIL) 5 MG tablet Take 5 mg by mouth 3 (three) times daily as needed.     Elastic Bandages & Supports (MEDICAL COMPRESSION STOCKINGS) MISC 1 each by Does not apply route as directed. Low Pressure Knee High Compression stockings Dx: leg swelling 1 each 0   gabapentin (NEURONTIN) 400 MG capsule Take 400 mg by mouth 3 (three) times daily.     hydrocortisone  2.5 % ointment APPLY AS DIRECTED DAILY AS NEEDED FOR ITCHING. 28.35 g 2   lidocaine  (LIDODERM ) 5 % Place 1 patch onto the skin every 12 (twelve) hours.     lisinopril  (ZESTRIL ) 40 MG tablet Take 1 tablet (40 mg total) by mouth daily. 90 tablet 3   naloxone (NARCAN) nasal spray 4 mg/0.1 mL SMARTSIG:Both Nares     nitroGLYCERIN  (NITROSTAT ) 0.4 MG SL tablet DISSOLVE 1 TABLET UNDER THE TONGUE EVERY 5 MINUTES AS NEEDED FOR CHEST PAIN. DO NOT EXCEED A TOTAL OF 3 DOSES IN 15 MINUTES. 25 tablet 3   nystatin (MYCOSTATIN) 100000 UNIT/ML suspension Take 5 mLs by mouth 4 (four) times daily.     oxybutynin (DITROPAN) 5 MG tablet Take 5 mg by mouth daily. (Patient not taking: Reported on 11/04/2024)     oxyCODONE-acetaminophen  (PERCOCET) 7.5-325 MG tablet Take 1 tablet by mouth 3 (three) times daily as needed.     pantoprazole (PROTONIX) 40 MG tablet Take 40 mg by mouth 2 (two) times daily.     polyethylene glycol powder (GLYCOLAX/MIRALAX) 17 GM/SCOOP powder Take 17 g by mouth 2 (two) times daily.     RESTASIS 0.05 % ophthalmic emulsion Place 1 drop into both eyes 2 (two) times daily. (Patient not taking: Reported on 11/04/2024)     solifenacin  (VESICARE ) 5 MG tablet Take 1 tablet (5 mg total) by mouth daily. 30 tablet 11   valACYclovir (VALTREX) 500 MG tablet Take 1 tablet by mouth daily.     No current facility-administered medications for this visit.    Allergies as of 12/09/2024 - Review Complete 11/05/2024  Allergen Reaction Noted    Ciprofloxacin Swelling and Other (See Comments) 04/12/2013   Codeine Nausea And Vomiting    Ibuprofen Other (See Comments)    Amlodipine   12/18/2022   Bisoprolol  Other (See Comments) 12/18/2022   Lasix  [furosemide ] Other (See Comments) 12/18/2022   Ranexa  [ranolazine ]  12/06/2022   Symbicort [budesonide-formoterol fumarate] Other (See  Comments) 06/21/2020   Erythromycin Other (See Comments) 08/16/2014   Imdur  [isosorbide  nitrate] Other (See Comments) 06/22/2019   Mucinex [guaifenesin er] Nausea And Vomiting 01/21/2018   Nsaids Other (See Comments) 12/31/2021   Roxicodone [oxycodone] Swelling 01/21/2018   Shellfish allergy Rash 04/12/2013    Family History  Problem Relation Age of Onset   Coronary artery disease Mother        CABG   Diabetes Mother    Heart attack Mother    Liver cancer Sister    Alcohol abuse Sister    Drug abuse Sister    Clotting disorder Sister    Alcohol abuse Brother    Drug abuse Brother    Diabetes Brother    Diabetes Brother    Heart disease Brother    Obesity Brother    Alcohol abuse Brother    Drug abuse Brother    Stomach cancer Paternal Grandmother 74   Stomach cancer Maternal Aunt 60   Ovarian cancer Maternal Aunt    Breast cancer Paternal Aunt    Colon cancer Paternal Uncle 80   Fibromyalgia Daughter    Thyroid disease Daughter    Anxiety disorder Daughter     Social History[1]   Review of Systems:    Constitutional: No weight loss, fever, chills, weakness or fatigue Eyes: No change in vision Ears, Nose, Throat:  No change in hearing or congestion Skin: No rash or itching Cardiovascular: No chest pain, chest pressure or palpitations   Respiratory: No SOB or cough Gastrointestinal: See HPI and otherwise negative Genitourinary: No dysuria or change in urinary frequency Neurological: No headache, dizziness or syncope Musculoskeletal: No new muscle or joint pain Hematologic: No bleeding or bruising    Physical Exam:  Vital  signs: There were no vitals taken for this visit.  Wt Readings from Last 3 Encounters:  08/17/24 144 lb (65.3 kg)  07/01/24 144 lb (65.3 kg)  04/05/24 133 lb (60.3 kg)     Constitutional: NAD, Well developed, Well nourished, alert and cooperative Head:  Normocephalic and atraumatic.  Eyes: No scleral icterus. Conjunctiva pink. Mouth: No oral lesions. Respiratory: Respirations even and unlabored. Lungs clear to auscultation bilaterally.  No wheezes, crackles, or rhonchi.  Cardiovascular:  Regular rate and rhythm. No murmurs. No peripheral edema. Gastrointestinal:  Soft, nondistended, nontender. No rebound or guarding. Normal bowel sounds. No appreciable masses or hepatomegaly. Rectal:  Not performed.  Neurologic:  Alert and oriented x4;  grossly normal neurologically.  Skin:   Dry and intact without significant lesions or rashes. Psychiatric: Oriented to person, place and time. Demonstrates good judgement and reason without abnormal affect or behaviors.      Assessment/Plan:   Assessment & Plan    Schedule colonoscopy to be done in March Follow up with Victoria Townsend afterwards   Camie Furbish, PA-C  Gastroenterology 12/09/2024, 12:07 PM  Patient Care Team: Victoria Silvio BROCKS, FNP as PCP - General (Family Medicine) Debera Jayson MATSU, MD as PCP - Cardiology (Cardiology) Shaaron Lamar HERO, MD as Attending Physician (Gastroenterology) Darlean Ozell NOVAK, MD as Consulting Physician (Pulmonary Disease)      [1]  Social History Tobacco Use   Smoking status: Former    Average packs/day: 0.5 packs/day for 49.7 years (24.9 ttl pk-yrs)    Types: Cigarettes    Start date: 06/10/1974   Smokeless tobacco: Never  Vaping Use   Vaping status: Never Used  Substance Use Topics   Alcohol use: No    Alcohol/week: 0.0 standard drinks of  alcohol    Comment: Former user, heavy etoh use prior to 1990s.   Drug use: Not Currently    Types: Marijuana, Cocaine    Comment: 04/13/19-no cocaine;  denies marijuana   "

## 2024-12-17 ENCOUNTER — Telehealth: Payer: Self-pay | Admitting: Cardiology

## 2024-12-17 NOTE — Telephone Encounter (Signed)
 Reports intermittent chest pain for several years and is requesting to have a heart catheterization. Reports she has chest pain three times per month on the average. Reports her chest pain intensity symptoms are worse than before. Reports she had chest pain on yesterday rated 8/10 and took nitroglycerin  x's 1, and felt relief. Reports she has SOB from emphysema that seems to be getting worse. Denies active chest pain. Denies dizziness.Advised that she needed an ED evaluation with the symptoms she is describing. First available appointment scheduled with Debera (per patient request) on 01/19/2025. ER precautions advised for returning or worsening symptoms.

## 2024-12-17 NOTE — Telephone Encounter (Signed)
 Pt called in asking to speak to nurse about possible having a cath.. please advise

## 2024-12-29 ENCOUNTER — Ambulatory Visit: Admitting: Urology

## 2024-12-29 VITALS — BP 136/74 | HR 74

## 2024-12-29 DIAGNOSIS — R3912 Poor urinary stream: Secondary | ICD-10-CM

## 2024-12-29 DIAGNOSIS — N3010 Interstitial cystitis (chronic) without hematuria: Secondary | ICD-10-CM

## 2024-12-29 LAB — URINALYSIS, ROUTINE W REFLEX MICROSCOPIC
Bilirubin, UA: NEGATIVE
Glucose, UA: NEGATIVE
Ketones, UA: NEGATIVE
Leukocytes,UA: NEGATIVE
Nitrite, UA: NEGATIVE
Protein,UA: NEGATIVE
RBC, UA: NEGATIVE
Specific Gravity, UA: 1.02 (ref 1.005–1.030)
Urobilinogen, Ur: 0.2 mg/dL (ref 0.2–1.0)
pH, UA: 6 (ref 5.0–7.5)

## 2024-12-29 NOTE — Progress Notes (Unsigned)
 "  12/29/2024 1:10 PM   Victoria Townsend 13-Sep-1961 [id number]  Referring provider: Alston Silvio BROCKS, FNP [address] [place],  [place] [address]  Followup weak urinary stream   HPI: Victoria Townsend is a 63yo here for followup for weak urinary stream and IC. She had been doing well until end of December when she developed bronchitis which she is on prednisone  and augmentin. Her urine stream is weaker in the past 3-4 weeks. The stream is intermittently weak. NO dysuria or gross hematuria.    PMH: Past Medical History:  Diagnosis Date   Agoraphobia    Anxiety disorder    Arthritis    Asthma    Bladder polyps    CAD (coronary artery disease)    Cardiac CTA June 2020   Colon polyps    COPD (chronic obstructive pulmonary disease) (HCC)    Depression    Emphysema lung (HCC)    Essential hypertension    Fibromyalgia    Gallstones    GERD (gastroesophageal reflux disease)    Herpes    Irregular heartbeat    Nephrolithiasis    Polysubstance abuse (HCC)    History of cocaine, alcohol, marijuana in the past   PTSD (post-traumatic stress disorder)    Shoulder pain    Shoulder has been injected    Surgical History: Past Surgical History:  Procedure Laterality Date   APPENDECTOMY     Bladder polyps     CESAREAN SECTION WITH BILATERAL TUBAL LIGATION     CHOLECYSTECTOMY     COLONOSCOPY  03/2011   Dr. Tobie: scattered diverticula pancolonic   COLONOSCOPY WITH PROPOFOL  N/A 01/29/2018   Procedure: COLONOSCOPY WITH PROPOFOL ;  Surgeon: Shaaron Lamar HERO, MD;  [facility]: [facility];  Service: Endoscopy;  Laterality: N/A;   ESOPHAGOGASTRODUODENOSCOPY  02/2011   Dr. Tobie: gastritis   ESOPHAGOGASTRODUODENOSCOPY N/A 08/09/2013   MFM:Hjdumpr polyps/abnormal gastric because of uncertain significance-status post biopsy. Small hiatal [email or web address] chronic inflammation on bx. hyperplastic polyp   ESOPHAGOGASTRODUODENOSCOPY (EGD) WITH PROPOFOL  N/A 01/29/2018   Procedure: ESOPHAGOGASTRODUODENOSCOPY (EGD)  WITH PROPOFOL ;  Surgeon: Shaaron Lamar HERO, MD;  Location: [facility];  Service: Endoscopy;  Laterality: N/A;  1:30   MALONEY DILATION N/A 01/29/2018   Procedure: AGAPITO DILATION;  Surgeon: Shaaron Lamar HERO, MD;  Location: [facility];  Service: Endoscopy;  Laterality: N/A;   URETHRAL STRICTURE DILATATION     WRIST FRACTURE SURGERY Right     Home Medications:  Allergies as of 12/29/2024       Reactions   Ciprofloxacin Swelling, Other (See Comments)   Facial swelling   Codeine Nausea And Vomiting   Ibuprofen Other (See Comments)   Oral cavity burning sensations   Amlodipine     Leg edema   Bisoprolol  Other (See Comments)   Bradycardia   Lasix  [furosemide ] Other (See Comments)   Cannot tolerate this.    Ranexa  [ranolazine ]    Symbicort [budesonide-formoterol Fumarate] Other (See Comments)   Took breath away   Erythromycin Other (See Comments)   Facial swelling   Imdur  [isosorbide  Nitrate] Other (See Comments)   Headaches   Mucinex [guaifenesin Er] Nausea And Vomiting   Nsaids Other (See Comments)   She reports she does not take.    Roxicodone [oxycodone] Swelling   Mouth swelling   Shellfish Allergy Rash        Medication List        Accurate as of December 29, 2024  1:10 PM. If you have any questions,  ask your nurse or doctor.          albuterol 108 (90 Base) MCG/ACT inhaler Commonly known as: VENTOLIN HFA Inhale 2 puffs into the lungs every 4 (four) hours as needed for shortness of breath.   ALPRAZolam 1 MG tablet Commonly known as: XANAX Take 1 mg by mouth 3 (three) times daily.   aspirin EC 81 MG tablet Take 81 mg by mouth daily.   atorvastatin 10 MG tablet Commonly known as: LIPITOR Take 1 tablet by mouth daily.   chlorhexidine  0.12 % solution Commonly known as: PERIDEX  Use as directed 5 mLs in the mouth or throat 4 (four) times daily.   cyclobenzaprine 5 MG tablet Commonly known as: FLEXERIL Take 5 mg by mouth 3 (three) times daily as  needed.   gabapentin 400 MG capsule Commonly known as: NEURONTIN Take 400 mg by mouth 3 (three) times daily.   hydrocortisone  2.5 % ointment APPLY AS DIRECTED DAILY AS NEEDED FOR ITCHING.   lidocaine  5 % Commonly known as: LIDODERM  Place 1 patch onto the skin every 12 (twelve) hours.   lisinopril  40 MG tablet Commonly known as: ZESTRIL  Take 1 tablet (40 mg total) by mouth daily.   Medical Compression Stockings Misc 1 each by Does not apply route as directed. Low Pressure Knee High Compression stockings Dx: leg swelling   naloxone 4 MG/0.1ML Liqd nasal spray kit Commonly known as: NARCAN SMARTSIG:Both Nares   nitroGLYCERIN  0.4 MG SL tablet Commonly known as: NITROSTAT  DISSOLVE 1 TABLET UNDER THE TONGUE EVERY 5 MINUTES AS NEEDED FOR CHEST PAIN. DO NOT EXCEED A TOTAL OF 3 DOSES IN 15 MINUTES.   nystatin 100000 UNIT/ML suspension Commonly known as: MYCOSTATIN Take 5 mLs by mouth 4 (four) times daily.   Omron 3 Series BP Monitor Devi Use as directed   oxybutynin 5 MG tablet Commonly known as: DITROPAN Take 5 mg by mouth daily.   oxyCODONE-acetaminophen  7.5-325 MG tablet Commonly known as: PERCOCET Take 1 tablet by mouth 3 (three) times daily as needed.   pantoprazole 40 MG tablet Commonly known as: PROTONIX Take 40 mg by mouth 2 (two) times daily.   polyethylene glycol powder 17 GM/SCOOP powder Commonly known as: GLYCOLAX/MIRALAX Take 17 g by mouth 2 (two) times daily.   Restasis 0.05 % ophthalmic emulsion Generic drug: cycloSPORINE Place 1 drop into both eyes 2 (two) times daily.   solifenacin  5 MG tablet Commonly known as: VESICARE  Take 1 tablet (5 mg total) by mouth daily.   valACYclovir 500 MG tablet Commonly known as: VALTREX Take 1 tablet by mouth daily.        Allergies: Allergies[1]  Family History: Family History  Problem Relation Age of Onset   Coronary artery disease Mother        CABG   Diabetes Mother    Heart attack Mother    Liver  cancer Sister    Alcohol abuse Sister    Drug abuse Sister    Clotting disorder Sister    Alcohol abuse Brother    Drug abuse Brother    Diabetes Brother    Diabetes Brother    Heart disease Brother    Obesity Brother    Alcohol abuse Brother    Drug abuse Brother    Stomach cancer Paternal Grandmother 74   Stomach cancer Maternal Aunt 60   Ovarian cancer Maternal Aunt    Breast cancer Paternal Aunt    Colon cancer Paternal Uncle 85   Fibromyalgia Daughter  Thyroid disease Daughter    Anxiety disorder Daughter     Social History:  reports that she has quit smoking. Her smoking use included cigarettes. She started smoking about 50 years ago. She has a 24.9 pack-year smoking history. She has never used smokeless tobacco. She reports that she does not currently use drugs after having used the following drugs: Marijuana and Cocaine. She reports that she does not drink alcohol.  ROS: All other review of systems were reviewed and are negative except what is noted above in HPI  Physical Exam: BP 136/74   Pulse 74   Constitutional:  Alert and oriented, No acute distress. HEENT: [place] AT, moist mucus membranes.  Trachea midline, no masses. Cardiovascular: No clubbing, cyanosis, or edema. Respiratory: Normal respiratory effort, no increased work of breathing. GI: Abdomen is soft, nontender, nondistended, no abdominal masses GU: No CVA tenderness.  Lymph: No cervical or inguinal lymphadenopathy. Skin: No rashes, bruises or suspicious lesions. Neurologic: Grossly intact, no focal deficits, moving all 4 extremities. Psychiatric: Normal mood and affect.  Laboratory Data: Lab Results  Component Value Date   WBC 6.3 08/17/2024   HGB 10.9 (L) 08/17/2024   HCT 34.1 (L) 08/17/2024   MCV 99.1 08/17/2024   PLT 247 08/17/2024    Lab Results  Component Value Date   CREATININE 1.07 (H) 08/17/2024    No results found for: PSA  No results found for: TESTOSTERONE  No results found  for: HGBA1C  Urinalysis    Component Value Date/Time   COLORURINE STRAW (A) 08/17/2024 1443   APPEARANCEUR Clear 08/25/2024 1336   LABSPEC 1.008 08/17/2024 1443   PHURINE 5.0 08/17/2024 1443   GLUCOSEU Negative 08/25/2024 1336   HGBUR NEGATIVE 08/17/2024 1443   BILIRUBINUR Negative 08/25/2024 1336   KETONESUR NEGATIVE 08/17/2024 1443   PROTEINUR Negative 08/25/2024 1336   PROTEINUR NEGATIVE 08/17/2024 1443   UROBILINOGEN 0.2 08/17/2013 0315   NITRITE Negative 08/25/2024 1336   NITRITE NEGATIVE 08/17/2024 1443   LEUKOCYTESUR Negative 08/25/2024 1336   LEUKOCYTESUR TRACE (A) 08/17/2024 1443    Lab Results  Component Value Date   LABMICR Comment 08/25/2024   WBCUA 11-30 (A) 08/02/2024   LABEPIT 0-10 08/02/2024   BACTERIA RARE (A) 08/17/2024    Pertinent Imaging: *** No results found for this or any previous visit.  No results found for this or any previous visit.  No results found for this or any previous visit.  No results found for this or any previous visit.  No results found for this or any previous visit.  No results found for this or any previous visit.  No results found for this or any previous visit.  Results for orders placed during the hospital encounter of 06/18/22  CT RENAL STONE STUDY  Narrative CLINICAL DATA:  Bilateral flank pain for 6 months.  Nephrolithiasis.  EXAM: CT ABDOMEN AND PELVIS WITHOUT CONTRAST  TECHNIQUE: Multidetector CT imaging of the abdomen and pelvis was performed following the standard protocol without IV contrast.  RADIATION DOSE REDUCTION: This exam was performed according to the departmental dose-optimization program which includes automated exposure control, adjustment of the mA and/or kV according to patient size and/or use of iterative reconstruction technique.  COMPARISON:  05/16/2021  FINDINGS: Lower chest: No acute findings.  Hepatobiliary: No mass visualized on this unenhanced exam.  Prior cholecystectomy. No evidence of biliary obstruction.  Pancreas: No mass or inflammatory process visualized on this unenhanced exam.  Spleen:  Within normal limits in size.  Adrenals/Urinary tract: Several  tiny less than 5 mm renal calculi are seen bilaterally. No evidence of ureteral calculi or hydronephrosis. Unremarkable unopacified urinary bladder.  Stomach/Bowel: No evidence of obstruction, inflammatory process, or abnormal fluid collections. Diffuse colonic diverticulosis is again noted, there is no evidence of diverticulitis.  Vascular/Lymphatic: No pathologically enlarged lymph nodes identified. No evidence of abdominal aortic aneurysm. Aortic atherosclerotic calcification incidentally noted.  Reproductive:  No mass or other significant abnormality.  Other:  None.  Musculoskeletal:  No suspicious bone lesions identified.  IMPRESSION: Tiny bilateral renal calculi. No evidence of ureteral calculi, hydronephrosis, or other acute findings.  Colonic diverticulosis, without radiographic evidence of diverticulitis.   Electronically Signed By: Norleen DELENA Kil M.D. On: 06/18/2022 17:32   Assessment & Plan:    1. IC (interstitial cystitis) (Primary) *** - Urinalysis, Routine w reflex microscopic   No follow-ups on file.  Victoria Clara, MD  [facility] [place]      [1]  Allergies Allergen Reactions   Ciprofloxacin Swelling and Other (See Comments)    Facial swelling   Codeine Nausea And Vomiting   Ibuprofen Other (See Comments)    Oral cavity burning sensations   Amlodipine      Leg edema   Bisoprolol  Other (See Comments)    Bradycardia   Lasix  [Furosemide ] Other (See Comments)    Cannot tolerate this.    Ranexa  [Ranolazine ]    Symbicort [Budesonide-Formoterol Fumarate] Other (See Comments)    Took breath away   Erythromycin Other (See Comments)    Facial swelling   Imdur  [Isosorbide  Nitrate] Other (See Comments)    Headaches    Mucinex [Guaifenesin Er] Nausea And Vomiting   Nsaids Other (See Comments)    She reports she does not take.    Roxicodone [Oxycodone] Swelling    Mouth swelling   Shellfish Allergy Rash   "

## 2025-01-17 ENCOUNTER — Ambulatory Visit (HOSPITAL_COMMUNITY)

## 2025-01-19 ENCOUNTER — Ambulatory Visit: Admitting: Cardiology

## 2025-03-31 ENCOUNTER — Ambulatory Visit: Admitting: Nurse Practitioner

## 2025-07-13 ENCOUNTER — Ambulatory Visit: Admitting: Urology
